# Patient Record
Sex: Female | Born: 1975 | Race: Black or African American | Hispanic: No | Marital: Single | Smoking: Former smoker
Health system: Southern US, Community
[De-identification: ages and names within clinical notes are randomized; demographics above are authoritative.]

## PROBLEM LIST (undated history)

## (undated) DIAGNOSIS — E876 Hypokalemia: Secondary | ICD-10-CM

## (undated) DIAGNOSIS — D509 Iron deficiency anemia, unspecified: Secondary | ICD-10-CM

## (undated) DIAGNOSIS — F419 Anxiety disorder, unspecified: Secondary | ICD-10-CM

## (undated) DIAGNOSIS — Z8489 Family history of other specified conditions: Secondary | ICD-10-CM

## (undated) DIAGNOSIS — C539 Malignant neoplasm of cervix uteri, unspecified: Secondary | ICD-10-CM

## (undated) HISTORY — DX: Malignant neoplasm of cervix uteri, unspecified: C53.9

## (undated) HISTORY — PX: OTHER SURGICAL HISTORY: SHX169

## (undated) HISTORY — PX: TUBAL LIGATION: SHX77

---

## 2014-02-15 ENCOUNTER — Encounter: Payer: Self-pay | Admitting: Gynecologic Oncology

## 2014-02-15 ENCOUNTER — Ambulatory Visit: Payer: Medicaid - Out of State | Attending: Gynecologic Oncology | Admitting: Gynecologic Oncology

## 2014-02-15 VITALS — BP 103/59 | HR 84 | Temp 98.0°F | Resp 16 | Ht 62.0 in | Wt 102.9 lb

## 2014-02-15 DIAGNOSIS — Z87891 Personal history of nicotine dependence: Secondary | ICD-10-CM | POA: Diagnosis not present

## 2014-02-15 DIAGNOSIS — D069 Carcinoma in situ of cervix, unspecified: Secondary | ICD-10-CM

## 2014-02-15 DIAGNOSIS — C539 Malignant neoplasm of cervix uteri, unspecified: Secondary | ICD-10-CM

## 2014-02-15 DIAGNOSIS — R87613 High grade squamous intraepithelial lesion on cytologic smear of cervix (HGSIL): Secondary | ICD-10-CM | POA: Diagnosis present

## 2014-02-15 DIAGNOSIS — Z793 Long term (current) use of hormonal contraceptives: Secondary | ICD-10-CM | POA: Diagnosis not present

## 2014-02-15 DIAGNOSIS — D495 Neoplasm of unspecified behavior of other genitourinary organs: Secondary | ICD-10-CM | POA: Diagnosis not present

## 2014-02-15 DIAGNOSIS — F1290 Cannabis use, unspecified, uncomplicated: Secondary | ICD-10-CM | POA: Insufficient documentation

## 2014-02-15 NOTE — Patient Instructions (Addendum)
You are scheduled for a PET scan on 02/25/14 at 12pm at [facility]. Please arrive at 11:45am. Nothing to eat or drink after 6am on the morning of 02/25/14.   You will receive a phone call from the [facility] to arrange an appointment to see Dr. Marko Plume who would administer chemotherapy.  We will notify you with the results of your biopsy and PET scan.  Please call for any questions or concerns.  Plan to see Dr. Sondra Come in Radiation on Wed, Jan 20 at 1:30pm.  Arrive at 1:15pm to register.

## 2014-02-15 NOTE — Progress Notes (Signed)
Consult Note: Gyn-Onc  Consult was requested by Dr. Evie Lacks for the evaluation of Erika [name] 39 y.o. female with vaginal bleeding and a cervical mass  CC:  Chief Complaint  Patient presents with  . severe cervical dysplasia    Assessment/Plan:  [name]  is a 39 y.o.  year old with apparent stage IIB cervical cancer.  We are awaiting the confirmatory biopsies that I performed today. I discussed with the patient that given the left-sided parametrial involvement and the bulky nature of her tumor she is not a candidate for primary surgical resection. Instead I am recommending treatment with primary chemoradiation with curative intent.  PET CT to evaluate for metastatic disease.  Referrals to radiation oncology and Dr. Marko Plume (medical oncology) to facilitate treatment planning.  I discussed with [place] that provided her tumor appears to be confined to the pelvis, that prognosis can be good with primary chemoradiation. I discussed that fertility will not be able to be spared with this therapy and she is understanding and accepting of this and she is completed childbearing. I discussed the potential for long-term side effects with radiation therapy and that these will be further discussed with her by her treating physician.   HPI: Erika [name] is a 39 year old G2 P2 who was seen in consultation at the request of Dr. Evie Lacks for high-grade cervical dysplasia and a cervical mass. She was seen in the emergency room on 01/30/2014 for vaginal hemorrhage. A transvaginal ultrasound was performed  and revealed the uterus with fibroids measuring in total 11.6 x 9 x 9 cm, and a solid mass noted in the region of the cervix measuring 5.7 x 3.8 x 4 cm. The patient received a blood transfusion for her symptomatic anemia. A biopsy was taken of the cervical mass. Biopsy revealed at least high-grade dysplasia, however the cause of the tangential cart on the biopsy frank invasion could not be either ruled in or  out.   Interval History: Since her admission to the hospital she has had mostly spotting in discharge and no additional heavy bleeding. She denies pain, lower extremity edema, or back pain. Her last Pap smear was 14 years ago. She denies a history of abnormal cervical cytology or cervical procedures.    Current Meds:  Outpatient Encounter Prescriptions as of 02/15/2014  Medication Sig  . ibuprofen (ADVIL,MOTRIN) 200 MG tablet Take 200 mg by mouth every 6 (six) hours as needed.  . medroxyPROGESTERone (PROVERA) 10 MG tablet Take 10 mg by mouth daily.    Allergy: No Known Allergies  Social Hx:   History   Social History  . Marital Status: Single    Spouse Name: N/A    Number of Children: N/A  . Years of Education: N/A   Occupational History  . Not on file.   Social History Main Topics  . Smoking status: Former Smoker    Quit date: 08/02/2011  . Smokeless tobacco: Not on file  . Alcohol Use: Yes     Comment: socially  . Drug Use: Yes    Special: Marijuana  . Sexual Activity: Not Currently     Comment: per pt, she hasn't been sexually active in 4 months due to bleeding   Other Topics Concern  . Not on file   Social History Narrative  . No narrative on file    Past Surgical Hx: History reviewed. No pertinent past surgical history.  Past Medical Hx: History reviewed. No pertinent past medical history.  Past Gynecological History:   G2  P2 with 1 SV D and one cesarean section. She's had a tubal ligation at the time of cesarean section. Last Pap smear was in 2002. She is no history of abnormal cytology. No LMP recorded.  Family Hx: History reviewed. No pertinent family history.  Review of Systems:  Constitutional  Feels well,    ENT Normal appearing ears and nares bilaterally Skin/Breast  No rash, sores, jaundice, itching, dryness Cardiovascular  No chest pain, shortness of breath, or edema  Pulmonary  No cough or wheeze.  Gastro Intestinal  No nausea,  vomitting, or diarrhoea. No bright red blood per rectum, no abdominal pain, change in bowel movement, or constipation.  Genito Urinary  No frequency, urgency, dysuria, see HPI Musculo Skeletal  No myalgia, arthralgia, joint swelling or pain  Neurologic  No weakness, numbness, change in gait,  Psychology  No depression, anxiety, insomnia.   Vitals:  Blood pressure 103/59, pulse 84, temperature 98 F (36.7 C), temperature source Oral, resp. rate 16, height 5\' 2"  (1.575 m), weight 102 lb 14.4 oz (46.675 kg).  Physical Exam: WD in NAD Neck  Supple NROM, without any enlargements.  Lymph Node Survey No cervical supraclavicular or inguinal adenopathy Cardiovascular  Pulse normal rate, regularity and rhythm. S1 and S2 normal.  Lungs  Clear to auscultation bilateraly, without wheezes/crackles/rhonchi. Good air movement.  Skin  No rash/lesions/breakdown  Psychiatry  Alert and oriented to person, place, and time  Abdomen  Normoactive bowel sounds, abdomen soft, non-tender and thin without evidence of hernia.  Back No CVA tenderness Genito Urinary  Vulva/vagina: Normal external female genitalia.  No lesions. No discharge or bleeding.  Bladder/urethra:  No lesions or masses, well supported bladder  Vagina: no lesions  Cervix:  replaced by friable mass, measuring 6 x 6 cm. Slight discharge from cervix no frank bleeding.  Uterus:  Moderate size, mobile, no parametrial involvement or nodularity.  Adnexa: no palpable masses. Rectal  Good tone, no masses, + parametrial infiltration on right. Does not extend to sidewall Extremities  No bilateral cyanosis, clubbing or edema.   Donaciano Eva, MD    CC: Dr Evie Lacks 02/15/2014, 4:26 PM

## 2014-02-18 ENCOUNTER — Encounter: Payer: Self-pay | Admitting: Radiation Oncology

## 2014-02-18 ENCOUNTER — Telehealth: Payer: Self-pay | Admitting: Oncology

## 2014-02-18 NOTE — Telephone Encounter (Signed)
S/W PATIENT AND GAVE NP APPT FOR 02/4 @ 10:30 W/DR. LIVESAY, CHEMO EDU 01/25 @ 10

## 2014-02-18 NOTE — Progress Notes (Addendum)
GYN Location of Tumor / Histology: stage IIB cervical cancer   Erika [name] presented, per Dr. Denman George "to the emergency room in [place] on 01/30/2014 for vaginal hemorrhage. A transvaginal ultrasound was performed and revealed the uterus with fibroids measuring in total 11.6 x 9 x 9 cm, and a solid mass noted in the region of the cervix measuring 5.7 x 3.8 x 4 cm. The patient received a blood transfusion for her symptomatic anemia.  Biopsies revealed:   02/15/14 Diagnosis Cervix, biopsy - SQUAMOUS CELL CARCINOMA  Past/Anticipated interventions by Gyn/Onc surgery, if any: Per Dr. Denman George "given the left-sided parametrial involvement and the bulky nature of her tumor she is not a candidate for primary surgical resection."  Past/Anticipated interventions by medical oncology, if any: apt with Dr. Marko Plume on 03/07/14.  Past Gynecological History: G2 P2 with 1 SV D and one cesarean section. She's had a tubal ligation at the time of cesarean section. Last Pap smear was in 2002. She is no history of abnormal cytology.  Weight changes, if any: no  Bowel/Bladder complaints, if any: denies bowel and bladder issues.  Nausea/Vomiting, if any: no  Pain issues, if any:  yes, cramping in her lower abdominal/pelvic area.  She rates it at a 9/10 today.  She takes ibuprofen as needed.  She reports that it keeps her up at night.  SAFETY ISSUES:  Prior radiation? no  Pacemaker/ICD? no  Possible current pregnancy? no  Is the patient on methotrexate? no  Current Complaints / other details:  She has a PET scan scheduled for 02/25/14.  Patient is here with her fiance.  She reports having spotting after her last exam on 02/15/14.  It stopped on Sunday and started again yesterday.  She believes that her period may be starting.  BP 111/58 mmHg  Pulse 94  Temp(Src) 98.1 F (36.7 C) (Oral)  Resp 16  Ht 5\' 2"  (1.575 m)  Wt 105 lb 3.2 oz (47.718 kg)  BMI 19.24 kg/m2  LMP 02/20/2014 (Approximate)

## 2014-02-20 ENCOUNTER — Ambulatory Visit
Admission: RE | Admit: 2014-02-20 | Discharge: 2014-02-20 | Disposition: A | Discharge status: Home / Self Care | Payer: Medicaid - Out of State | Source: Ambulatory Visit | Attending: Radiation Oncology | Admitting: Radiation Oncology

## 2014-02-20 ENCOUNTER — Encounter: Payer: Self-pay | Admitting: Radiation Oncology

## 2014-02-20 VITALS — BP 111/58 | HR 94 | Temp 98.1°F | Resp 16 | Ht 62.0 in | Wt 105.2 lb

## 2014-02-20 DIAGNOSIS — F1210 Cannabis abuse, uncomplicated: Secondary | ICD-10-CM | POA: Insufficient documentation

## 2014-02-20 DIAGNOSIS — D259 Leiomyoma of uterus, unspecified: Secondary | ICD-10-CM | POA: Diagnosis not present

## 2014-02-20 DIAGNOSIS — F1010 Alcohol abuse, uncomplicated: Secondary | ICD-10-CM | POA: Diagnosis not present

## 2014-02-20 DIAGNOSIS — C539 Malignant neoplasm of cervix uteri, unspecified: Secondary | ICD-10-CM | POA: Insufficient documentation

## 2014-02-20 DIAGNOSIS — Z793 Long term (current) use of hormonal contraceptives: Secondary | ICD-10-CM | POA: Diagnosis not present

## 2014-02-20 DIAGNOSIS — Z510 Encounter for antineoplastic radiation therapy: Secondary | ICD-10-CM | POA: Diagnosis present

## 2014-02-20 DIAGNOSIS — D649 Anemia, unspecified: Secondary | ICD-10-CM | POA: Insufficient documentation

## 2014-02-20 DIAGNOSIS — Z8541 Personal history of malignant neoplasm of cervix uteri: Secondary | ICD-10-CM | POA: Insufficient documentation

## 2014-02-20 DIAGNOSIS — Z87891 Personal history of nicotine dependence: Secondary | ICD-10-CM | POA: Insufficient documentation

## 2014-02-20 HISTORY — DX: Malignant neoplasm of cervix uteri, unspecified: C53.9

## 2014-02-20 LAB — COMPREHENSIVE METABOLIC PANEL (CC13)
ALT: 9 U/L (ref 0–55)
ANION GAP: 5 meq/L (ref 3–11)
AST: 14 U/L (ref 5–34)
Albumin: 3.3 g/dL — ABNORMAL LOW (ref 3.5–5.0)
Alkaline Phosphatase: 67 U/L (ref 40–150)
BUN: 9 mg/dL (ref 7.0–26.0)
CALCIUM: 8.7 mg/dL (ref 8.4–10.4)
CHLORIDE: 107 meq/L (ref 98–109)
CO2: 25 meq/L (ref 22–29)
Creatinine: 0.6 mg/dL (ref 0.6–1.1)
EGFR: >90 mL/min/{1.73_m2} (ref >90)
Glucose: 89 mg/dl (ref 70–140)
Potassium: 3.2 mEq/L — ABNORMAL LOW (ref 3.5–5.1)
Sodium: 138 mEq/L (ref 136–145)
TOTAL PROTEIN: 6.9 g/dL (ref 6.4–8.3)
Total Bilirubin: 0.22 mg/dL (ref 0.20–1.20)

## 2014-02-20 LAB — CBC WITH DIFFERENTIAL/PLATELET
BASO%: 0.5 % (ref 0.0–2.0)
Basophils Absolute: 0 10*3/uL (ref 0.0–0.1)
EOS%: 0.6 % (ref 0.0–7.0)
Eosinophils Absolute: 0 10*3/uL (ref 0.0–0.5)
HEMATOCRIT: 25 % — AB (ref 34.8–46.6)
HGB: 7.1 g/dL — ABNORMAL LOW (ref 11.6–15.9)
LYMPH#: 1.5 10*3/uL (ref 0.9–3.3)
LYMPH%: 27.4 % (ref 14.0–49.7)
MCH: 22.9 pg — ABNORMAL LOW (ref 25.1–34.0)
MCHC: 28.5 g/dL — ABNORMAL LOW (ref 31.5–36.0)
MCV: 80.3 fL (ref 79.5–101.0)
MONO#: 0.6 10*3/uL (ref 0.1–0.9)
MONO%: 11.5 % (ref 0.0–14.0)
NEUT#: 3.2 10*3/uL (ref 1.5–6.5)
NEUT%: 60 % (ref 38.4–76.8)
PLATELETS: 344 10*3/uL (ref 145–400)
RBC: 3.11 10*6/uL — AB (ref 3.70–5.45)
RDW: 25.1 % — ABNORMAL HIGH (ref 11.2–14.5)
WBC: 5.4 10*3/uL (ref 3.9–10.3)

## 2014-02-20 LAB — MAGNESIUM (CC13): Magnesium: 2.1 mg/dl (ref 1.5–2.5)

## 2014-02-20 NOTE — Progress Notes (Signed)
Radiation Oncology         [phone] ________________________________  Initial Outpatient Consultation  Name: Erika [name] MRN: [id number]  Date: 02/20/2014  DOB: 03-07-1975  CC:No primary care provider on file.  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 female who is seen out courtesy of Dr. Denman George for an opinion concerning radiation therapy as part of management of patient's recently diagnosed cervical cancer. On December 30 patient presented to the emergency room with vaginal hemorrhage. She was seen by Dr Evie Lacks and exam at that time revealed a cervical mass. A biopsy was performed which revealed high-grade cervical dysplasia. Trans vaginal ultrasound revealed uterine fibroids and a solid mass in the region of the cervix measuring approximately 5.7 x 3.8 x 4 cm. Patient did receive a blood transfusion for her anemia at that time. Patient was referred to Dr Denman George and on January 15 patient underwent an examination. The patient was found to have 6 x 6 friable mass replacing the cervix. On rectovaginal examination patient was noted to have right parametrial infiltration without any sidewall involvement. In light of the clinical findings the patient was not felt to be a candidate for radical hysterectomy. She is now referred to radiation oncology and to medical oncology for definitive treatment. Staging workup will be complete after PET scan on January 25.  PREVIOUS RADIATION THERAPY: No  PAST MEDICAL HISTORY:  has a past medical history of Cervical cancer.    PAST SURGICAL HISTORY: Past Surgical History  Procedure Laterality Date  . Tubal ligation    . Cesarean section      FAMILY HISTORY: family history is not on file.  SOCIAL HISTORY:  reports that she quit smoking about 2 years ago. Her smoking use included Cigarettes. She has a 2 pack-year  smoking history. She does not have any smokeless tobacco history on file. She reports that she drinks about 0.6 oz of alcohol per week. She reports that she uses illicit drugs (Marijuana) about twice per week. the patient works full-time at one of the [employer] locations in [place]. The patient does live in [place]. she is accompanied by her boyfriend on evaluation today. Patient works from 4 am in the morning until approximately 12-1pm daily.  ALLERGIES: Review of patient's allergies indicates no known allergies.  MEDICATIONS:  Current Outpatient Prescriptions  Medication Sig Dispense Refill  . ferrous sulfate 325 (65 FE) MG tablet Take 325 mg by mouth daily with breakfast.    . ibuprofen (ADVIL,MOTRIN) 200 MG tablet Take 200 mg by mouth every 6 (six) hours as needed.    . medroxyPROGESTERone (PROVERA) 10 MG tablet Take 10 mg by mouth daily.     No current facility-administered medications for this encounter.    REVIEW OF SYSTEMS:  A 15 point review of systems is documented in the electronic medical record. This was obtained by the nursing staff. However, I reviewed this with the patient to discuss relevant findings and make appropriate changes.  She has had some mild discomfort in the left lower pelvis area. She denies any nausea or abdominal cramping. Patient has had some mild vaginal bleeding which she attributes to her cycle at this time. Patient feels stronger since receiving her blood transfusion. She denies any significant episodes of bleeding since her biopsy. Patient's appetite is good and her energy level is good. She denies  any back or flank pain.   PHYSICAL EXAM:  height is 5\' 2"  (1.575 m) and weight is 105 lb 3.2 oz (47.718 kg). Her oral temperature is 98.1 F (36.7 C). Her blood pressure is 111/58 and her pulse is 94. Her respiration is 16.   BP 111/58 mmHg  Pulse 94  Temp(Src) 98.1 F (36.7 C) (Oral)  Resp 16  Ht 5\' 2"  (1.575 m)  Wt 105 lb 3.2 oz (47.718 kg)   BMI 19.24 kg/m2  LMP 02/20/2014 (Approximate)  General Appearance:    Alert, cooperative, no distress, appears stated age  Head:    Normocephalic, without obvious abnormality, atraumatic  Eyes:    PERRL, conjunctiva/corneas clear, EOM's intact,    Ears:    Normal TM's and external ear canals, both ears  Nose:   Nares normal, septum midline, mucosa normal, no drainage    or sinus tenderness  Throat:   Lips, mucosa, and tongue normal; several teeth missing and gums normal  Neck:   Supple, symmetrical, trachea midline, no adenopathy;    thyroid:  no enlargement/tenderness/nodules; no carotid   bruit or JVD  Back:     Symmetric, no curvature, ROM normal, no CVA tenderness  Lungs:     Clear to auscultation bilaterally, respirations unlabored  Chest Wall:    No tenderness or deformity   Heart:    Regular rate and rhythm, S1 and S2 normal, no murmur, rub   or gallop     Abdomen:     Soft, non-tender, bowel sounds active all four quadrants,    no masses, no organomegaly  Genitalia:    on pelvic examination the external genitalia are unremarkable. A speculum exam is performed. There is some serosanguineous drainage noted in the vaginal vault but no active bleeding. The upper vaginal area is replaced by a friable large mass which on palpation is estimated to be approximately 6 x 6 cm in size.  On rectovaginal examination there appears to be right parametrial infiltration. The sidewalls are clear.   Rectal:    Normal tone,  no masses or tenderness;     Extremities:   Extremities normal, atraumatic, no cyanosis or edema  Pulses:   2+ and symmetric all extremities  Skin:   Skin color, texture, turgor normal, no rashes or lesions  Lymph nodes:   Cervical, supraclavicular, and axillary nodes normal  Neurologic:   normal strength, sensation and reflexes    throughout     ECOG = 1    1 - Symptomatic but completely ambulatory (Restricted in physically strenuous activity but ambulatory and able to  carry out work of a light or sedentary nature. For example, light housework, office work)  LABORATORY DATA:  No results found for: WBC, HGB, HCT, MCV, PLT, NEUTROABS No results found for: NA, K, CL, CO2, GLUCOSE, CREATININE, CALCIUM    RADIOGRAPHY: PET scan scheduled for January 25    IMPRESSION:  Clinical stage II-B squamous cell carcinoma of cervix. The patient would be a good candidate for a definitive course of radiation therapy along with radiosensitizing chemotherapy. The patient's radiation therapy would consist of external beam radiation therapy directed at the pelvis area with a boost planned to the right pelvic sidewall area. The patient would also proceed with intracavitary brachii therapy treatments as part of her overall management. I discussed high-dose rate brachytherapy treatments hear in [place] as well as low dose rate cesium treatment at [facility]. The patient appears to be leaning towards high-dose rate treatments  as part of her brachii therapy management. I discussed treatment course side effects and potential toxicities of radiation therapy in this situation with the patient. She appears to understand and wishes to proceed with planned course of treatment.    PLAN: Given the patient's residence in [place] she will likely receive her external beam radiation therapy at the [facility] in [place]. Patient will return to [facility] for her intracavitary brachytherapy treatments as well as her radiosensitizing chemotherapy anticipated on a weekly basis during her external beam treatments.   I spent 60 minutes minutes face to face with the patient and more than 50% of that time was spent in counseling and/or coordination of care.   ------------------------------------------------  Blair Promise, PhD, MD

## 2014-02-20 NOTE — Progress Notes (Signed)
Please see the Nurse Progress Note in the MD Initial Consult Encounter for this patient. 

## 2014-02-21 ENCOUNTER — Telehealth: Payer: Self-pay | Admitting: Oncology

## 2014-02-21 NOTE — Telephone Encounter (Signed)
Called Erika [name] regarding her lab work.  Advised her to eat bananas for her low potassium per Dr. Sondra Come.  Also let her know that she is anemic.  Erika [name] verbalized understanding and said that she would eat bananas and continue taking her iron pills for the anemia.

## 2014-02-25 ENCOUNTER — Encounter (HOSPITAL_COMMUNITY): Payer: Self-pay

## 2014-02-25 ENCOUNTER — Other Ambulatory Visit: Payer: Self-pay

## 2014-02-25 ENCOUNTER — Ambulatory Visit (HOSPITAL_COMMUNITY)
Admission: RE | Admit: 2014-02-25 | Discharge: 2014-02-25 | Disposition: A | Discharge status: Home / Self Care | Payer: Medicaid - Out of State | Source: Ambulatory Visit | Attending: Gynecologic Oncology | Admitting: Gynecologic Oncology

## 2014-02-25 DIAGNOSIS — C539 Malignant neoplasm of cervix uteri, unspecified: Secondary | ICD-10-CM | POA: Diagnosis not present

## 2014-02-25 LAB — GLUCOSE, CAPILLARY: Glucose-Capillary: 91 mg/dL (ref 70–99)

## 2014-02-25 MED ORDER — FLUDEOXYGLUCOSE F - 18 (FDG) INJECTION
5.2700 | Freq: Once | INTRAVENOUS | Status: AC | PRN
  Administered 2014-02-25: 5.27 via INTRAVENOUS

## 2014-02-26 DIAGNOSIS — D509 Iron deficiency anemia, unspecified: Secondary | ICD-10-CM | POA: Insufficient documentation

## 2014-02-26 DIAGNOSIS — E876 Hypokalemia: Secondary | ICD-10-CM | POA: Insufficient documentation

## 2014-03-01 ENCOUNTER — Telehealth: Payer: Self-pay | Admitting: Oncology

## 2014-03-01 NOTE — Telephone Encounter (Signed)
Erika [name] called and wanted to make Dr. Sondra Come aware that she is having a small amount of abnormal vaginal bleeding that started yesterday.  She said that the bleeding is "off and on."  She said she knows to go to the ER if the bleeding increases.  She said she went to work for the first time yesterday and thinks that this is the cause of the bleeding.  She has an appointment with Dr. Sondra Come in [place] on Monday.

## 2014-03-07 ENCOUNTER — Other Ambulatory Visit: Payer: Self-pay

## 2014-03-07 ENCOUNTER — Ambulatory Visit: Payer: Self-pay

## 2014-03-07 ENCOUNTER — Encounter: Payer: Self-pay | Admitting: *Deleted

## 2014-03-07 ENCOUNTER — Ambulatory Visit: Payer: Self-pay | Admitting: Oncology

## 2014-03-07 NOTE — Progress Notes (Signed)
[place] Psychosocial Distress Screening Clinical Social Work  Clinical Social Work was referred by distress screening protocol.  The patient scored a 5 on the Psychosocial Distress Thermometer which indicates moderate distress. Clinical Social Worker attempted to contact patient by phone to assess for distress and other psychosocial needs.  Patient's spouse answered, patient unavailable.  ON[facility]N DISTRESS SCREENING 02/20/2014  Screening Type Initial Screening  Distress experienced in past week (1-10) 5  Practical problem type Work/school  Emotional problem type Nervousness/Anxiety  Physical Problem type Pain;Sleep/insomnia  Physician notified of physical symptoms Yes   Clinical Social Worker follow up needed: Yes.    If yes, follow up plan:  CSW will call patient back at a later time.  Polo Riley, MSW, LCSW, OSW-C Clinical Social Worker [facility] [phone]

## 2014-04-01 NOTE — Progress Notes (Signed)
Please put orders in Epic surgery 04-09-14 pre op 04-05-14 Thanks

## 2014-04-02 ENCOUNTER — Other Ambulatory Visit: Payer: Self-pay | Admitting: Radiation Oncology

## 2014-04-02 ENCOUNTER — Encounter: Payer: Self-pay | Admitting: Radiation Oncology

## 2014-04-02 DIAGNOSIS — C539 Malignant neoplasm of cervix uteri, unspecified: Secondary | ICD-10-CM

## 2014-04-02 NOTE — Progress Notes (Signed)
Clld patient who said she has [place] Medicaid [id number] prior auth req on radiation treatment. (lw)

## 2014-04-04 ENCOUNTER — Other Ambulatory Visit: Payer: Self-pay | Admitting: Radiation Oncology

## 2014-04-04 ENCOUNTER — Other Ambulatory Visit (HOSPITAL_BASED_OUTPATIENT_CLINIC_OR_DEPARTMENT_OTHER): Payer: Self-pay | Admitting: Radiation Oncology

## 2014-04-04 NOTE — H&P (Signed)
Radiation Oncology         [phone] ________________________________  History and physical examination note  Name: Erika [name] MRN: [id number]  Date: 03/29/2014  DOB: 1975-10-16  XL:KGMWNUUVO,ZDGU S, MD  No ref. provider found   REFERRING PHYSICIAN: No ref. 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 female who is currently under treatment with external beam radiation therapy at the [facility] in [place].  the patient will be admitted on March 8 for exam under CT anesthesia and placement of a tandem/ringing in preparation for high-dose rate radiation therapy using iridium 192.  patient is tolerating her radiation and radiosensitizing chemotherapy well. She is completed 16 treatments as of February 29 (2880 cGy)  the patient's vaginal bleeding has subsided.. Pelvic exam shows a nice shrinkage of her large cervical mass.  PREVIOUS RADIATION THERAPY: No  PAST MEDICAL HISTORY:  has a past medical history of Cervical cancer.    PAST SURGICAL HISTORY: Past Surgical History  Procedure Laterality Date  . Tubal ligation    . Cesarean section      FAMILY HISTORY: family history is not on file.  SOCIAL HISTORY:  reports that she quit smoking about 2 years ago. Her smoking use included Cigarettes. She has a 2 pack-year smoking history. She does not have any smokeless tobacco history on file. She reports that she drinks about 0.6 oz of alcohol per week. She reports that she uses illicit drugs (Marijuana) about twice per week.  ALLERGIES: Review of patient's allergies indicates no known allergies.  MEDICATIONS:  No current facility-administered medications for this encounter.   Current Outpatient Prescriptions  Medication Sig Dispense Refill  . dexamethasone (DECADRON) 4 MG tablet Take 4 mg by mouth 2 (two) times daily with a meal.    . ferrous sulfate 325 (65 FE) MG tablet Take 325 mg by  mouth daily with breakfast.    . HYDROcodone-acetaminophen (NORCO/VICODIN) 5-325 MG per tablet Take 1 tablet by mouth every 6 (six) hours as needed for moderate pain.    Marland Kitchen LORazepam (ATIVAN) 0.5 MG tablet Take 0.5 mg by mouth every 6 (six) hours as needed (nausea).     . potassium chloride (K-DUR,KLOR-CON) 10 MEQ tablet Take 20 mEq by mouth 3 (three) times daily.    . promethazine (PHENERGAN) 25 MG tablet Take 25 mg by mouth every 6 (six) hours as needed for nausea or vomiting.      REVIEW OF SYSTEMS:  A 15 point review of systems is documented in the electronic medical record. This was obtained by the nursing staff. However, I reviewed this with the patient to discuss relevant findings and make appropriate changes.  She has had some mild nausea with her chemotherapy. No significant problems with loose bowels or diarrhea. The patient has had some problems with low potassium and is on a potassium supplement for this issue   PHYSICAL EXAM: Vital signs temperature 98.4, weight 99 pounds blood pressure 108/69 pulse 95 oxygen saturation 99% on room air. These all vitals are from exam on the 29th   General Appearance:    Alert, cooperative, no distress, appears stated age  Head:    Normocephalic, without obvious abnormality, atraumatic  Eyes:    PERRL, conjunctiva/corneas clear, EOM's intact, f     Ears:    Normal TM's and external ear canals, both ears  Nose:   Nares normal, septum midline, mucosa normal, no drainage  or sinus tenderness  Throat:   Lips, mucosa, and tongue normal; teeth and gums normal  Neck:   Supple, symmetrical, trachea midline, no adenopathy;    thyroid:  no enlargement/tenderness/nodules; no carotid   bruit or JVD  Back:     Symmetric, no curvature, ROM normal, no CVA tenderness  Lungs:     Clear to auscultation bilaterally, respirations unlabored  Chest Wall:    No tenderness or deformity   Heart:    Regular rate and rhythm, S1 and S2 normal, no murmur, rub   or gallop      Abdomen:     Soft, non-tender, bowel sounds active all four quadrants,    no masses, no organomegaly  Genitalia:    Normal female without lesion, discharge or tenderness, cervical mass smaller in size estimated to be approximately 3.5 x 4 cm      Extremities:   Extremities normal, atraumatic, no cyanosis or edema  Pulses:   2+ and symmetric all extremities  Skin:   Skin color, texture, turgor normal, no rashes or lesions  Lymph nodes:   Cervical, supraclavicular, and axillary nodes normal  Neurologic:    normal strength, sensation and reflexes    throughout     ECOG = 1    1 - Symptomatic but completely ambulatory (Restricted in physically strenuous activity but ambulatory and able to carry out work of a light or sedentary nature. For example, light housework, office work)  LABORATORY DATA:  Lab Results  Component Value Date   WBC 5.4 02/20/2014   HGB 7.1* 02/20/2014   HCT 25.0* 02/20/2014   MCV 80.3 02/20/2014   PLT 344 02/20/2014   NEUTROABS 3.2 02/20/2014   Lab Results  Component Value Date   NA 138 02/20/2014   K 3.2* 02/20/2014   CO2 25 02/20/2014   GLUCOSE 89 02/20/2014   CREATININE 0.6 02/20/2014   CALCIUM 8.7 02/20/2014   Laboratory data from [facility] February 29th scanned in  RADIOGRAPHY: No results found.    IMPRESSION: Stage IIB squamous cell carcinoma cervix. Patient is now ready to proceed with high-dose rate brachytherapy as part of her overall management. Patient will be taken to the operating room on March 8 for her first procedure.  The patient was explained this procedure potential complications and side effects and wishes to proceed with this as part of her treatment. ------------------------------------------------  Blair Promise, PhD, MD

## 2014-04-05 ENCOUNTER — Encounter (HOSPITAL_COMMUNITY)
Admission: RE | Admit: 2014-04-05 | Discharge: 2014-04-05 | Disposition: A | Discharge status: Home / Self Care | Payer: Medicaid - Out of State | Source: Ambulatory Visit | Attending: Radiation Oncology | Admitting: Radiation Oncology

## 2014-04-05 ENCOUNTER — Encounter (HOSPITAL_COMMUNITY): Payer: Self-pay

## 2014-04-05 ENCOUNTER — Telehealth: Payer: Self-pay | Admitting: Oncology

## 2014-04-05 DIAGNOSIS — Z01812 Encounter for preprocedural laboratory examination: Secondary | ICD-10-CM | POA: Diagnosis not present

## 2014-04-05 DIAGNOSIS — C539 Malignant neoplasm of cervix uteri, unspecified: Secondary | ICD-10-CM | POA: Insufficient documentation

## 2014-04-05 HISTORY — DX: Anxiety disorder, unspecified: F41.9

## 2014-04-05 HISTORY — DX: Iron deficiency anemia, unspecified: D50.9

## 2014-04-05 HISTORY — DX: Hypokalemia: E87.6

## 2014-04-05 HISTORY — DX: Family history of other specified conditions: Z84.89

## 2014-04-05 LAB — BASIC METABOLIC PANEL
Anion gap: 9 (ref 5–15)
BUN: 16 mg/dL (ref 6–23)
CHLORIDE: 105 mmol/L (ref 96–112)
CO2: 29 mmol/L (ref 19–32)
Calcium: 9 mg/dL (ref 8.4–10.5)
Creatinine, Ser: 0.66 mg/dL (ref 0.50–1.10)
GFR calc Af Amer: >90 mL/min (ref >90)
Glucose, Bld: 75 mg/dL (ref 70–99)
Potassium: 2.6 mmol/L — CL (ref 3.5–5.1)
SODIUM: 143 mmol/L (ref 135–145)

## 2014-04-05 LAB — URINALYSIS, ROUTINE W REFLEX MICROSCOPIC
Bilirubin Urine: NEGATIVE
Glucose, UA: NEGATIVE mg/dL
HGB URINE DIPSTICK: NEGATIVE
Ketones, ur: NEGATIVE mg/dL
LEUKOCYTES UA: NEGATIVE
Nitrite: NEGATIVE
PROTEIN: NEGATIVE mg/dL
Specific Gravity, Urine: 1.014 (ref 1.005–1.030)
Urobilinogen, UA: 0.2 mg/dL (ref 0.0–1.0)
pH: 6 (ref 5.0–8.0)

## 2014-04-05 LAB — CBC WITH DIFFERENTIAL/PLATELET
BASOS PCT: 0 % (ref 0–1)
Basophils Absolute: 0 10*3/uL (ref 0.0–0.1)
EOS PCT: 0 % (ref 0–5)
Eosinophils Absolute: 0 10*3/uL (ref 0.0–0.7)
HCT: 31.1 % — ABNORMAL LOW (ref 36.0–46.0)
Hemoglobin: 9.6 g/dL — ABNORMAL LOW (ref 12.0–15.0)
Lymphocytes Relative: 4 % — ABNORMAL LOW (ref 12–46)
Lymphs Abs: 0.2 10*3/uL — ABNORMAL LOW (ref 0.7–4.0)
MCH: 28.1 pg (ref 26.0–34.0)
MCHC: 30.9 g/dL (ref 30.0–36.0)
MCV: 90.9 fL (ref 78.0–100.0)
Monocytes Absolute: 0.4 10*3/uL (ref 0.1–1.0)
Monocytes Relative: 9 % (ref 3–12)
NEUTROS PCT: 87 % — AB (ref 43–77)
Neutro Abs: 4.2 10*3/uL (ref 1.7–7.7)
PLATELETS: 133 10*3/uL — AB (ref 150–400)
RBC: 3.42 MIL/uL — ABNORMAL LOW (ref 3.87–5.11)
RDW: 20.3 % — AB (ref 11.5–15.5)
WBC: 4.8 10*3/uL (ref 4.0–10.5)

## 2014-04-05 LAB — APTT: APTT: 21 s — AB (ref 24–37)

## 2014-04-05 LAB — PROTIME-INR
INR: 0.86 (ref 0.00–1.49)
Prothrombin Time: 11.8 seconds (ref 11.6–15.2)

## 2014-04-05 LAB — HCG, SERUM, QUALITATIVE: Preg, Serum: NEGATIVE

## 2014-04-05 NOTE — Progress Notes (Signed)
PET scan per epic with lungs mentioned 02/25/2014 OV note from [facility] per chart 02/26/2014 and 03/19/2014 Everardo All MD  OV note per chart Dr Sondra Come 03/04/2014 and 03/21/2014

## 2014-04-05 NOTE — Progress Notes (Signed)
Need clarification to consent. Abbreviations used. Thanks.

## 2014-04-05 NOTE — Telephone Encounter (Signed)
At 11:47, Florentina Jenny from [facility] [facility] called with critical potassium of 2.6 for Erika [name] along with a HGB of 9.6 and pTT of 21  Called Erika [name] to verify if she is still taking potassium.  Per Erika [name], she is taking 6 10 meq tablets per day but forgot to take them last night and this morning.  Called Erika [name] to advise him of the potassium and lab results.  Per Erika [name], call Erika [name] and make sure she takes her potassium as directed (20 meq 3 times a day) over the weekend and to eat bananas.  He will check her potassium in [place] on Monday. He also said to tell her to go the ER if she feels sick or if her heart is racing. Called Erika [name] back and left a message for a return call.  Per Erika [name] request, called the [facility] OR and asked Erika [name] with anesthesiology what a patient's potassium level has to be for procedures in the OR.  Per Erika [name], they prefer it to be 2.9 or higher.  Called Erika [name] back and let him know.  Was able to reach Erika [name] on her cell phone.  Advised her to make sure to take her potassium pills as directed (two 10 meq tablets 3 times a day)and  to eat bananas over the weekend.  Advised her to go to the ER if she feels sick or like her heart is racing.  Also advised her that she will have labs drawn Monday morning in [place] to recheck her potassium.  Erika [name] verbalized understanding and said she was not surprised her potassium was low.  She said she usually runs "in the two's."

## 2014-04-05 NOTE — Progress Notes (Signed)
CRITICAL VALUE ALERT  Critical value received:  Potassium 2.6   Date of notification:  04/05/2014   Time of notification:  1021  Critical value read back:: yes   Nurse who received alert:  Gillian Shields RN   MD notified (1st page):  Elmo Putt RN who will let MD be aware who is in Radiation Oncology on 04/05/2014  Dr Sondra Come not in 04/05/2014   Time of first page:  [place] and spoke with Elmo Putt RN at 1132 am   MD notified (2nd page):  Time of second page:  Responding MD:  Elmo Putt RN in Radiation Oncology   Time MD responded:  Spoke with Elmo Putt RN at [phone] am

## 2014-04-05 NOTE — Progress Notes (Signed)
BMP results done 04/05/2014 along with CBC/DIFF and PTT results faxed to Dr Sondra Come. When spoke with Elmo Putt RN made her aware of PTT results along with CBC/DIFF results faxed to Dr Gery Pray.  At the time she was made aware of critical value results of Potassium.  2.6.

## 2014-04-05 NOTE — Patient Instructions (Signed)
[place]  04/05/2014   Your procedure is scheduled on:      Tuesday April 09, 2014   Report to [facility] Main Entrance and follow signs to  [facility] arrive at [place] AM.   Call this number if you have problems the morning of surgery [phone] or Presurgical Testing [phone].   Remember:  Do not eat food or drink liquids :After Midnight.   Take these medicines the morning of surgery with A SIP OF WATER: Hydrocodone-Acetaminophen if needed; Lorazepam if needed;                               You may not have any metal on your body including hair pins and piercings  Do not wear jewelry, make-up, lotions, powders,perfumes or deodorant.  Do not shave body hair  48 hours(2 days) of CHG soap use.                Do not bring valuables to the hospital. [place].  Contacts, dentures or bridgework may not be worn into surgery.    Patients discharged the day of surgery will not be allowed to drive home.  Name and phone number of your driver:Brad Manus Rudd (boyfriend)   ________________________________________________________________________  [facility] - Preparing for Surgery Before surgery, you can play an important role.  Because skin is not sterile, your skin needs to be as free of germs as possible.  You can reduce the number of germs on your skin by washing with CHG (chlorahexidine gluconate) soap before surgery.  CHG is an antiseptic cleaner which kills germs and bonds with the skin to continue killing germs even after washing. Please DO NOT use if you have an allergy to CHG or antibacterial soaps.  If your skin becomes reddened/irritated stop using the CHG and inform your nurse when you arrive at Short Stay. Do not shave (including legs and underarms) for at least 48 hours prior to the first CHG shower.  You may shave your face/neck. Please follow these instructions carefully:  1.  Shower with CHG Soap the night before surgery and the   morning of Surgery.  2.  If you choose to wash your hair, wash your hair first as usual with your  normal  shampoo.  3.  After you shampoo, rinse your hair and body thoroughly to remove the  shampoo.                           4.  Use CHG as you would any other liquid soap.  You can apply chg directly  to the skin and wash                       Gently with a scrungie or clean washcloth.  5.  Apply the CHG Soap to your body ONLY FROM THE NECK DOWN.   Do not use on face/ open                           Wound or open sores. Avoid contact with eyes, ears mouth and genitals (private parts).                       Wash face,  Genitals (private parts) with your normal soap.  6.  Wash thoroughly, paying special attention to the area where your surgery  will be performed.  7.  Thoroughly rinse your body with warm water from the neck down.  8.  DO NOT shower/wash with your normal soap after using and rinsing off  the CHG Soap.                9.  Pat yourself dry with a clean towel.            10.  Wear clean pajamas.            11.  Place clean sheets on your bed the night of your first shower and do not  sleep with pets. Day of Surgery : Do not apply any lotions/deodorants the morning of surgery.  Please wear clean clothes to the hospital/surgery center.  FAILURE TO FOLLOW THESE INSTRUCTIONS MAY RESULT IN THE CANCELLATION OF YOUR SURGERY PATIENT SIGNATURE_________________________________  NURSE SIGNATURE__________________________________  ________________________________________________________________________

## 2014-04-08 NOTE — Progress Notes (Signed)
This nurse received call this am from Santiago Glad with Dr Sondra Come in regards to consent and clarification. This nurse explained that consent will need to be signed day of surgery due to abbreviations noted in consent order. Discussed that no abbreviations are to be used. Santiago Glad verbalized understanding.

## 2014-04-09 ENCOUNTER — Ambulatory Visit
Admission: RE | Admit: 2014-04-09 | Discharge: 2014-04-09 | Disposition: A | Discharge status: Home / Self Care | Payer: Medicaid - Out of State | Source: Ambulatory Visit | Attending: Radiation Oncology | Admitting: Radiation Oncology

## 2014-04-09 ENCOUNTER — Ambulatory Visit (HOSPITAL_COMMUNITY): Payer: Medicaid - Out of State | Admitting: Anesthesiology

## 2014-04-09 ENCOUNTER — Encounter (HOSPITAL_COMMUNITY): Payer: Self-pay | Admitting: *Deleted

## 2014-04-09 ENCOUNTER — Ambulatory Visit (HOSPITAL_COMMUNITY)
Admission: RE | Admit: 2014-04-09 | Discharge: 2014-04-09 | Disposition: A | Discharge status: Home / Self Care | Payer: Medicaid - Out of State | Source: Ambulatory Visit | Attending: Radiation Oncology | Admitting: Radiation Oncology

## 2014-04-09 ENCOUNTER — Encounter (HOSPITAL_COMMUNITY)
Admission: RE | Disposition: A | Discharge status: Home / Self Care | Payer: Self-pay | Source: Ambulatory Visit | Attending: Radiation Oncology

## 2014-04-09 VITALS — BP 109/68 | HR 85 | Temp 98.0°F | Resp 16

## 2014-04-09 DIAGNOSIS — D649 Anemia, unspecified: Secondary | ICD-10-CM | POA: Insufficient documentation

## 2014-04-09 DIAGNOSIS — Z87891 Personal history of nicotine dependence: Secondary | ICD-10-CM | POA: Insufficient documentation

## 2014-04-09 DIAGNOSIS — Z79891 Long term (current) use of opiate analgesic: Secondary | ICD-10-CM | POA: Diagnosis not present

## 2014-04-09 DIAGNOSIS — Z7952 Long term (current) use of systemic steroids: Secondary | ICD-10-CM | POA: Insufficient documentation

## 2014-04-09 DIAGNOSIS — C539 Malignant neoplasm of cervix uteri, unspecified: Secondary | ICD-10-CM

## 2014-04-09 DIAGNOSIS — Z9851 Tubal ligation status: Secondary | ICD-10-CM | POA: Insufficient documentation

## 2014-04-09 DIAGNOSIS — R110 Nausea: Secondary | ICD-10-CM | POA: Insufficient documentation

## 2014-04-09 DIAGNOSIS — Z79899 Other long term (current) drug therapy: Secondary | ICD-10-CM | POA: Diagnosis not present

## 2014-04-09 DIAGNOSIS — Z510 Encounter for antineoplastic radiation therapy: Secondary | ICD-10-CM | POA: Diagnosis not present

## 2014-04-09 HISTORY — DX: Malignant neoplasm of cervix uteri, unspecified: C53.9

## 2014-04-09 HISTORY — PX: TANDEM RING INSERTION: SHX6199

## 2014-04-09 SURGERY — INSERTION, UTERINE TANDEM AND RING OR CYLINDER, FOR BRACHYTHERAPY
Anesthesia: General | Site: Vagina

## 2014-04-09 MED ORDER — HYDROMORPHONE HCL 1 MG/ML IJ SOLN
1.0000 mg | Freq: Once | INTRAMUSCULAR | Status: AC
  Administered 2014-04-09: 1 mg via INTRAVENOUS
  Filled 2014-04-09: qty 1

## 2014-04-09 MED ORDER — FENTANYL CITRATE 0.05 MG/ML IJ SOLN
INTRAMUSCULAR | Status: AC
  Filled 2014-04-09: qty 2

## 2014-04-09 MED ORDER — LORAZEPAM 0.5 MG PO TABS
0.5000 mg | ORAL_TABLET | Freq: Once | ORAL | Status: AC
  Administered 2014-04-09: 0.5 mg via ORAL
  Filled 2014-04-09: qty 1

## 2014-04-09 MED ORDER — MIDAZOLAM HCL 5 MG/5ML IJ SOLN
INTRAMUSCULAR | Status: DC | PRN
  Administered 2014-04-09: 1 mg via INTRAVENOUS

## 2014-04-09 MED ORDER — ONDANSETRON HCL 4 MG/2ML IJ SOLN
INTRAMUSCULAR | Status: DC | PRN
  Administered 2014-04-09: 4 mg via INTRAVENOUS

## 2014-04-09 MED ORDER — HYDROMORPHONE HCL 4 MG/ML IJ SOLN: 1.0000 mg | Freq: Once | INTRAMUSCULAR | Status: DC

## 2014-04-09 MED ORDER — FENTANYL CITRATE 0.05 MG/ML IJ SOLN
INTRAMUSCULAR | Status: AC
  Filled 2014-04-09: qty 5

## 2014-04-09 MED ORDER — LIDOCAINE HCL 1 % IJ SOLN
INTRAMUSCULAR | Status: AC
  Filled 2014-04-09: qty 20

## 2014-04-09 MED ORDER — WATER FOR IRRIGATION, STERILE IR SOLN
Status: DC | PRN
  Administered 2014-04-09: 3000 mL

## 2014-04-09 MED ORDER — PROMETHAZINE HCL 25 MG/ML IJ SOLN: 6.2500 mg | INTRAMUSCULAR | Status: DC | PRN

## 2014-04-09 MED ORDER — FENTANYL CITRATE 0.05 MG/ML IJ SOLN
INTRAMUSCULAR | Status: DC | PRN
  Administered 2014-04-09 (×6): 25 ug via INTRAVENOUS

## 2014-04-09 MED ORDER — LACTATED RINGERS IV SOLN
INTRAVENOUS | Status: DC
  Administered 2014-04-09: 09:00:00 via INTRAVENOUS

## 2014-04-09 MED ORDER — LACTATED RINGERS IV SOLN: INTRAVENOUS | Status: DC

## 2014-04-09 MED ORDER — MIDAZOLAM HCL 2 MG/2ML IJ SOLN
INTRAMUSCULAR | Status: AC
  Filled 2014-04-09: qty 2

## 2014-04-09 MED ORDER — PROPOFOL 10 MG/ML IV BOLUS
INTRAVENOUS | Status: DC | PRN
  Administered 2014-04-09: 40 mg via INTRAVENOUS
  Administered 2014-04-09: 150 mg via INTRAVENOUS

## 2014-04-09 MED ORDER — LACTATED RINGERS IV SOLN
INTRAVENOUS | Status: DC | PRN
  Administered 2014-04-09: 07:00:00 via INTRAVENOUS

## 2014-04-09 MED ORDER — DEXAMETHASONE SODIUM PHOSPHATE 10 MG/ML IJ SOLN
INTRAMUSCULAR | Status: AC
  Filled 2014-04-09: qty 1

## 2014-04-09 MED ORDER — MEPERIDINE HCL 50 MG/ML IJ SOLN: 6.2500 mg | INTRAMUSCULAR | Status: DC | PRN

## 2014-04-09 MED ORDER — DEXAMETHASONE SODIUM PHOSPHATE 10 MG/ML IJ SOLN
INTRAMUSCULAR | Status: DC | PRN
  Administered 2014-04-09: 10 mg via INTRAVENOUS

## 2014-04-09 MED ORDER — FENTANYL CITRATE 0.05 MG/ML IJ SOLN
25.0000 ug | INTRAMUSCULAR | Status: DC | PRN
  Administered 2014-04-09: 25 ug via INTRAVENOUS
  Administered 2014-04-09: 50 ug via INTRAVENOUS
  Administered 2014-04-09: 25 ug via INTRAVENOUS

## 2014-04-09 MED ORDER — ESTRADIOL 0.1 MG/GM VA CREA
TOPICAL_CREAM | VAGINAL | Status: AC
  Filled 2014-04-09: qty 42.5

## 2014-04-09 MED ORDER — PROPOFOL 10 MG/ML IV BOLUS
INTRAVENOUS | Status: AC
  Filled 2014-04-09: qty 20

## 2014-04-09 MED ORDER — ONDANSETRON HCL 4 MG/2ML IJ SOLN
INTRAMUSCULAR | Status: AC
  Filled 2014-04-09: qty 2

## 2014-04-09 SURGICAL SUPPLY — 36 items
BAG URINE DRAINAGE (UROLOGICAL SUPPLIES) ×3 IMPLANT
BNDG CONFORM 2 STRL LF (GAUZE/BANDAGES/DRESSINGS) ×3 IMPLANT
CATH FOLEY 2WAY SLVR  5CC 16FR (CATHETERS) ×2
CATH FOLEY 2WAY SLVR 5CC 16FR (CATHETERS) ×1 IMPLANT
COVER SURGICAL LIGHT HANDLE (MISCELLANEOUS) ×3 IMPLANT
DRAPE SHEET LG 3/4 BI-LAMINATE (DRAPES) ×3 IMPLANT
DRAPE TABLE BACK 44X90 PK DISP (DRAPES) ×3 IMPLANT
DRSG PAD ABDOMINAL 8X10 ST (GAUZE/BANDAGES/DRESSINGS) IMPLANT
GLOVE BIO SURGEON STRL SZ7.5 (GLOVE) ×3 IMPLANT
GLOVE BIOGEL PI IND STRL 6.5 (GLOVE) ×1 IMPLANT
GLOVE BIOGEL PI IND STRL 7.5 (GLOVE) ×1 IMPLANT
GLOVE BIOGEL PI INDICATOR 6.5 (GLOVE) ×2
GLOVE BIOGEL PI INDICATOR 7.5 (GLOVE) ×2
GLOVE SURG SS PI 6.5 STRL IVOR (GLOVE) ×3 IMPLANT
GOWN STRL REUS W/TWL LRG LVL3 (GOWN DISPOSABLE) ×6 IMPLANT
GOWN STRL REUS W/TWL XL LVL3 (GOWN DISPOSABLE) ×3 IMPLANT
HOLDER FOLEY CATH W/STRAP (MISCELLANEOUS) ×3 IMPLANT
KIT BASIN OR (CUSTOM PROCEDURE TRAY) ×3 IMPLANT
LEGGING LITHOTOMY PAIR STRL (DRAPES) ×3 IMPLANT
NEEDLE SPNL 22GX3.5 QUINCKE BK (NEEDLE) IMPLANT
PAD OB MATERNITY 4.3X12.25 (PERSONAL CARE ITEMS) ×3 IMPLANT
PLUG CATH AND CAP STER (CATHETERS) ×3 IMPLANT
SET IRRIG Y TYPE TUR BLADDER L (SET/KITS/TRAYS/PACK) ×3 IMPLANT
SUT PROLENE 0 SH 30 (SUTURE) IMPLANT
SUT SILK 2 0 30  PSL (SUTURE)
SUT SILK 2 0 30 PSL (SUTURE) IMPLANT
SUT VIC AB 0 CT1 27 (SUTURE) ×4
SUT VIC AB 0 CT1 27XBRD ANTBC (SUTURE) ×2 IMPLANT
SYR BULB IRRIGATION 50ML (SYRINGE) IMPLANT
SYR CONTROL 10ML LL (SYRINGE) IMPLANT
SYRINGE 10CC LL (SYRINGE) ×3 IMPLANT
TOWEL OR 17X26 10 PK STRL BLUE (TOWEL DISPOSABLE) ×6 IMPLANT
UNDERPAD 30X30 INCONTINENT (UNDERPADS AND DIAPERS) ×3 IMPLANT
WATER STERILE IRR 3000ML UROMA (IV SOLUTION) ×6 IMPLANT
WATER STERILE IRR 500ML POUR (IV SOLUTION) ×6 IMPLANT
YANKAUER SUCT BULB TIP NO VENT (SUCTIONS) ×3 IMPLANT

## 2014-04-09 NOTE — Progress Notes (Signed)
  Radiation Oncology         [phone]) [phone] ________________________________  Name: Erika [name] MRN: [id number]  Date: 04/09/2014  DOB: 1975-04-17  SIMULATION AND TREATMENT PLANNING NOTE HDR BRACHYTHERAPY  DIAGNOSIS:  Stage II-B  squamous cell cervical cancer  NARRATIVE:  The patient was brought to the [place] suite.  Identity was confirmed.  All relevant records and images related to the planned course of therapy were reviewed.  The patient freely provided informed written consent to proceed with treatment after reviewing the details related to the planned course of therapy. The consent form was witnessed and verified by the simulation staff.  Then, the patient was set-up in a stable reproducible  supine position for radiation therapy. The patient's Foley catheter was accessed and contrast was placed within the bladder. A rectal tube was placed with contrast instilled into the rectal vault. The patient also had fiducial markers placed within the tandem/ring system   CT images were obtained.  Surface markings were placed.  The CT images were loaded into the planning software.  Then the target and avoidance structures were contoured.  Treatment planning then occurred.  The radiation prescription was entered and confirmed.   I have requested : Brachytherapy Isodose Plan and Dosimetry Calculations to plan the radiation distribution.    PLAN:  The patient will receive 6 Gy in 1 fraction.    ________________________________  Blair Promise, PhD, MD

## 2014-04-09 NOTE — Progress Notes (Signed)
Assisted patient as she sat up to the edge of the bed to dress for discharge home. Understand patient expressed slight nausea. Administered Ativan 0.5 mg SL as ordered by Dr. Sondra Come. Will assess patient status in 20 minutes.

## 2014-04-09 NOTE — Progress Notes (Signed)
Erika [name] rates her pain at 0/10.  She is resting comfortably.  She has been given a ham sandwich and chips.  Her fiance is in the room.  Call light in reach.  Will continue to monitor.

## 2014-04-09 NOTE — Progress Notes (Signed)
Erika [name] reports pain at a 5/10.  Patient given 1 mg dilaudid IV per Dr. Sondra Come.  Patient tolerated well.

## 2014-04-09 NOTE — Progress Notes (Signed)
Reviewed AVA and discharge instructions with patient.

## 2014-04-09 NOTE — Progress Notes (Signed)
Changed Erika [name]'s IV tubing.  LR is now running at 50 ml/hr into her left hand IV.

## 2014-04-09 NOTE — Anesthesia Procedure Notes (Signed)
Procedure Name: LMA Insertion Date/Time: 04/09/2014 7:26 AM Performed by: Dione Booze Pre-anesthesia Checklist: Patient identified, Emergency Drugs available, Suction available and Patient being monitored Patient Re-evaluated:Patient Re-evaluated prior to inductionOxygen Delivery Method: Circle system utilized Preoxygenation: Pre-oxygenation with 100% oxygen Intubation Type: IV induction LMA: LMA inserted LMA Size: 3.0 Number of attempts: 1 Tube secured with: Tape Dental Injury: Teeth and Oropharynx as per pre-operative assessment

## 2014-04-09 NOTE — Anesthesia Preprocedure Evaluation (Addendum)
Anesthesia Evaluation  Patient identified by MRN, date of birth, ID band Patient awake    Reviewed: Allergy & Precautions, NPO status , Patient's Chart, lab work & pertinent test results  Airway Mallampati: II  TM Distance: >3 FB Neck ROM: Full    Dental no notable dental hx. (+) Chipped   Pulmonary neg pulmonary ROS, former smoker,  breath sounds clear to auscultation  Pulmonary exam normal       Cardiovascular negative cardio ROS  Rhythm:Regular Rate:Normal     Neuro/Psych negative neurological ROS  negative psych ROS   GI/Hepatic negative GI ROS, Neg liver ROS,   Endo/Other  negative endocrine ROS  Renal/GU negative Renal ROS  negative genitourinary   Musculoskeletal negative musculoskeletal ROS (+)   Abdominal   Peds negative pediatric ROS (+)  Hematology  (+) anemia ,   Anesthesia Other Findings   Reproductive/Obstetrics negative OB ROS                            Anesthesia Physical Anesthesia Plan  ASA: II  Anesthesia Plan: General   Post-op Pain Management:    Induction: Intravenous  Airway Management Planned: LMA  Additional Equipment:   Intra-op Plan:   Post-operative Plan: Extubation in OR  Informed Consent: I have reviewed the patients History and Physical, chart, labs and discussed the procedure including the risks, benefits and alternatives for the proposed anesthesia with the patient or authorized representative who has indicated his/her understanding and acceptance.   Dental advisory given  Plan Discussed with: CRNA  Anesthesia Plan Comments:         Anesthesia Quick Evaluation

## 2014-04-09 NOTE — Brief Op Note (Signed)
04/09/2014  8:54 AM  PATIENT:  Erika [name]  38 y.o. female  PRE-OPERATIVE DIAGNOSIS:  cancer cervix  POST-OPERATIVE DIAGNOSIS:  cancer cervix  PROCEDURE:  Procedure(s): TANDEM RING PLACEMENT FOR HIGH DOSE RATE RADIATION THERAPY EXAM UNDER ANETHESIA PLACEMENT OF CERVICAL SLEEVE (N/A)  SURGEON:  Surgeon(s) and Role:    * Gery Pray, MD - Primary    * Everitt Amber, MD - Assisting  PHYSICIAN ASSISTANT:   ASSISTANTS:  ANESTHESIA:   general LMA  EBL:  Total I/O In: 800 [I.V.:800] Out: 50 [Blood:50]  BLOOD ADMINISTERED:none  DRAINS: Urinary Catheter (Foley)   LOCAL MEDICATIONS USED:  NONE  SPECIMEN:  No Specimen  DISPOSITION OF SPECIMEN:  N/A  COUNTS:  YES  TOURNIQUET:  * No tourniquets in log *  DICTATION: Prior to anesthesia timeout was performed. The patient was prepped and draped in the usual sterile fashion and placed in the dorsal lithotomy position. A Foley catheter was inserted without difficulty. On examination the patient's cervical mass had decreased in size and ~ 4 x 4 centimeters in size. The patient proceeded to undergo sounding and dilation of the cervix by Dr. Denman George. The uterus sounded to 8 cm. The patient then had a 6 cm  cervical sleeve placed within the cervical canal and uterus. The cervical sleeve was sewn to the cervix by Dr. Denman George without difficulty. Minimal blood loss was noted. The patient then had approximately 200 mL of sterile water placed in the bladder for ultrasound imaging purposes. A good view of the cervical sleeve was noted and was in good position within the uterine cavity by ultrasound. On ultrasound the cervical mass measured approximately 4.5 x 4.5 x 3.5 cm .Patient then had placement of a 45 60 mm tandem within the uterine cavity and inside the cervical sleeve. The 45  ring was placed along the cervix and affixed to the tandem. A small shielding cap was placed along the ring.  Patient then had a rectal paddle placed and affixed to the tandem  ring apparatus. There was no room for additional packing along the rectal paddle and rectal area. Palpation along the paddle and ring system showed no rectum extending into the high-dose treatment area. The patient tolerated the procedure well. She was transported to the recovery room in stable condition.  PLAN OF CARE: Transferred to radiation oncology for planning and treatment  PATIENT DISPOSITION:  PACU - hemodynamically stable.   Delay start of Pharmacological VTE agent (>24hrs) due to surgical blood loss or risk of bleeding: not applicable

## 2014-04-09 NOTE — Progress Notes (Signed)
IMMEDIATELY FOLLOWING SURGERY: Do not drive or operate machinery for the first twenty four hours after surgery. Do not make any important decisions for twenty four hours after surgery or while taking narcotic pain medications or sedatives. If you develop intractable nausea and vomiting or a severe headache please notify your doctor immediately.   FOLLOW-UP: You do not need to follow up with anesthesia unless specifically instructed to do so.   WOUND CARE INSTRUCTIONS (if applicable): Expect some mild vaginal bleeding, but if large amount of bleeding occurs please contact Dr. Sondra Come at [phone] or the Radiation On-Call physician. Call for any fever greater than 101.0 degrees or increasing vaginal//abdominal pain or trouble urinating.   QUESTIONS?: Please feel free to call your physician or the hospital operator if you have any questions, and they will be happy to assist you. Resume all medications: as listed on your after visit summary.

## 2014-04-09 NOTE — Progress Notes (Signed)
Erika [name] has completed her radiation treatment.  Left hand IV removed intact. Pressure and band aid applied.  500 cc of clear yellow urine obtained from foley catheter.  Removed foley catheter intact. Pressure and band aid applied.  Assisted Dr. Sondra Come with removal of tandem equipment. Patient tolerated well.

## 2014-04-09 NOTE — Anesthesia Postprocedure Evaluation (Signed)
  Anesthesia Post-op Note  Patient: Erika [name]  Procedure(s) Performed: Procedure(s) (LRB): TANDEM RING PLACEMENT FOR HIGH DOSE RATE RADIATION THERAPY EXAM UNDER ANETHESIA PLACEMENT OF CERVICAL SLEEVE (N/A)  Patient Location: PACU  Anesthesia Type: General  Level of Consciousness: awake and alert   Airway and Oxygen Therapy: Patient Spontanous Breathing  Post-op Pain: mild  Post-op Assessment: Post-op Vital signs reviewed, Patient's Cardiovascular Status Stable, Respiratory Function Stable, Patent Airway and No signs of Nausea or vomiting  Last Vitals:  Filed Vitals:   04/09/14 0900  BP: 129/78  Pulse: 86  Temp:   Resp: 17    Post-op Vital Signs: stable   Complications: No apparent anesthesia complications

## 2014-04-09 NOTE — Progress Notes (Addendum)
Received report from [place] in PACU.  Halia has LR running at [facility] into her left hand IV.  She has a foley catheter that is draining clear, yellow urine.  She reports soreness in her vaginal area.  She was transported on a stretcher along with Thayer Headings, RN to CT SIM.

## 2014-04-09 NOTE — Transfer of Care (Signed)
Immediate Anesthesia Transfer of Care Note  Patient: Erika [name]  Procedure(s) Performed: Procedure(s): TANDEM RING PLACEMENT FOR HIGH DOSE RATE RADIATION THERAPY EXAM UNDER ANETHESIA PLACEMENT OF CERVICAL SLEEVE (N/A)  Patient Location: PACU  Anesthesia Type:General  Level of Consciousness: awake, oriented and patient cooperative  Airway & Oxygen Therapy: Patient Spontanous Breathing and Patient connected to face mask oxygen  Post-op Assessment: Report given to RN and Post -op Vital signs reviewed and stable  Post vital signs: Reviewed and stable  Last Vitals:  Filed Vitals:   04/09/14 0502  BP: 116/61  Pulse: 90  Temp: 36.6 C  Resp: 18    Complications: No apparent anesthesia complications

## 2014-04-09 NOTE — Progress Notes (Signed)
  Radiation Oncology         [phone]) [phone] ________________________________  Name: Erika [name] MRN: [id number]  Date: 04/09/2014  DOB: Aug 24, 1975   HDR BRACHYTHERAPY  DIAGNOSIS: Stage II-B Squamous Cell carcinoma of the Cervix  NARRATIVE: After planning was complete the patient was transferred to the high-dose-rate suite. A fiducial marker was placed within the tandem and ring system.   Verification simulation note  An AP and lateral film was obtained. This showed a slight variation in the tandem and ring position compared to planning films earlier in the day. The patient then proceeded to undergo a cone beam CT scan on the high-dose-rate treatment table. This verified accurate position of the tandem and ring system as planned earlier in the day.  Simple treatment device note  While in the  Operating room the patient had construction of her custom ring/ tandem system. The patient will be treated with a 45 orientation, 60 mm tandem. A 45 orientation ring will also be used for treatment. The small shielding cap was placed over the ring system.  The patient's setup also included a rectal paddle to push the rectum out of the high-dose radiation field  High-dose-rate brachytherapy treatment  The remote afterloading device was affixed to the tandem/ring system by catheter.  The patient then proceeded to undergo her first high-dose-rate treatment directed at the cervical area. The patient was prescribed a dose of 6 Gy to be delivered to the high risk CTV. This was achieved with a total treatment time of 663.7 seconds.  2 channels were used to deliver the patient's treatment within the ring (channel 2) there were 13 dwell positions. Within the tandem (channel 1 there were 11 dwell positions. The patient tolerated treatment well.  after completion of her therapy a radiation survey was performed documenting return of the iridium source into the gamma med safe. Patient was then transferred to the nursing  suite. Her Foley catheter was removed. The patient's rectal paddle ring and tandem system was removed without difficulty. Minimal bleeding was noted. Patient was observed for a period of time and then discharged home. She will continue her external beam treatments at the [facility] tomorrow.   ________________________________  Blair Promise, PhD, MD

## 2014-04-09 NOTE — Progress Notes (Signed)
Parlee is reporting pain at a 7/10 in her vaginal area.  She was given 1 mg of dilaudid IV per Dr. Sondra Come.  Call light in reach.  Will continue to monitor.

## 2014-04-10 ENCOUNTER — Encounter (HOSPITAL_COMMUNITY): Payer: Self-pay | Admitting: Radiation Oncology

## 2014-04-10 ENCOUNTER — Telehealth: Payer: Self-pay | Admitting: Oncology

## 2014-04-10 NOTE — Telephone Encounter (Signed)
Erika [name] left a message requesting pain medication.  Called her back and she said she canceled her radiation treatment in [place] today because she is sore from the procedure yesterday.  She would like to pick up the prescription from [facility] if possible.

## 2014-04-10 NOTE — Addendum Note (Signed)
Encounter addended by: Heywood Footman, RN on: 04/10/2014 10:32 AM<BR>     Documentation filed: Notes Section

## 2014-04-10 NOTE — Telephone Encounter (Signed)
Called Erika [name] and asked if she would be like to pick up the prescription for hydrocodone/acetaminophen tomorrow.  [name] said that would be fine.  [name], RN in [place] and she said she will talk to Dr. Lisbeth Renshaw about filling it tomorrow morning.

## 2014-04-10 NOTE — Progress Notes (Signed)
Late entry from 04/09/2014 at 1740. Patient anxious to go home after a long day at [facility]. Treatment complete. Patient reports nausea resolved with Ativan. Assisted patient as she dawn street clothes. Wheeled patient to lobby and discharged her home with her significant other. Patient understands to contact staff with future needs.

## 2014-04-12 NOTE — Progress Notes (Signed)
Please put orders in Epic surgery 04-23-14 pre op 04-19-14 Thanks

## 2014-04-16 ENCOUNTER — Encounter: Payer: Self-pay | Admitting: Radiation Oncology

## 2014-04-16 NOTE — Progress Notes (Signed)
Auth# [id number] for HDR 04/09/14-04/09/15/spoke to April and Senita at [facility] out of state/lw

## 2014-04-17 NOTE — Patient Instructions (Addendum)
Erika [name]  04/17/2014   Your procedure is scheduled on:  04/23/2014    Report to [facility] Main  Entrance and follow signs to               [place] at     [employer] AM.  Call this number if you have problems the morning of surgery [phone]   Remember:  Do not eat food or drink liquids :After Midnight.     Take these medicines the morning of surgery with A SIP OF WATER:  Ativan or Hydrocodone if needed                                You may not have any metal on your body including hair pins and              piercings  Do not wear jewelry, make-up, lotions, powders or perfumes., deodorant.               Do not wear nail polish.  Do not shave  48 hours prior to surgery.              Do not bring valuables to the hospital. [place].  Contacts, dentures or bridgework may not be worn into surgery.      Patients discharged the day of surgery will not be allowed to drive home.  Name and phone number of your driver:  Special Instructions: coughing and deep breathing exercises, leg exercises              Please read over the following fact sheets you were given: _____________________________________________________________________             [facility] - Preparing for Surgery Before surgery, you can play an important role.  Because skin is not sterile, your skin needs to be as free of germs as possible.  You can reduce the number of germs on your skin by washing with CHG (chlorahexidine gluconate) soap before surgery.  CHG is an antiseptic cleaner which kills germs and bonds with the skin to continue killing germs even after washing. Please DO NOT use if you have an allergy to CHG or antibacterial soaps.  If your skin becomes reddened/irritated stop using the CHG and inform your nurse when you arrive at Short Stay. Do not shave (including legs and underarms) for at least 48 hours prior to the first CHG  shower.  You may shave your face/neck. Please follow these instructions carefully:  1.  Shower with CHG Soap the night before surgery and the  morning of Surgery.  2.  If you choose to wash your hair, wash your hair first as usual with your  normal  shampoo.  3.  After you shampoo, rinse your hair and body thoroughly to remove the  shampoo.                           4.  Use CHG as you would any other liquid soap.  You can apply chg directly  to the skin and wash                       Gently with a  scrungie or clean washcloth.  5.  Apply the CHG Soap to your body ONLY FROM THE NECK DOWN.   Do not use on face/ open                           Wound or open sores. Avoid contact with eyes, ears mouth and genitals (private parts).                       Wash face,  Genitals (private parts) with your normal soap.             6.  Wash thoroughly, paying special attention to the area where your surgery  will be performed.  7.  Thoroughly rinse your body with warm water from the neck down.  8.  DO NOT shower/wash with your normal soap after using and rinsing off  the CHG Soap.                9.  Pat yourself dry with a clean towel.            10.  Wear clean pajamas.            11.  Place clean sheets on your bed the night of your first shower and do not  sleep with pets. Day of Surgery : Do not apply any lotions/deodorants the morning of surgery.  Please wear clean clothes to the hospital/surgery center.  FAILURE TO FOLLOW THESE INSTRUCTIONS MAY RESULT IN THE CANCELLATION OF YOUR SURGERY PATIENT SIGNATURE_________________________________  NURSE SIGNATURE__________________________________  ________________________________________________________________________ [facility] - Preparing for Surgery Before surgery, you can play an important role.  Because skin is not sterile, your skin needs to be as free of germs as possible.  You can reduce the number of germs on your skin by washing with CHG  (chlorahexidine gluconate) soap before surgery.  CHG is an antiseptic cleaner which kills germs and bonds with the skin to continue killing germs even after washing. Please DO NOT use if you have an allergy to CHG or antibacterial soaps.  If your skin becomes reddened/irritated stop using the CHG and inform your nurse when you arrive at Short Stay. Do not shave (including legs and underarms) for at least 48 hours prior to the first CHG shower.  You may shave your face/neck. Please follow these instructions carefully:  1.  Shower with CHG Soap the night before surgery and the  morning of Surgery.  2.  If you choose to wash your hair, wash your hair first as usual with your  normal  shampoo.  3.  After you shampoo, rinse your hair and body thoroughly to remove the  shampoo.                           4.  Use CHG as you would any other liquid soap.  You can apply chg directly  to the skin and wash                       Gently with a scrungie or clean washcloth.  5.  Apply the CHG Soap to your body ONLY FROM THE NECK DOWN.   Do not use on face/ open                           Wound or open sores. Avoid  contact with eyes, ears mouth and genitals (private parts).                       Wash face,  Genitals (private parts) with your normal soap.             6.  Wash thoroughly, paying special attention to the area where your surgery  will be performed.  7.  Thoroughly rinse your body with warm water from the neck down.  8.  DO NOT shower/wash with your normal soap after using and rinsing off  the CHG Soap.                9.  Pat yourself dry with a clean towel.            10.  Wear clean pajamas.            11.  Place clean sheets on your bed the night of your first shower and do not  sleep with pets. Day of Surgery : Do not apply any lotions/deodorants the morning of surgery.  Please wear clean clothes to the hospital/surgery center.  FAILURE TO FOLLOW THESE INSTRUCTIONS MAY RESULT IN THE CANCELLATION OF  YOUR SURGERY PATIENT SIGNATURE_________________________________  NURSE SIGNATURE__________________________________  ________________________________________________________________________

## 2014-04-19 ENCOUNTER — Encounter (HOSPITAL_COMMUNITY): Payer: Self-pay

## 2014-04-19 ENCOUNTER — Encounter (HOSPITAL_COMMUNITY)
Admission: RE | Admit: 2014-04-19 | Discharge: 2014-04-19 | Disposition: A | Discharge status: Home / Self Care | Payer: Medicaid - Out of State | Source: Ambulatory Visit | Attending: Radiation Oncology | Admitting: Radiation Oncology

## 2014-04-19 DIAGNOSIS — C539 Malignant neoplasm of cervix uteri, unspecified: Secondary | ICD-10-CM | POA: Diagnosis not present

## 2014-04-19 DIAGNOSIS — Z01812 Encounter for preprocedural laboratory examination: Secondary | ICD-10-CM | POA: Insufficient documentation

## 2014-04-19 LAB — CBC WITH DIFFERENTIAL/PLATELET
BASOS ABS: 0 10*3/uL (ref 0.0–0.1)
BASOS PCT: 0 % (ref 0–1)
Eosinophils Absolute: 0 10*3/uL (ref 0.0–0.7)
Eosinophils Relative: 1 % (ref 0–5)
HCT: 34.5 % — ABNORMAL LOW (ref 36.0–46.0)
HEMOGLOBIN: 10.4 g/dL — AB (ref 12.0–15.0)
LYMPHS PCT: 15 % (ref 12–46)
Lymphs Abs: 0.2 10*3/uL — ABNORMAL LOW (ref 0.7–4.0)
MCH: 28.1 pg (ref 26.0–34.0)
MCHC: 30.1 g/dL (ref 30.0–36.0)
MCV: 93.2 fL (ref 78.0–100.0)
Monocytes Absolute: 0.2 10*3/uL (ref 0.1–1.0)
Monocytes Relative: 11 % (ref 3–12)
NEUTROS PCT: 73 % (ref 43–77)
Neutro Abs: 1.1 10*3/uL — ABNORMAL LOW (ref 1.7–7.7)
Platelets: 129 10*3/uL — ABNORMAL LOW (ref 150–400)
RBC: 3.7 MIL/uL — ABNORMAL LOW (ref 3.87–5.11)
RDW: 21.6 % — ABNORMAL HIGH (ref 11.5–15.5)
WBC: 1.5 10*3/uL — ABNORMAL LOW (ref 4.0–10.5)

## 2014-04-19 LAB — BASIC METABOLIC PANEL
Anion gap: 9 (ref 5–15)
BUN: 12 mg/dL (ref 6–23)
CHLORIDE: 104 mmol/L (ref 96–112)
CO2: 25 mmol/L (ref 19–32)
Calcium: 9.5 mg/dL (ref 8.4–10.5)
Creatinine, Ser: 0.62 mg/dL (ref 0.50–1.10)
Glucose, Bld: 73 mg/dL (ref 70–99)
POTASSIUM: 5.2 mmol/L — AB (ref 3.5–5.1)
Sodium: 138 mmol/L (ref 135–145)

## 2014-04-19 LAB — HCG, SERUM, QUALITATIVE: Preg, Serum: NEGATIVE

## 2014-04-19 NOTE — Progress Notes (Signed)
CBC/DIFF and BMp results done 04/19/2014 faxed via EPIC to Dr Sondra Come.

## 2014-04-19 NOTE — Progress Notes (Signed)
Patient stated at time of preop appointment today that she would have a total of 5 surgeries.  The one on 04/23/2014 being the second one.  I know that she receives her cancer treatment in [place] or [place] and I was wondering instead of the patient coming to [facility] so often for a preop appointment related to her surgeries if there would be any way she could have preop labs drawn at the [facility] where she receives treatment and then we could just call her over the phone and give her preop instructions.  Just give me a call at [phone] in PreSurgical Testing regarding this.

## 2014-04-19 NOTE — Progress Notes (Signed)
Left voice mail message for Elmo Putt RN returning her call from earlier today.  Also spoke with Enid Derry, nurse in Radiation Oncology regarding the fact that labs done 04/19/2014 faxed to Dr Sondra Come via [facility].

## 2014-04-19 NOTE — Progress Notes (Signed)
Spoke with Elmo Putt, RN with Radiation Oncology .  Elmo Putt aware of abnormal labs done 04/19/2014.  Elmo Putt RN stated Dr Sondra Come will return on 04/22/14.  Elmo Putt aware of earlier note I sent to her regarding having patient  Go to [place] to have labs done prior to surgery to save her a trip to [place] PST and that we could call her with preop instructions and labs could be faxed to [place] to be placed on chart and also sent to Dr Sondra Come.  Elmo Putt stated she agreed with this and that she would talk with Dr Sondra Come regarding this issue but thought it was a very good idea for patient.

## 2014-04-19 NOTE — Progress Notes (Signed)
Patient completed chemotherapy on approximately 04/04/2014 per patient.  Patient is ongoing with radiation.

## 2014-04-23 ENCOUNTER — Encounter: Payer: Self-pay | Admitting: Radiation Oncology

## 2014-04-23 ENCOUNTER — Ambulatory Visit
Admission: RE | Admit: 2014-04-23 | Discharge: 2014-04-23 | Disposition: A | Discharge status: Home / Self Care | Payer: Medicaid - Out of State | Source: Ambulatory Visit | Attending: Radiation Oncology | Admitting: Radiation Oncology

## 2014-04-23 ENCOUNTER — Ambulatory Visit (HOSPITAL_COMMUNITY): Payer: Medicaid - Out of State | Admitting: Anesthesiology

## 2014-04-23 ENCOUNTER — Encounter (HOSPITAL_COMMUNITY)
Admission: RE | Disposition: A | Discharge status: Home / Self Care | Payer: Self-pay | Source: Ambulatory Visit | Attending: Radiation Oncology

## 2014-04-23 ENCOUNTER — Ambulatory Visit (HOSPITAL_COMMUNITY)
Admission: RE | Admit: 2014-04-23 | Discharge: 2014-04-23 | Disposition: A | Discharge status: Home / Self Care | Payer: Medicaid - Out of State | Source: Ambulatory Visit | Attending: Radiation Oncology | Admitting: Radiation Oncology

## 2014-04-23 ENCOUNTER — Encounter (HOSPITAL_COMMUNITY): Payer: Self-pay

## 2014-04-23 VITALS — BP 129/76 | HR 108 | Temp 98.1°F

## 2014-04-23 DIAGNOSIS — Z79891 Long term (current) use of opiate analgesic: Secondary | ICD-10-CM | POA: Insufficient documentation

## 2014-04-23 DIAGNOSIS — C539 Malignant neoplasm of cervix uteri, unspecified: Secondary | ICD-10-CM

## 2014-04-23 DIAGNOSIS — Z7952 Long term (current) use of systemic steroids: Secondary | ICD-10-CM | POA: Diagnosis not present

## 2014-04-23 DIAGNOSIS — Z9851 Tubal ligation status: Secondary | ICD-10-CM | POA: Insufficient documentation

## 2014-04-23 DIAGNOSIS — D649 Anemia, unspecified: Secondary | ICD-10-CM | POA: Insufficient documentation

## 2014-04-23 DIAGNOSIS — Z87891 Personal history of nicotine dependence: Secondary | ICD-10-CM | POA: Insufficient documentation

## 2014-04-23 DIAGNOSIS — Z510 Encounter for antineoplastic radiation therapy: Secondary | ICD-10-CM | POA: Diagnosis not present

## 2014-04-23 HISTORY — PX: TANDEM RING INSERTION: SHX6199

## 2014-04-23 SURGERY — INSERTION, UTERINE TANDEM AND RING OR CYLINDER, FOR BRACHYTHERAPY
Anesthesia: General

## 2014-04-23 MED ORDER — LACTATED RINGERS IV SOLN
INTRAVENOUS | Status: DC
  Administered 2014-04-23: 12:00:00 via INTRAVENOUS
  Filled 2014-04-23: qty 250

## 2014-04-23 MED ORDER — MIDAZOLAM HCL 5 MG/5ML IJ SOLN
INTRAMUSCULAR | Status: DC | PRN
  Administered 2014-04-23: 1 mg via INTRAVENOUS

## 2014-04-23 MED ORDER — PROPOFOL 10 MG/ML IV BOLUS
INTRAVENOUS | Status: DC | PRN
  Administered 2014-04-23: 100 mg via INTRAVENOUS

## 2014-04-23 MED ORDER — ONDANSETRON HCL 4 MG/2ML IJ SOLN
INTRAMUSCULAR | Status: DC | PRN
  Administered 2014-04-23: 4 mg via INTRAVENOUS

## 2014-04-23 MED ORDER — MEPERIDINE HCL 50 MG/ML IJ SOLN: 6.2500 mg | INTRAMUSCULAR | Status: DC | PRN

## 2014-04-23 MED ORDER — FENTANYL CITRATE 0.05 MG/ML IJ SOLN
INTRAMUSCULAR | Status: AC
  Filled 2014-04-23: qty 2

## 2014-04-23 MED ORDER — LIDOCAINE HCL (CARDIAC) 20 MG/ML IV SOLN
INTRAVENOUS | Status: DC | PRN
  Administered 2014-04-23: 100 mg via INTRAVENOUS

## 2014-04-23 MED ORDER — LACTATED RINGERS IV SOLN
INTRAVENOUS | Status: DC | PRN
  Administered 2014-04-23: 07:00:00 via INTRAVENOUS

## 2014-04-23 MED ORDER — PROPOFOL 10 MG/ML IV BOLUS
INTRAVENOUS | Status: AC
  Filled 2014-04-23: qty 20

## 2014-04-23 MED ORDER — HYDROMORPHONE HCL 1 MG/ML IJ SOLN
0.5000 mg | Freq: Once | INTRAMUSCULAR | Status: AC
  Administered 2014-04-23: 0.5 mg via INTRAVENOUS
  Filled 2014-04-23: qty 1

## 2014-04-23 MED ORDER — MIDAZOLAM HCL 2 MG/2ML IJ SOLN
INTRAMUSCULAR | Status: AC
  Filled 2014-04-23: qty 2

## 2014-04-23 MED ORDER — FENTANYL CITRATE 0.05 MG/ML IJ SOLN
INTRAMUSCULAR | Status: DC | PRN
  Administered 2014-04-23: 25 ug via INTRAVENOUS
  Administered 2014-04-23: 50 ug via INTRAVENOUS
  Administered 2014-04-23: 25 ug via INTRAVENOUS

## 2014-04-23 MED ORDER — DEXAMETHASONE SODIUM PHOSPHATE 10 MG/ML IJ SOLN
INTRAMUSCULAR | Status: DC | PRN
  Administered 2014-04-23: 5 mg via INTRAVENOUS

## 2014-04-23 MED ORDER — FENTANYL CITRATE 0.05 MG/ML IJ SOLN
25.0000 ug | INTRAMUSCULAR | Status: DC | PRN
  Administered 2014-04-23 (×2): 50 ug via INTRAVENOUS

## 2014-04-23 MED ORDER — PROMETHAZINE HCL 25 MG/ML IJ SOLN: 6.2500 mg | INTRAMUSCULAR | Status: DC | PRN

## 2014-04-23 MED ORDER — LIDOCAINE HCL (CARDIAC) 20 MG/ML IV SOLN
INTRAVENOUS | Status: AC
  Filled 2014-04-23: qty 5

## 2014-04-23 MED ORDER — DEXAMETHASONE SODIUM PHOSPHATE 10 MG/ML IJ SOLN
INTRAMUSCULAR | Status: AC
  Filled 2014-04-23: qty 1

## 2014-04-23 MED ORDER — LIDOCAINE HCL 1 % IJ SOLN
INTRAMUSCULAR | Status: AC
  Filled 2014-04-23: qty 20

## 2014-04-23 MED ORDER — ESTRADIOL 0.1 MG/GM VA CREA
TOPICAL_CREAM | VAGINAL | Status: AC
  Filled 2014-04-23: qty 42.5

## 2014-04-23 MED ORDER — ONDANSETRON HCL 4 MG/2ML IJ SOLN
INTRAMUSCULAR | Status: AC
  Filled 2014-04-23: qty 2

## 2014-04-23 MED ORDER — LACTATED RINGERS IV SOLN: INTRAVENOUS | Status: DC

## 2014-04-23 SURGICAL SUPPLY — 27 items
BAG URINE DRAINAGE (UROLOGICAL SUPPLIES) IMPLANT
BNDG CONFORM 2 STRL LF (GAUZE/BANDAGES/DRESSINGS) IMPLANT
CATH FOLEY 2WAY SLVR  5CC 16FR (CATHETERS)
CATH FOLEY 2WAY SLVR 5CC 16FR (CATHETERS) IMPLANT
DRAPE SHEET LG 3/4 BI-LAMINATE (DRAPES) ×3 IMPLANT
DRAPE TABLE BACK 44X90 PK DISP (DRAPES) ×3 IMPLANT
DRSG PAD ABDOMINAL 8X10 ST (GAUZE/BANDAGES/DRESSINGS) ×3 IMPLANT
GLOVE BIO SURGEON STRL SZ7.5 (GLOVE) ×3 IMPLANT
GOWN STRL REUS W/TWL LRG LVL3 (GOWN DISPOSABLE) ×6 IMPLANT
HOLDER FOLEY CATH W/STRAP (MISCELLANEOUS) ×3 IMPLANT
KIT BASIN OR (CUSTOM PROCEDURE TRAY) ×3 IMPLANT
LEGGING LITHOTOMY PAIR STRL (DRAPES) ×3 IMPLANT
NEEDLE SPNL 22GX3.5 QUINCKE BK (NEEDLE) IMPLANT
PAD ABD 8X10 STRL (GAUZE/BANDAGES/DRESSINGS) ×3 IMPLANT
PAD OB MATERNITY 4.3X12.25 (PERSONAL CARE ITEMS) ×3 IMPLANT
PLUG CATH AND CAP STER (CATHETERS) ×3 IMPLANT
SUT PROLENE 0 SH 30 (SUTURE) IMPLANT
SUT SILK 2 0 30  PSL (SUTURE)
SUT SILK 2 0 30 PSL (SUTURE) IMPLANT
SYR BULB IRRIGATION 50ML (SYRINGE) IMPLANT
SYR CONTROL 10ML LL (SYRINGE) IMPLANT
SYRINGE 10CC LL (SYRINGE) ×3 IMPLANT
TOWEL OR 17X26 10 PK STRL BLUE (TOWEL DISPOSABLE) ×6 IMPLANT
TRAY FOLEY CATH 16FR SILVER (SET/KITS/TRAYS/PACK) ×3 IMPLANT
UNDERPAD 30X30 INCONTINENT (UNDERPADS AND DIAPERS) ×3 IMPLANT
WATER STERILE IRR 3000ML UROMA (IV SOLUTION) ×3 IMPLANT
WATER STERILE IRR 500ML POUR (IV SOLUTION) ×3 IMPLANT

## 2014-04-23 NOTE — Progress Notes (Signed)
Patient reports pain at an 8/10 in her vaginal area.  0.5 mg dilaudid given IV per Dr. Sondra Come.

## 2014-04-23 NOTE — Anesthesia Preprocedure Evaluation (Signed)
Anesthesia Evaluation  Patient identified by MRN, date of birth, ID band Patient awake    Reviewed: Allergy & Precautions, NPO status , Patient's Chart, lab work & pertinent test results  Airway Mallampati: II  TM Distance: >3 FB Neck ROM: Full    Dental no notable dental hx. (+) Chipped   Pulmonary neg pulmonary ROS, former smoker,  breath sounds clear to auscultation  Pulmonary exam normal       Cardiovascular negative cardio ROS  Rhythm:Regular Rate:Normal     Neuro/Psych negative neurological ROS  negative psych ROS   GI/Hepatic negative GI ROS, Neg liver ROS,   Endo/Other  negative endocrine ROS  Renal/GU negative Renal ROS  negative genitourinary   Musculoskeletal negative musculoskeletal ROS (+)   Abdominal   Peds negative pediatric ROS (+)  Hematology  (+) anemia ,   Anesthesia Other Findings   Reproductive/Obstetrics negative OB ROS                             Anesthesia Physical  Anesthesia Plan  ASA: II  Anesthesia Plan: General   Post-op Pain Management:    Induction: Intravenous  Airway Management Planned: LMA  Additional Equipment:   Intra-op Plan:   Post-operative Plan: Extubation in OR  Informed Consent: I have reviewed the patients History and Physical, chart, labs and discussed the procedure including the risks, benefits and alternatives for the proposed anesthesia with the patient or authorized representative who has indicated his/her understanding and acceptance.   Dental advisory given  Plan Discussed with: CRNA  Anesthesia Plan Comments:         Anesthesia Quick Evaluation

## 2014-04-23 NOTE — Progress Notes (Signed)
275 ml in foley drainage bag of yellow urine.  Removed foley catheter intact.  Assisted Dr. Sondra Come with tandem and ring removal.  Patient tolerated well.  Removed left hand IV intact.  Pressure and band aid applied.  Patient given discharge instructions and AVS.

## 2014-04-23 NOTE — Progress Notes (Signed)
Obtained reports from [place], [place] in PACU.  Erika [name] has LR running at gravity to her left hand IV.  She has a foley catheter draining yellow urine.  She denies pain but reports she is sore.  Transported patient to room 1 with [facility] Headings, Therapist, sports.    Put IV fluids on pump at 50 ml/hr.  Call light in reach.  Patient's fiance present in the room.

## 2014-04-23 NOTE — Progress Notes (Signed)
  Radiation Oncology         [phone]) [phone] ________________________________  Name: Cassandria Drew MRN: [id number]  Date: 04/23/2014  DOB: 09/23/1975  SIMULATION AND TREATMENT PLANNING NOTE HDR BRACHYTHERAPY  DIAGNOSIS:  Stage II-B squamous cell cervical cancer  NARRATIVE:  The patient was brought to the [place] suite.  Identity was confirmed.  All relevant records and images related to the planned course of therapy were reviewed.  The patient freely provided informed written consent to proceed with treatment after reviewing the details related to the planned course of therapy. The consent form was witnessed and verified by the simulation staff.  Then, the patient was set-up in a stable reproducible  supine position for radiation therapy.  The patient's Foley catheter was accessed and contrast was placed within the bladder. A rectal tube was placed with contrast instilled into the rectal vault. The patient also had fiducial markers placed within the tandem/ring system CT images were obtained.  Surface markings were placed.  The CT images were loaded into the planning software.  Then the target and avoidance structures were contoured.  Treatment planning then occurred.  The radiation prescription was entered and confirmed.   I have requested : Brachytherapy Isodose Plan and Dosimetry Calculations to plan the radiation distribution.    PLAN:  The patient will receive 6 Gy in 1 fraction.    ________________________________  Blair Promise, PhD, MD

## 2014-04-23 NOTE — Progress Notes (Signed)
Patient reports pain at a 6/10 in her vaginal area.  0.5 mg dilaudid IV per Dr. Sondra Come given.  Will continue to monitor.

## 2014-04-23 NOTE — Progress Notes (Signed)
  Name: Erika [name]: [id number] Date: 3/22/2016DOB: 1975-02-24   HDR BRACHYTHERAPY  DIAGNOSIS: Stage II-B Squamous Cell carcinoma of the Cervix  NARRATIVE: After planning was complete the patient was transferred to the high-dose-rate suite. A fiducial marker was placed within the tandem and ring system.   Verification simulation note  An AP and lateral film was obtained. . This verified accurate position of the tandem and ring system as planned earlier in the day.  Simple treatment device note  While in the Operating room the patient had construction of her custom ring/ tandem system. The patient will be treated with a 45 orientation, 60 mm tandem. A 45 orientation ring will also be used for treatment. The small shielding cap was placed over the ring system. The patient's setup also included a rectal paddle to push the rectum out of the high-dose radiation field  High-dose-rate brachytherapy treatment  The remote afterloading device was affixed to the tandem/ring system by catheter. The patient then proceeded to undergo her second high-dose-rate treatment directed at the cervical area. The patient was prescribed a dose of 6 Gy to be delivered to the high risk CTV. This was achieved with a total treatment time of 733.2 seconds. 2 channels were used to deliver the patient's treatment. The patient tolerated treatment well. after completion of her therapy a radiation survey was performed documenting return of the iridium source into the gamma med safe. Patient was then transferred to the nursing suite. Her Foley catheter was removed. The patient's rectal paddle ring and tandem system was removed without difficulty. Minimal bleeding was noted. Patient was observed for a period of time and then discharged home. She will continue her external beam treatments at the [facility] tomorrow with 1 more remaining sidewall boost to complete her external  beam treatments. The patient has completed her radiosensitizing chemotherapy.   ________________________________  Blair Promise, PhD, MD

## 2014-04-23 NOTE — Progress Notes (Addendum)
Notified Dr. Sondra Come that patient has only had 50 ml in her foley bag. Increased fluids - LR to 125 ml/hr per Dr. Sondra Come.

## 2014-04-23 NOTE — OR Nursing (Signed)
Removal of previous foley catheter not documented.

## 2014-04-23 NOTE — H&P (View-Only) (Signed)
Radiation Oncology         [phone] ________________________________  History and physical examination note  Name: Erika [name] MRN: [id number]  Date: 03/29/2014  DOB: 1975/09/05  LZ:[id number],TKWI S, MD  No ref. provider found   REFERRING PHYSICIAN: No ref. 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 female who is currently under treatment with external beam radiation therapy at the [facility] in [place].  the patient will be admitted on March 8 for exam under CT anesthesia and placement of a tandem/ringing in preparation for high-dose rate radiation therapy using iridium 192.  patient is tolerating her radiation and radiosensitizing chemotherapy well. She is completed 16 treatments as of February 29 (2880 cGy)  the patient's vaginal bleeding has subsided.. Pelvic exam shows a nice shrinkage of her large cervical mass.  PREVIOUS RADIATION THERAPY: No  PAST MEDICAL HISTORY:  has a past medical history of Cervical cancer.    PAST SURGICAL HISTORY: Past Surgical History  Procedure Laterality Date  . Tubal ligation    . Cesarean section      FAMILY HISTORY: family history is not on file.  SOCIAL HISTORY:  reports that she quit smoking about 2 years ago. Her smoking use included Cigarettes. She has a 2 pack-year smoking history. She does not have any smokeless tobacco history on file. She reports that she drinks about 0.6 oz of alcohol per week. She reports that she uses illicit drugs (Marijuana) about twice per week.  ALLERGIES: Review of patient's allergies indicates no known allergies.  MEDICATIONS:  No current facility-administered medications for this encounter.   Current Outpatient Prescriptions  Medication Sig Dispense Refill  . dexamethasone (DECADRON) 4 MG tablet Take 4 mg by mouth 2 (two) times daily with a meal.    . ferrous sulfate 325 (65 FE) MG tablet Take 325 mg by  mouth daily with breakfast.    . HYDROcodone-acetaminophen (NORCO/VICODIN) 5-325 MG per tablet Take 1 tablet by mouth every 6 (six) hours as needed for moderate pain.    Marland Kitchen LORazepam (ATIVAN) 0.5 MG tablet Take 0.5 mg by mouth every 6 (six) hours as needed (nausea).     . potassium chloride (K-DUR,KLOR-CON) 10 MEQ tablet Take 20 mEq by mouth 3 (three) times daily.    . promethazine (PHENERGAN) 25 MG tablet Take 25 mg by mouth every 6 (six) hours as needed for nausea or vomiting.      REVIEW OF SYSTEMS:  A 15 point review of systems is documented in the electronic medical record. This was obtained by the nursing staff. However, I reviewed this with the patient to discuss relevant findings and make appropriate changes.  She has had some mild nausea with her chemotherapy. No significant problems with loose bowels or diarrhea. The patient has had some problems with low potassium and is on a potassium supplement for this issue   PHYSICAL EXAM: Vital signs temperature 98.4, weight 99 pounds blood pressure 108/69 pulse 95 oxygen saturation 99% on room air. These all vitals are from exam on the 29th   General Appearance:    Alert, cooperative, no distress, appears stated age  Head:    Normocephalic, without obvious abnormality, atraumatic  Eyes:    PERRL, conjunctiva/corneas clear, EOM's intact, f     Ears:    Normal TM's and external ear canals, both ears  Nose:   Nares normal, septum midline, mucosa normal, no drainage  or sinus tenderness  Throat:   Lips, mucosa, and tongue normal; teeth and gums normal  Neck:   Supple, symmetrical, trachea midline, no adenopathy;    thyroid:  no enlargement/tenderness/nodules; no carotid   bruit or JVD  Back:     Symmetric, no curvature, ROM normal, no CVA tenderness  Lungs:     Clear to auscultation bilaterally, respirations unlabored  Chest Wall:    No tenderness or deformity   Heart:    Regular rate and rhythm, S1 and S2 normal, no murmur, rub   or gallop      Abdomen:     Soft, non-tender, bowel sounds active all four quadrants,    no masses, no organomegaly  Genitalia:    Normal female without lesion, discharge or tenderness, cervical mass smaller in size estimated to be approximately 3.5 x 4 cm      Extremities:   Extremities normal, atraumatic, no cyanosis or edema  Pulses:   2+ and symmetric all extremities  Skin:   Skin color, texture, turgor normal, no rashes or lesions  Lymph nodes:   Cervical, supraclavicular, and axillary nodes normal  Neurologic:    normal strength, sensation and reflexes    throughout     ECOG = 1    1 - Symptomatic but completely ambulatory (Restricted in physically strenuous activity but ambulatory and able to carry out work of a light or sedentary nature. For example, light housework, office work)  LABORATORY DATA:  Lab Results  Component Value Date   WBC 5.4 02/20/2014   HGB 7.1* 02/20/2014   HCT 25.0* 02/20/2014   MCV 80.3 02/20/2014   PLT 344 02/20/2014   NEUTROABS 3.2 02/20/2014   Lab Results  Component Value Date   NA 138 02/20/2014   K 3.2* 02/20/2014   CO2 25 02/20/2014   GLUCOSE 89 02/20/2014   CREATININE 0.6 02/20/2014   CALCIUM 8.7 02/20/2014   Laboratory data from [facility] February 29th scanned in  RADIOGRAPHY: No results found.    IMPRESSION: Stage IIB squamous cell carcinoma cervix. Patient is now ready to proceed with high-dose rate brachytherapy as part of her overall management. Patient will be taken to the operating room on March 8 for her first procedure.  The patient was explained this procedure potential complications and side effects and wishes to proceed with this as part of her treatment. ------------------------------------------------  Blair Promise, PhD, MD

## 2014-04-23 NOTE — Brief Op Note (Signed)
04/23/2014  8:52 AM  PATIENT:  Erika [name]  39 y.o. female  PRE-OPERATIVE DIAGNOSIS:  CERVICAL CANCER   POST-OPERATIVE DIAGNOSIS:  cervical cancer  PROCEDURE:  Procedure(s): TANDEM RING PLACEMENT  (N/A)  SURGEON:  Surgeon(s) and Role:    * Gery Pray, MD - Primary  PHYSICIAN ASSISTANT:   ASSISTANTS: none   ANESTHESIA:   general, LMA  EBL:  Total I/O In: 700 [I.V.:700] Out: - 20  BLOOD ADMINISTERED:none  DRAINS: Urinary Catheter (Foley)   LOCAL MEDICATIONS USED:  NONE  SPECIMEN:  No Specimen  DISPOSITION OF SPECIMEN:  N/A  COUNTS:  YES  TOURNIQUET:  * No tourniquets in log *  DICTATION: : Prior to anesthesia timeout was performed. The patient was prepped and draped in the usual sterile fashion and placed in the dorsal lithotomy position. A Foley catheter was inserted without difficulty. On examination the patient's cervical mass had decreased in size and ~ 3 x 3.5 centimeters in size.   The cervical sleeve was noted to be in good position within the cervical region. Sutures were noted . A good view of the cervical sleeve was noted and was in good position within the uterine cavity by ultrasound.  .Patient then had placement of a 45 60 mm tandem within the uterine cavity and inside the cervical sleeve. The 45 ring was placed along the cervix and affixed to the tandem. A small shielding cap was placed along the ring. Patient then had a rectal paddle placed and affixed to the tandem ring apparatus. There was no room for additional packing along the rectal paddle and rectal area. Palpation along the paddle and ring system showed no rectum extending into the high-dose treatment area. The patient tolerated the procedure well. She was transported to the recovery room in stable condition.  PLAN OF CARE: Transferred to radiation oncology for planning and second high-dose-rate treatment  PATIENT DISPOSITION:  PACU - hemodynamically stable.   Delay start of Pharmacological VTE  agent (>24hrs) due to surgical blood loss or risk of bleeding: not applicable

## 2014-04-23 NOTE — Interval H&P Note (Signed)
History and Physical Interval Note:  04/23/2014 7:13 AM  Erika [name]  has presented today for surgery, with the diagnosis of CERVICAL CANCER   The various methods of treatment have been discussed with the patient and family. After consideration of risks, benefits and other options for treatment, the patient has consented to  Procedure(s): TANDEM RING PLACEMENT  (N/A) as a surgical intervention .  The patient's history has been reviewed, patient examined, no change in status, stable for surgery.  I have reviewed the patient's chart and labs.  Questions were answered to the patient's satisfaction.     Gery Pray D

## 2014-04-23 NOTE — Progress Notes (Signed)
IMMEDIATELY FOLLOWING SURGERY: Do not drive or operate machinery for the first twenty four hours after surgery. Do not make any important decisions for twenty four hours after surgery or while taking narcotic pain medications or sedatives. If you develop intractable nausea and vomiting or a severe headache please notify your doctor immediately.   FOLLOW-UP: You do not need to follow up with anesthesia unless specifically instructed to do so.   WOUND CARE INSTRUCTIONS (if applicable): Expect some mild vaginal bleeding, but if large amount of bleeding occurs please contact Dr. Sondra Come at [phone] or the Radiation On-Call physician. Call for any fever greater than 101.0 degrees or increasing vaginal//abdominal pain or trouble urinating.   QUESTIONS?: Please feel free to call your physician or the hospital operator if you have any questions, and they will be happy to assist you. Resume all medications: as listed on your after visit summary.

## 2014-04-23 NOTE — Anesthesia Postprocedure Evaluation (Signed)
  Anesthesia Post-op Note  Patient: Erika [name]  Procedure(s) Performed: Procedure(s) (LRB): TANDEM RING PLACEMENT  (N/A)  Patient Location: PACU  Anesthesia Type: General  Level of Consciousness: awake and alert   Airway and Oxygen Therapy: Patient Spontanous Breathing  Post-op Pain: mild  Post-op Assessment: Post-op Vital signs reviewed, Patient's Cardiovascular Status Stable, Respiratory Function Stable, Patent Airway and No signs of Nausea or vomiting  Last Vitals:  Filed Vitals:   04/23/14 0915  BP: 125/85  Pulse: 88  Temp: 36.7 C  Resp: 19    Post-op Vital Signs: stable   Complications: No apparent anesthesia complications

## 2014-04-23 NOTE — Transfer of Care (Signed)
Immediate Anesthesia Transfer of Care Note  Patient: Erika [name]  Procedure(s) Performed: Procedure(s): TANDEM RING PLACEMENT  (N/A)  Patient Location: PACU  Anesthesia Type:General  Level of Consciousness: awake  Airway & Oxygen Therapy: Patient Spontanous Breathing and Patient connected to face mask oxygen  Post-op Assessment: Report given to RN and Post -op Vital signs reviewed and stable  Post vital signs: Reviewed and stable  Last Vitals:  Filed Vitals:   04/23/14 0513  BP: 105/76  Pulse: 110  Temp: 36.6 C  Resp: 16    Complications: No apparent anesthesia complications

## 2014-04-24 ENCOUNTER — Encounter (HOSPITAL_COMMUNITY): Payer: Self-pay | Admitting: Radiation Oncology

## 2014-04-24 NOTE — Addendum Note (Signed)
Encounter addended by: Jacqulyn Liner, RN on: 04/24/2014  2:43 PM<BR>     Documentation filed: Inpatient Document Flowsheet, Lines/Drains/Airways Properties Editor

## 2014-04-24 NOTE — Progress Notes (Signed)
Need orders in EPIC .  Surgery on 04/30/2014.  Thank You.

## 2014-04-29 ENCOUNTER — Other Ambulatory Visit: Payer: Self-pay

## 2014-04-29 DIAGNOSIS — C539 Malignant neoplasm of cervix uteri, unspecified: Secondary | ICD-10-CM

## 2014-04-30 ENCOUNTER — Encounter (HOSPITAL_COMMUNITY): Payer: Self-pay | Admitting: *Deleted

## 2014-04-30 ENCOUNTER — Ambulatory Visit (HOSPITAL_COMMUNITY): Payer: Medicaid - Out of State | Admitting: Anesthesiology

## 2014-04-30 ENCOUNTER — Ambulatory Visit (HOSPITAL_COMMUNITY)
Admission: RE | Admit: 2014-04-30 | Discharge: 2014-04-30 | Disposition: A | Discharge status: Home / Self Care | Payer: Medicaid - Out of State | Source: Ambulatory Visit | Attending: Radiation Oncology | Admitting: Radiation Oncology

## 2014-04-30 ENCOUNTER — Ambulatory Visit
Admission: RE | Admit: 2014-04-30 | Discharge: 2014-04-30 | Disposition: A | Discharge status: Home / Self Care | Payer: Medicaid - Out of State | Source: Ambulatory Visit | Attending: Radiation Oncology | Admitting: Radiation Oncology

## 2014-04-30 ENCOUNTER — Encounter (HOSPITAL_COMMUNITY)
Admission: RE | Disposition: A | Discharge status: Home / Self Care | Payer: Self-pay | Source: Ambulatory Visit | Attending: Radiation Oncology

## 2014-04-30 VITALS — BP 115/70 | HR 81 | Temp 98.3°F

## 2014-04-30 DIAGNOSIS — D508 Other iron deficiency anemias: Secondary | ICD-10-CM | POA: Diagnosis not present

## 2014-04-30 DIAGNOSIS — C539 Malignant neoplasm of cervix uteri, unspecified: Secondary | ICD-10-CM | POA: Insufficient documentation

## 2014-04-30 DIAGNOSIS — Z9851 Tubal ligation status: Secondary | ICD-10-CM | POA: Diagnosis not present

## 2014-04-30 DIAGNOSIS — Z510 Encounter for antineoplastic radiation therapy: Secondary | ICD-10-CM | POA: Diagnosis not present

## 2014-04-30 DIAGNOSIS — Z87891 Personal history of nicotine dependence: Secondary | ICD-10-CM | POA: Insufficient documentation

## 2014-04-30 HISTORY — PX: TANDEM RING INSERTION: SHX6199

## 2014-04-30 SURGERY — INSERTION, UTERINE TANDEM AND RING OR CYLINDER, FOR BRACHYTHERAPY
Anesthesia: General

## 2014-04-30 MED ORDER — MIDAZOLAM HCL 2 MG/2ML IJ SOLN
INTRAMUSCULAR | Status: AC
  Filled 2014-04-30: qty 2

## 2014-04-30 MED ORDER — HYDROMORPHONE HCL 1 MG/ML IJ SOLN
0.5000 mg | Freq: Once | INTRAMUSCULAR | Status: AC
  Administered 2014-04-30: 0.5 mg via INTRAVENOUS
  Filled 2014-04-30: qty 1

## 2014-04-30 MED ORDER — LIDOCAINE HCL (CARDIAC) 20 MG/ML IV SOLN
INTRAVENOUS | Status: DC | PRN
  Administered 2014-04-30: 50 mg via INTRAVENOUS

## 2014-04-30 MED ORDER — LACTATED RINGERS IV SOLN
INTRAVENOUS | Status: DC
  Filled 2014-04-30: qty 250

## 2014-04-30 MED ORDER — OXYCODONE HCL 5 MG PO TABS: 5.0000 mg | ORAL_TABLET | Freq: Once | ORAL | Status: AC | PRN

## 2014-04-30 MED ORDER — OXYCODONE HCL 5 MG/5ML PO SOLN
5.0000 mg | Freq: Once | ORAL | Status: AC | PRN
  Filled 2014-04-30: qty 5

## 2014-04-30 MED ORDER — LACTATED RINGERS IV SOLN: INTRAVENOUS | Status: DC

## 2014-04-30 MED ORDER — MIDAZOLAM HCL 5 MG/5ML IJ SOLN
INTRAMUSCULAR | Status: DC | PRN
  Administered 2014-04-30: 1 mg via INTRAVENOUS

## 2014-04-30 MED ORDER — ONDANSETRON HCL 4 MG/2ML IJ SOLN
INTRAMUSCULAR | Status: DC | PRN
  Administered 2014-04-30: 4 mg via INTRAVENOUS

## 2014-04-30 MED ORDER — LACTATED RINGERS IV SOLN
INTRAVENOUS | Status: DC | PRN
  Administered 2014-04-30: 07:00:00 via INTRAVENOUS

## 2014-04-30 MED ORDER — HYDROMORPHONE HCL 1 MG/ML IJ SOLN
INTRAMUSCULAR | Status: AC
  Filled 2014-04-30: qty 1

## 2014-04-30 MED ORDER — FENTANYL CITRATE 0.05 MG/ML IJ SOLN
INTRAMUSCULAR | Status: DC | PRN
  Administered 2014-04-30 (×4): 25 ug via INTRAVENOUS

## 2014-04-30 MED ORDER — HYDROMORPHONE HCL 1 MG/ML IJ SOLN
0.2500 mg | INTRAMUSCULAR | Status: DC | PRN
  Administered 2014-04-30 (×2): 0.5 mg via INTRAVENOUS

## 2014-04-30 MED ORDER — WATER FOR IRRIGATION, STERILE IR SOLN
Status: DC | PRN
  Administered 2014-04-30: 1000 mL

## 2014-04-30 MED ORDER — PROPOFOL 10 MG/ML IV BOLUS
INTRAVENOUS | Status: DC | PRN
  Administered 2014-04-30: 100 mg via INTRAVENOUS

## 2014-04-30 MED ORDER — PROMETHAZINE HCL 25 MG/ML IJ SOLN: 6.2500 mg | INTRAMUSCULAR | Status: DC | PRN

## 2014-04-30 MED ORDER — PROPOFOL 10 MG/ML IV BOLUS
INTRAVENOUS | Status: AC
  Filled 2014-04-30: qty 20

## 2014-04-30 MED ORDER — DEXAMETHASONE SODIUM PHOSPHATE 10 MG/ML IJ SOLN
INTRAMUSCULAR | Status: DC | PRN
  Administered 2014-04-30: 5 mg via INTRAVENOUS

## 2014-04-30 MED ORDER — ESTRADIOL 0.1 MG/GM VA CREA
TOPICAL_CREAM | VAGINAL | Status: AC
  Filled 2014-04-30: qty 42.5

## 2014-04-30 MED ORDER — FENTANYL CITRATE 0.05 MG/ML IJ SOLN
INTRAMUSCULAR | Status: AC
  Filled 2014-04-30: qty 2

## 2014-04-30 SURGICAL SUPPLY — 26 items
BAG URINE DRAINAGE (UROLOGICAL SUPPLIES) ×3 IMPLANT
BNDG CONFORM 2 STRL LF (GAUZE/BANDAGES/DRESSINGS) IMPLANT
CATH FOLEY 2WAY SLVR  5CC 16FR (CATHETERS) ×2
CATH FOLEY 2WAY SLVR 5CC 16FR (CATHETERS) ×1 IMPLANT
DRAPE SHEET LG 3/4 BI-LAMINATE (DRAPES) ×3 IMPLANT
DRAPE TABLE BACK 44X90 PK DISP (DRAPES) ×3 IMPLANT
DRSG PAD ABDOMINAL 8X10 ST (GAUZE/BANDAGES/DRESSINGS) IMPLANT
GLOVE BIO SURGEON STRL SZ7.5 (GLOVE) ×3 IMPLANT
GOWN STRL REUS W/TWL LRG LVL3 (GOWN DISPOSABLE) ×6 IMPLANT
HOLDER FOLEY CATH W/STRAP (MISCELLANEOUS) ×3 IMPLANT
KIT BASIN OR (CUSTOM PROCEDURE TRAY) ×3 IMPLANT
LEGGING LITHOTOMY PAIR STRL (DRAPES) ×3 IMPLANT
NEEDLE SPNL 22GX3.5 QUINCKE BK (NEEDLE) IMPLANT
PAD ABD 8X10 STRL (GAUZE/BANDAGES/DRESSINGS) ×3 IMPLANT
PAD OB MATERNITY 4.3X12.25 (PERSONAL CARE ITEMS) ×3 IMPLANT
PLUG CATH AND CAP STER (CATHETERS) IMPLANT
SUT PROLENE 0 SH 30 (SUTURE) IMPLANT
SUT SILK 2 0 30  PSL (SUTURE)
SUT SILK 2 0 30 PSL (SUTURE) IMPLANT
SYR BULB IRRIGATION 50ML (SYRINGE) IMPLANT
SYR CONTROL 10ML LL (SYRINGE) IMPLANT
SYRINGE 10CC LL (SYRINGE) ×3 IMPLANT
TOWEL OR 17X26 10 PK STRL BLUE (TOWEL DISPOSABLE) ×3 IMPLANT
UNDERPAD 30X30 INCONTINENT (UNDERPADS AND DIAPERS) ×3 IMPLANT
WATER STERILE IRR 3000ML UROMA (IV SOLUTION) IMPLANT
WATER STERILE IRR 500ML POUR (IV SOLUTION) ×3 IMPLANT

## 2014-04-30 NOTE — Progress Notes (Signed)
125 cc yellow urine from collection bag.  Removed Foley intact.  Assisted Dr. Sondra Come with tandem equipment removal.  Removed #20 gauge IV from patient's right hand intact.  Pressure and bandaid applied.  Gave patient discharge paperwork and instructions.  Patient escorted out of the clinic in a wheelchair by her fiance.

## 2014-04-30 NOTE — Transfer of Care (Signed)
Immediate Anesthesia Transfer of Care Note  Patient: Erika [name]  Procedure(s) Performed: Procedure(s) (LRB): TANDEM RING PLACEMENT,EXAM UNDER ANESTHESIA  (N/A)  Patient Location: PACU  Anesthesia Type: General  Level of Consciousness: sedated, patient cooperative and responds to stimulation  Airway & Oxygen Therapy: Patient Spontanous Breathing and Patient connected to face mask oxgen  Post-op Assessment: Report given to PACU RN and Post -op Vital signs reviewed and stable  Post vital signs: Reviewed and stable  Complications: No apparent anesthesia complications

## 2014-04-30 NOTE — Anesthesia Postprocedure Evaluation (Signed)
  Anesthesia Post-op Note  Patient: Erika [name]  Procedure(s) Performed: Procedure(s): TANDEM RING PLACEMENT,EXAM UNDER ANESTHESIA  (N/A)  Patient Location: PACU  Anesthesia Type:General  Level of Consciousness: awake and alert   Airway and Oxygen Therapy: Patient Spontanous Breathing  Post-op Pain: none  Post-op Assessment: Patient's Cardiovascular Status Stable  Post-op Vital Signs: Reviewed  Last Vitals:  Filed Vitals:   04/30/14 0857  BP: 127/82  Pulse: 77  Temp: 36.4 C  Resp: 14    Complications: No apparent anesthesia complications

## 2014-04-30 NOTE — H&P (View-Only) (Signed)
Radiation Oncology         [phone]) [phone] ________________________________  History and physical examination note  Name: Erika [name] MRN: [id number]  Date: 03/29/2014  DOB: 05/07/1975  WU:[id number],GNFA S, MD  No ref. provider found   REFERRING PHYSICIAN: No ref. 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 female who is currently under treatment with external beam radiation therapy at the [facility] in [place].  the patient will be admitted on March 8 for exam under CT anesthesia and placement of a tandem/ringing in preparation for high-dose rate radiation therapy using iridium 192.  patient is tolerating her radiation and radiosensitizing chemotherapy well. She is completed 16 treatments as of February 29 (2880 cGy)  the patient's vaginal bleeding has subsided.. Pelvic exam shows a nice shrinkage of her large cervical mass.  PREVIOUS RADIATION THERAPY: No  PAST MEDICAL HISTORY:  has a past medical history of Cervical cancer.    PAST SURGICAL HISTORY: Past Surgical History  Procedure Laterality Date  . Tubal ligation    . Cesarean section      FAMILY HISTORY: family history is not on file.  SOCIAL HISTORY:  reports that she quit smoking about 2 years ago. Her smoking use included Cigarettes. She has a 2 pack-year smoking history. She does not have any smokeless tobacco history on file. She reports that she drinks about 0.6 oz of alcohol per week. She reports that she uses illicit drugs (Marijuana) about twice per week.  ALLERGIES: Review of patient's allergies indicates no known allergies.  MEDICATIONS:  No current facility-administered medications for this encounter.   Current Outpatient Prescriptions  Medication Sig Dispense Refill  . dexamethasone (DECADRON) 4 MG tablet Take 4 mg by mouth 2 (two) times daily with a meal.    . ferrous sulfate 325 (65 FE) MG tablet Take 325 mg by  mouth daily with breakfast.    . HYDROcodone-acetaminophen (NORCO/VICODIN) 5-325 MG per tablet Take 1 tablet by mouth every 6 (six) hours as needed for moderate pain.    Marland Kitchen LORazepam (ATIVAN) 0.5 MG tablet Take 0.5 mg by mouth every 6 (six) hours as needed (nausea).     . potassium chloride (K-DUR,KLOR-CON) 10 MEQ tablet Take 20 mEq by mouth 3 (three) times daily.    . promethazine (PHENERGAN) 25 MG tablet Take 25 mg by mouth every 6 (six) hours as needed for nausea or vomiting.      REVIEW OF SYSTEMS:  A 15 point review of systems is documented in the electronic medical record. This was obtained by the nursing staff. However, I reviewed this with the patient to discuss relevant findings and make appropriate changes.  She has had some mild nausea with her chemotherapy. No significant problems with loose bowels or diarrhea. The patient has had some problems with low potassium and is on a potassium supplement for this issue   PHYSICAL EXAM: Vital signs temperature 98.4, weight 99 pounds blood pressure 108/69 pulse 95 oxygen saturation 99% on room air. These all vitals are from exam on the 29th   General Appearance:    Alert, cooperative, no distress, appears stated age  Head:    Normocephalic, without obvious abnormality, atraumatic  Eyes:    PERRL, conjunctiva/corneas clear, EOM's intact, f     Ears:    Normal TM's and external ear canals, both ears  Nose:   Nares normal, septum midline, mucosa normal, no drainage  or sinus tenderness  Throat:   Lips, mucosa, and tongue normal; teeth and gums normal  Neck:   Supple, symmetrical, trachea midline, no adenopathy;    thyroid:  no enlargement/tenderness/nodules; no carotid   bruit or JVD  Back:     Symmetric, no curvature, ROM normal, no CVA tenderness  Lungs:     Clear to auscultation bilaterally, respirations unlabored  Chest Wall:    No tenderness or deformity   Heart:    Regular rate and rhythm, S1 and S2 normal, no murmur, rub   or gallop      Abdomen:     Soft, non-tender, bowel sounds active all four quadrants,    no masses, no organomegaly  Genitalia:    Normal female without lesion, discharge or tenderness, cervical mass smaller in size estimated to be approximately 3.5 x 4 cm      Extremities:   Extremities normal, atraumatic, no cyanosis or edema  Pulses:   2+ and symmetric all extremities  Skin:   Skin color, texture, turgor normal, no rashes or lesions  Lymph nodes:   Cervical, supraclavicular, and axillary nodes normal  Neurologic:    normal strength, sensation and reflexes    throughout     ECOG = 1    1 - Symptomatic but completely ambulatory (Restricted in physically strenuous activity but ambulatory and able to carry out work of a light or sedentary nature. For example, light housework, office work)  LABORATORY DATA:  Lab Results  Component Value Date   WBC 5.4 02/20/2014   HGB 7.1* 02/20/2014   HCT 25.0* 02/20/2014   MCV 80.3 02/20/2014   PLT 344 02/20/2014   NEUTROABS 3.2 02/20/2014   Lab Results  Component Value Date   NA 138 02/20/2014   K 3.2* 02/20/2014   CO2 25 02/20/2014   GLUCOSE 89 02/20/2014   CREATININE 0.6 02/20/2014   CALCIUM 8.7 02/20/2014   Laboratory data from [facility] February 29th scanned in  RADIOGRAPHY: No results found.    IMPRESSION: Stage IIB squamous cell carcinoma cervix. Patient is now ready to proceed with high-dose rate brachytherapy as part of her overall management. Patient will be taken to the operating room on March 8 for her first procedure.  The patient was explained this procedure potential complications and side effects and wishes to proceed with this as part of her treatment. ------------------------------------------------  Blair Promise, PhD, MD

## 2014-04-30 NOTE — Progress Notes (Signed)
  Radiation Oncology         [phone]) [phone] ________________________________  Name: Erika [name] MRN: [id number]  Date: 04/30/2014  DOB: February 15, 1975  SIMULATION AND TREATMENT PLANNING NOTE HDR BRACHYTHERAPY  DIAGNOSIS:  Stage II-B squamous cell cervical cancer  NARRATIVE:  The patient was brought to the [place] suite.  Identity was confirmed.  All relevant records and images related to the planned course of therapy were reviewed.  The patient freely provided informed written consent to proceed with treatment after reviewing the details related to the planned course of therapy. The consent form was witnessed and verified by the simulation staff.  Then, the patient was set-up in a stable reproducible  supine position for radiation therapy. The patient also had fiducial markers placed within the tandem/ring system.  CT images were obtained.  Surface markings were placed.  The CT images were loaded into the planning software.  Then the target and avoidance structures were contoured.  Treatment planning then occurred.  The radiation prescription was entered and confirmed.   I have requested : Brachytherapy Isodose Plan and Dosimetry Calculations to plan the radiation distribution.    PLAN:  The patient will receive 5.5 Gy in 1 fraction.    ________________________________  Blair Promise, PhD, MD

## 2014-04-30 NOTE — Progress Notes (Signed)
Erika [name] reports her pain is now at a 4-5/10.  Will continue to monitor.

## 2014-04-30 NOTE — Progress Notes (Signed)
Erika [name] back from CT SIM.  Changed her IV from gravity to the IV pump infusing at 50 ml/hr into her right hand IV.  She denies pain but reports she is sore today.  Call light in reach.  Will continue to monitor.

## 2014-04-30 NOTE — Progress Notes (Signed)
  Radiation Oncology         [phone]) [phone] ________________________________  Name: Erika [name] MRN: [id number]  Date: 04/30/2014  DOB: 03-12-1975   HDR BRACHYTHERAPY  DIAGNOSIS:  Stage II-B Squamous Cell carcinoma of the Cervix   NARRATIVE: After planning was complete the patient was transferred to the high-dose-rate suite. A fiducial marker was placed within the tandem and ring system.   Verification simulation note  An AP and lateral film was obtained. . This verified accurate position of the tandem and ring system as planned earlier in the day.  Simple treatment device note  While in the Operating room the patient had construction of her custom ring/ tandem system. The patient will be treated with a 45 orientation, 60 mm tandem. A 45 orientation ring will also be used for treatment. The small shielding cap was placed over the ring system. The patient's setup also included a rectal paddle to push the rectum out of the high-dose radiation field  High-dose-rate brachytherapy treatment  The remote afterloading device was affixed to the tandem/ring system by catheter. The patient then proceeded to undergo her third high-dose-rate treatment directed at the cervical area. The patient was prescribed a dose of 6 Gy to be delivered to the high risk CTV. This was achieved with a total treatment time of 695.5 seconds. 2 channels were used to deliver the patient's treatment. The patient tolerated treatment well. after completion of her therapy a radiation survey was performed documenting return of the iridium source into the gamma med safe. Patient was then transferred to the nursing suite. Her Foley catheter was removed. The patient's rectal paddle ring and tandem system was removed without difficulty. Minimal bleeding was noted. Patient was observed for a period of time and then discharged home.  ________________________________  Blair Promise, PhD, MD

## 2014-04-30 NOTE — Anesthesia Preprocedure Evaluation (Addendum)
Anesthesia Evaluation  Patient identified by MRN, date of birth, ID band Patient awake    Reviewed: Allergy & Precautions, NPO status , Patient's Chart, lab work & pertinent test results  Airway Mallampati: II  TM Distance: >3 FB Neck ROM: Full    Dental no notable dental hx. (+) Chipped   Pulmonary neg pulmonary ROS, former smoker,  breath sounds clear to auscultation  Pulmonary exam normal       Cardiovascular negative cardio ROS  Rhythm:Regular Rate:Normal     Neuro/Psych negative neurological ROS  negative psych ROS   GI/Hepatic negative GI ROS, Neg liver ROS,   Endo/Other  negative endocrine ROS  Renal/GU negative Renal ROS  negative genitourinary   Musculoskeletal negative musculoskeletal ROS (+)   Abdominal   Peds negative pediatric ROS (+)  Hematology  (+) anemia ,   Anesthesia Other Findings   Reproductive/Obstetrics negative OB ROS                            Anesthesia Physical  Anesthesia Plan  ASA: II  Anesthesia Plan: General   Post-op Pain Management:    Induction: Intravenous  Airway Management Planned: LMA  Additional Equipment:   Intra-op Plan:   Post-operative Plan: Extubation in OR  Informed Consent: I have reviewed the patients History and Physical, chart, labs and discussed the procedure including the risks, benefits and alternatives for the proposed anesthesia with the patient or authorized representative who has indicated his/her understanding and acceptance.   Dental advisory given  Plan Discussed with: CRNA  Anesthesia Plan Comments:         Anesthesia Quick Evaluation

## 2014-04-30 NOTE — Anesthesia Procedure Notes (Signed)
Procedure Name: LMA Insertion Date/Time: 04/30/2014 7:22 AM Performed by: Anne Fu Pre-anesthesia Checklist: Patient identified, Emergency Drugs available, Suction available, Patient being monitored and Timeout performed Patient Re-evaluated:Patient Re-evaluated prior to inductionOxygen Delivery Method: Circle system utilized Preoxygenation: Pre-oxygenation with 100% oxygen Intubation Type: IV induction Ventilation: Mask ventilation without difficulty LMA: LMA inserted LMA Size: 3.0 Number of attempts: 1 Placement Confirmation: positive ETCO2 and breath sounds checked- equal and bilateral Tube secured with: Tape

## 2014-04-30 NOTE — Progress Notes (Signed)
Received report from [place], [place] in PACU.  Erika [name] has a right hand IV with LR infusing to gravity.  She has a foley catheter draining clear, yellow urine.  She has SCD's in place.  She denies pain.  Transported her to CT SIM along with Gaspar Garbe, RN.

## 2014-04-30 NOTE — Progress Notes (Signed)
100 cc in foley collection bag.  Increased LR infusion to 125 ml/hr per Dr. Sondra Come.

## 2014-04-30 NOTE — Brief Op Note (Signed)
04/30/2014  8:15 AM  PATIENT:  Erika [name]  39 y.o. female  PRE-OPERATIVE DIAGNOSIS:  CERVICAL CANCER  POST-OPERATIVE DIAGNOSIS:  CERVICAL CANCER  PROCEDURE:  Procedure(s): TANDEM RING PLACEMENT,EXAM UNDER ANESTHESIA  (N/A)  SURGEON:  Surgeon(s) and Role:    * Gery Pray, MD - Primary  PHYSICIAN ASSISTANT:   ASSISTANTS: none   ANESTHESIA:   general, LMA  EBL:  Total I/O In: 400 [I.V.:400] Out: - 100  BLOOD ADMINISTERED:none  DRAINS: Urinary Catheter (Foley)   LOCAL MEDICATIONS USED:  NONE  SPECIMEN:  No Specimen  DISPOSITION OF SPECIMEN:  N/A  COUNTS:  YES  TOURNIQUET:  * No tourniquets in log *  DICTATION: Prior to anesthesia timeout was performed. The patient was prepped and draped in the usual sterile fashion and placed in the dorsal lithotomy position. A Foley catheter was inserted without difficulty. On examination the patient's cervical mass had decreased in size to  ~ 3 x 3.0 centimeters.No parametrial involvement noted on clinical exam. The cervical sleeve was noted to be in good position within the cervical region. Sutures were noted. Patient then had placement of a 45 60 mm tandem within the uterine cavity and inside the cervical sleeve. The 45 ring was placed along the cervix and affixed to the tandem. A small shielding cap was placed along the ring. Patient then had a rectal paddle placed and affixed to the tandem ring apparatus. There was no room for additional packing along the rectal paddle and rectal area. Palpation along the paddle and ring system showed no rectum extending into the high-dose treatment area. The patient tolerated the procedure well. She was transported to the recovery room in stable condition.  PLAN OF CARE: Transfer to radiation oncology for planning and treatment  PATIENT DISPOSITION:  PACU - hemodynamically stable.   Delay start of Pharmacological VTE agent (>24hrs) due to surgical blood loss or risk of bleeding: not  applicable

## 2014-04-30 NOTE — Interval H&P Note (Signed)
History and Physical Interval Note:  04/30/2014 7:07 AM  Erika [name]  has presented today for surgery, with the diagnosis of CERVICAL CANCER  The various methods of treatment have been discussed with the patient and family. After consideration of risks, benefits and other options for treatment, the patient has consented to  Procedure(s): TANDEM RING PLACEMENT  (N/A) as a surgical intervention .  The patient's history has been reviewed, patient examined, no change in status, stable for surgery.  I have reviewed the patient's chart and labs.  Questions were answered to the patient's satisfaction.     Gery Pray D

## 2014-04-30 NOTE — Progress Notes (Signed)
Patient rating pain at a 8/10 in her vaginal area.  0.5 mg dilaudid given IV per Dr. Sondra Come.  Will continue to monitor.

## 2014-04-30 NOTE — Progress Notes (Signed)
IMMEDIATELY FOLLOWING SURGERY: Do not drive or operate machinery for the first twenty four hours after surgery. Do not make any important decisions for twenty four hours after surgery or while taking narcotic pain medications or sedatives. If you develop intractable nausea and vomiting or a severe headache please notify your doctor immediately.   FOLLOW-UP: You do not need to follow up with anesthesia unless specifically instructed to do so.   WOUND CARE INSTRUCTIONS (if applicable): Expect some mild vaginal bleeding, but if large amount of bleeding occurs please contact Dr. Sondra Come at [phone] or the Radiation On-Call physician. Call for any fever greater than 101.0 degrees or increasing vaginal//abdominal pain or trouble urinating.   QUESTIONS?: Please feel free to call your physician or the hospital operator if you have any questions, and they will be happy to assist you. Resume all medications: as listed on your after visit summary. Your next appointment is:  Future Appointments Date Time Provider [place]  05/07/2014 9:00 AM Gery Pray, MD [facility] None  05/07/2014 2:30 PM Gery Pray, MD [facility] None

## 2014-05-01 ENCOUNTER — Encounter (HOSPITAL_COMMUNITY): Payer: Self-pay | Admitting: Radiation Oncology

## 2014-05-01 NOTE — Progress Notes (Signed)
Please put orders in Epic surgery 05-07-14 Same day Thanks

## 2014-05-07 ENCOUNTER — Ambulatory Visit
Admission: RE | Admit: 2014-05-07 | Discharge: 2014-05-07 | Disposition: A | Discharge status: Home / Self Care | Payer: Medicaid - Out of State | Source: Ambulatory Visit | Attending: Radiation Oncology | Admitting: Radiation Oncology

## 2014-05-07 ENCOUNTER — Encounter (HOSPITAL_COMMUNITY): Payer: Self-pay

## 2014-05-07 ENCOUNTER — Ambulatory Visit (HOSPITAL_COMMUNITY): Payer: Medicaid - Out of State | Admitting: Certified Registered Nurse Anesthetist

## 2014-05-07 ENCOUNTER — Encounter: Payer: Self-pay | Admitting: Radiation Oncology

## 2014-05-07 ENCOUNTER — Encounter (HOSPITAL_COMMUNITY)
Admission: RE | Disposition: A | Discharge status: Home / Self Care | Payer: Self-pay | Source: Ambulatory Visit | Attending: Radiation Oncology

## 2014-05-07 ENCOUNTER — Ambulatory Visit (HOSPITAL_COMMUNITY)
Admission: RE | Admit: 2014-05-07 | Discharge: 2014-05-07 | Disposition: A | Discharge status: Home / Self Care | Payer: Medicaid - Out of State | Source: Ambulatory Visit | Attending: Radiation Oncology | Admitting: Radiation Oncology

## 2014-05-07 VITALS — BP 115/66 | HR 80 | Temp 98.0°F | Resp 16

## 2014-05-07 DIAGNOSIS — C539 Malignant neoplasm of cervix uteri, unspecified: Secondary | ICD-10-CM | POA: Diagnosis present

## 2014-05-07 DIAGNOSIS — F1290 Cannabis use, unspecified, uncomplicated: Secondary | ICD-10-CM | POA: Diagnosis not present

## 2014-05-07 DIAGNOSIS — Z87891 Personal history of nicotine dependence: Secondary | ICD-10-CM | POA: Insufficient documentation

## 2014-05-07 DIAGNOSIS — Z9851 Tubal ligation status: Secondary | ICD-10-CM | POA: Diagnosis not present

## 2014-05-07 DIAGNOSIS — Z510 Encounter for antineoplastic radiation therapy: Secondary | ICD-10-CM | POA: Diagnosis not present

## 2014-05-07 DIAGNOSIS — D509 Iron deficiency anemia, unspecified: Secondary | ICD-10-CM | POA: Diagnosis not present

## 2014-05-07 DIAGNOSIS — F419 Anxiety disorder, unspecified: Secondary | ICD-10-CM | POA: Insufficient documentation

## 2014-05-07 HISTORY — PX: TANDEM RING INSERTION: SHX6199

## 2014-05-07 SURGERY — INSERTION, UTERINE TANDEM AND RING OR CYLINDER, FOR BRACHYTHERAPY
Anesthesia: General | Site: Cervix

## 2014-05-07 MED ORDER — MIDAZOLAM HCL 2 MG/2ML IJ SOLN
INTRAMUSCULAR | Status: AC
  Filled 2014-05-07: qty 2

## 2014-05-07 MED ORDER — ONDANSETRON HCL 4 MG/2ML IJ SOLN
INTRAMUSCULAR | Status: AC
  Filled 2014-05-07: qty 2

## 2014-05-07 MED ORDER — HYDROMORPHONE HCL 1 MG/ML IJ SOLN
0.5000 mg | Freq: Once | INTRAMUSCULAR | Status: AC
  Administered 2014-05-07: 0.5 mg via INTRAVENOUS
  Filled 2014-05-07: qty 1

## 2014-05-07 MED ORDER — HYDROMORPHONE HCL 1 MG/ML IJ SOLN
0.5000 mg | Freq: Once | INTRAMUSCULAR | Status: DC
  Filled 2014-05-07: qty 1

## 2014-05-07 MED ORDER — HYDROMORPHONE HCL 1 MG/ML IJ SOLN
INTRAMUSCULAR | Status: AC
  Filled 2014-05-07: qty 1

## 2014-05-07 MED ORDER — MIDAZOLAM HCL 5 MG/5ML IJ SOLN
INTRAMUSCULAR | Status: DC | PRN
  Administered 2014-05-07: 1 mg via INTRAVENOUS

## 2014-05-07 MED ORDER — WATER FOR IRRIGATION, STERILE IR SOLN
Status: DC | PRN
  Administered 2014-05-07: 10 mL

## 2014-05-07 MED ORDER — HYDROCODONE-ACETAMINOPHEN 5-325 MG PO TABS: 1.0000 | ORAL_TABLET | Freq: Four times a day (QID) | ORAL | Status: DC | PRN

## 2014-05-07 MED ORDER — DEXAMETHASONE SODIUM PHOSPHATE 10 MG/ML IJ SOLN
INTRAMUSCULAR | Status: AC
  Filled 2014-05-07: qty 1

## 2014-05-07 MED ORDER — PHENYLEPHRINE 40 MCG/ML (10ML) SYRINGE FOR IV PUSH (FOR BLOOD PRESSURE SUPPORT)
PREFILLED_SYRINGE | INTRAVENOUS | Status: AC
  Filled 2014-05-07: qty 10

## 2014-05-07 MED ORDER — ONDANSETRON HCL 4 MG/2ML IJ SOLN: 4.0000 mg | Freq: Once | INTRAMUSCULAR | Status: AC | PRN

## 2014-05-07 MED ORDER — PROPOFOL 10 MG/ML IV BOLUS
INTRAVENOUS | Status: DC | PRN
  Administered 2014-05-07: 130 mg via INTRAVENOUS

## 2014-05-07 MED ORDER — LACTATED RINGERS IV SOLN
INTRAVENOUS | Status: DC | PRN
  Administered 2014-05-07: 07:00:00 via INTRAVENOUS

## 2014-05-07 MED ORDER — MEPERIDINE HCL 50 MG/ML IJ SOLN: 6.2500 mg | INTRAMUSCULAR | Status: DC | PRN

## 2014-05-07 MED ORDER — HYDROMORPHONE HCL 1 MG/ML IJ SOLN
0.2500 mg | INTRAMUSCULAR | Status: DC | PRN
  Administered 2014-05-07 (×2): 0.5 mg via INTRAVENOUS

## 2014-05-07 MED ORDER — FENTANYL CITRATE 0.05 MG/ML IJ SOLN
INTRAMUSCULAR | Status: DC | PRN
  Administered 2014-05-07 (×4): 25 ug via INTRAVENOUS

## 2014-05-07 MED ORDER — DEXAMETHASONE SODIUM PHOSPHATE 10 MG/ML IJ SOLN
INTRAMUSCULAR | Status: DC | PRN
  Administered 2014-05-07: 10 mg via INTRAVENOUS

## 2014-05-07 MED ORDER — ONDANSETRON HCL 4 MG/2ML IJ SOLN
INTRAMUSCULAR | Status: DC | PRN
  Administered 2014-05-07: 4 mg via INTRAVENOUS

## 2014-05-07 MED ORDER — LIDOCAINE HCL (CARDIAC) 20 MG/ML IV SOLN
INTRAVENOUS | Status: DC | PRN
  Administered 2014-05-07: 100 mg via INTRAVENOUS

## 2014-05-07 MED ORDER — LIDOCAINE HCL (CARDIAC) 20 MG/ML IV SOLN
INTRAVENOUS | Status: AC
  Filled 2014-05-07: qty 5

## 2014-05-07 MED ORDER — PROPOFOL 10 MG/ML IV BOLUS
INTRAVENOUS | Status: AC
Start: 2014-05-07 — End: 2014-05-07
  Filled 2014-05-07: qty 20

## 2014-05-07 MED ORDER — FENTANYL CITRATE 0.05 MG/ML IJ SOLN
INTRAMUSCULAR | Status: AC
  Filled 2014-05-07: qty 2

## 2014-05-07 SURGICAL SUPPLY — 28 items
BAG URINE DRAINAGE (UROLOGICAL SUPPLIES) ×3 IMPLANT
CATH FOLEY 2WAY SLVR  5CC 16FR (CATHETERS) ×2
CATH FOLEY 2WAY SLVR 5CC 16FR (CATHETERS) ×1 IMPLANT
DRAPE SHEET LG 3/4 BI-LAMINATE (DRAPES) ×3 IMPLANT
DRAPE TABLE BACK 44X90 PK DISP (DRAPES) ×3 IMPLANT
GAUZE SPONGE 4X4 16PLY XRAY LF (GAUZE/BANDAGES/DRESSINGS) ×3 IMPLANT
GLOVE BIO SURGEON STRL SZ 6 (GLOVE) ×6 IMPLANT
GLOVE BIO SURGEON STRL SZ7 (GLOVE) ×3 IMPLANT
GLOVE BIO SURGEON STRL SZ7.5 (GLOVE) ×6 IMPLANT
GLOVE BIOGEL PI IND STRL 6.5 (GLOVE) ×1 IMPLANT
GLOVE BIOGEL PI IND STRL 7.0 (GLOVE) ×1 IMPLANT
GLOVE BIOGEL PI INDICATOR 6.5 (GLOVE) ×2
GLOVE BIOGEL PI INDICATOR 7.0 (GLOVE) ×2
GOWN STRL REUS W/TWL LRG LVL3 (GOWN DISPOSABLE) ×9 IMPLANT
HOLDER FOLEY CATH W/STRAP (MISCELLANEOUS) ×3 IMPLANT
KIT BASIN OR (CUSTOM PROCEDURE TRAY) ×3 IMPLANT
LEGGING LITHOTOMY PAIR STRL (DRAPES) ×3 IMPLANT
LUBRICANT JELLY K Y 4OZ (MISCELLANEOUS) ×3 IMPLANT
PAD ABD 8X10 STRL (GAUZE/BANDAGES/DRESSINGS) ×3 IMPLANT
PLUG CATH AND CAP STER (CATHETERS) IMPLANT
SET CYSTO W/LG BORE CLAMP LF (SET/KITS/TRAYS/PACK) IMPLANT
SUT VIC AB 0 CT1 27 (SUTURE) ×6
SUT VIC AB 0 CT1 27XBRD ANTBC (SUTURE) ×3 IMPLANT
SYRINGE 10CC LL (SYRINGE) ×3 IMPLANT
TOWEL OR 17X26 10 PK STRL BLUE (TOWEL DISPOSABLE) ×6 IMPLANT
UNDERPAD 30X30 INCONTINENT (UNDERPADS AND DIAPERS) ×6 IMPLANT
WATER STERILE IRR 500ML POUR (IV SOLUTION) ×3 IMPLANT
YANKAUER SUCT BULB TIP 10FT TU (MISCELLANEOUS) ×3 IMPLANT

## 2014-05-07 NOTE — Anesthesia Procedure Notes (Signed)
Procedure Name: LMA Insertion Date/Time: 05/07/2014 7:32 AM Performed by: Maxwell Caul Pre-anesthesia Checklist: Patient identified, Emergency Drugs available, Suction available, Patient being monitored and Timeout performed Oxygen Delivery Method: Circle system utilized Preoxygenation: Pre-oxygenation with 100% oxygen Intubation Type: IV induction LMA: LMA inserted LMA Size: 3.0 Tube secured with: Tape Dental Injury: Teeth and Oropharynx as per pre-operative assessment

## 2014-05-07 NOTE — Progress Notes (Signed)
Patient states there isn't anyway she could be pregnant

## 2014-05-07 NOTE — Anesthesia Preprocedure Evaluation (Signed)
Anesthesia Evaluation  Patient identified by MRN, date of birth, ID band Patient awake    Reviewed: Allergy & Precautions, NPO status , Patient's Chart, lab work & pertinent test results  Airway Mallampati: I  TM Distance: >3 FB Neck ROM: Full    Dental   Pulmonary former smoker,          Cardiovascular     Neuro/Psych    GI/Hepatic   Endo/Other    Renal/GU      Musculoskeletal   Abdominal   Peds  Hematology   Anesthesia Other Findings   Reproductive/Obstetrics                             Anesthesia Physical Anesthesia Plan  ASA: II  Anesthesia Plan: General   Post-op Pain Management:    Induction: Intravenous  Airway Management Planned: LMA  Additional Equipment:   Intra-op Plan:   Post-operative Plan: Extubation in OR  Informed Consent: I have reviewed the patients History and Physical, chart, labs and discussed the procedure including the risks, benefits and alternatives for the proposed anesthesia with the patient or authorized representative who has indicated his/her understanding and acceptance.     Plan Discussed with: Surgeon and CRNA  Anesthesia Plan Comments:         Anesthesia Quick Evaluation

## 2014-05-07 NOTE — Transfer of Care (Signed)
Immediate Anesthesia Transfer of Care Note  Patient: Erika [name]  Procedure(s) Performed: Procedure(s) (LRB): TANDEM RING PLACEMENT (N/A)  Patient Location: PACU  Anesthesia Type: General  Level of Consciousness: sedated, patient cooperative and responds to stimulation  Airway & Oxygen Therapy: Patient Spontanous Breathing and Patient connected to face mask oxgen  Post-op Assessment: Report given to PACU RN and Post -op Vital signs reviewed and stable  Post vital signs: Reviewed and stable  Complications: No apparent anesthesia complications

## 2014-05-07 NOTE — Progress Notes (Signed)
250 cc in collection bag of urine.  Removed foley catheter intact.  Assisted Dr. Sondra Come with tandem equipment removal.  Removed left hand iv intact.  Pressure and band aid applied.  Patient given discharge instructions and paperwork.

## 2014-05-07 NOTE — Progress Notes (Signed)
  Radiation Oncology [phone] ________________________________  Name: Erika [name]: [id number] Date: 4/5/2016DOB: [birth year]   HDR BRACHYTHERAPY  DIAGNOSIS: Stage II-B Squamous Cell carcinoma of the Cervix   NARRATIVE: After planning was complete the patient was transferred to the high-dose-rate suite. A fiducial marker was placed within the tandem and ring system.   Verification simulation note  An AP and lateral film was obtained. . This verified accurate position of the tandem and ring system as planned earlier in the day.  Simple treatment device note  While in the Operating room the patient had construction of her custom ring/ tandem system. The patient will be treated with a 45 orientation, 60 mm tandem. A 45 orientation ring will also be used for treatment. The small shielding cap was placed over the ring system. The patient's setup also included a rectal paddle to push the rectum out of the high-dose radiation field  High-dose-rate brachytherapy treatment  The remote afterloading device was affixed to the tandem/ring system by catheter. The patient then proceeded to undergo her fourth high-dose-rate treatment directed at the cervical area. The patient was prescribed a dose of 6 Gy to be delivered to the high risk CTV. This was achieved with a total treatment time of 216.34 seconds. 2 channels were used to deliver the patient's treatment. The patient tolerated treatment well. after completion of her therapy a radiation survey was performed documenting return of the iridium source into the gamma med safe. Patient was then transferred to the nursing suite. Her Foley catheter was removed. The patient's rectal paddle ring and tandem system was removed without difficulty. Minimal bleeding was noted. Patient was observed for a period of time and then discharged home.  ________________________________  Blair Promise, PhD, MD

## 2014-05-07 NOTE — Progress Notes (Signed)
Patient left the clinic in a wheelchair escorted by her fiance.

## 2014-05-07 NOTE — Progress Notes (Signed)
IMMEDIATELY FOLLOWING SURGERY: Do not drive or operate machinery for the first twenty four hours after surgery. Do not make any important decisions for twenty four hours after surgery or while taking narcotic pain medications or sedatives. If you develop intractable nausea and vomiting or a severe headache please notify your doctor immediately.   FOLLOW-UP: You do not need to follow up with anesthesia unless specifically instructed to do so.   WOUND CARE INSTRUCTIONS (if applicable): Expect some mild vaginal bleeding, but if large amount of bleeding occurs please contact Dr. Sondra Come at [phone] or the Radiation On-Call physician. Call for any fever greater than 101.0 degrees or increasing vaginal//abdominal pain or trouble urinating.   QUESTIONS?: Please feel free to call your physician or the hospital operator if you have any questions, and they will be happy to assist you. Resume all medications: as listed on your after visit summary. Your next appointment is:  Future Appointments Date Time Provider [place]  05/07/2014 2:30 PM Gery Pray, MD [facility] None

## 2014-05-07 NOTE — Anesthesia Postprocedure Evaluation (Signed)
Anesthesia Post Note  Patient: Erika [name]  Procedure(s) Performed: Procedure(s) (LRB): TANDEM RING PLACEMENT (N/A)  Anesthesia type: general  Patient location: PACU  Post pain: Pain level controlled  Post assessment: Patient's Cardiovascular Status Stable  Last Vitals:  Filed Vitals:   05/07/14 0830  BP: 124/85  Pulse: 83  Temp: 36.7 C  Resp: 17    Post vital signs: Reviewed and stable  Level of consciousness: sedated  Complications: No apparent anesthesia complications

## 2014-05-07 NOTE — Progress Notes (Signed)
Patient reporting pain at a 7/10 in her vaginal area.  0.5 mg dilaudid IV given per Dr. Sondra Come.

## 2014-05-07 NOTE — Progress Notes (Signed)
Patient in room 1 after CT SIM.  Changed her LR infusion to the IV pump at 50 ml/hr.  Call light in reach and family present in the room.

## 2014-05-07 NOTE — Progress Notes (Signed)
  Radiation Oncology         [phone]) [phone] ________________________________  Name: Erika [name] MRN: [id number]  Date: 05/07/2014  DOB: 02-17-1975  SIMULATION AND TREATMENT PLANNING NOTE HDR BRACHYTHERAPY  DIAGNOSIS:  Stage II-B squamous cell cervical cancer  NARRATIVE:  The patient was brought to the [place] suite.  Identity was confirmed.  All relevant records and images related to the planned course of therapy were reviewed.  The patient freely provided informed written consent to proceed with treatment after reviewing the details related to the planned course of therapy. The consent form was witnessed and verified by the simulation staff.  Then, the patient was set-up in a stable reproducible  supine position for radiation therapy.  the patient's Foley catheter was accessed and contrast placed within the bladder for contouring and imaging purposes CT images were obtained.  Surface markings were placed.  The CT images were loaded into the planning software.  Then the target and avoidance structures were contoured.  Treatment planning then occurred.  The radiation prescription was entered and confirmed.   I have requested : Brachytherapy Isodose Plan and Dosimetry Calculations to plan the radiation distribution.    PLAN:  The patient will receive 5.5 Gy in 1 fraction.    ________________________________  Blair Promise, PhD, MD

## 2014-05-07 NOTE — Progress Notes (Signed)
Patient reports pain at a 5/10.  She was transported to the [place] suite for treatment.

## 2014-05-07 NOTE — Addendum Note (Signed)
Encounter addended by: Jacqulyn Liner, RN on: 05/07/2014  9:55 AM<BR>     Documentation filed: Notes Section

## 2014-05-07 NOTE — Brief Op Note (Signed)
05/07/2014  8:28 AM  PATIENT:  Erika [name]  39 y.o. female  PRE-OPERATIVE DIAGNOSIS:  cervical cancer  POST-OPERATIVE DIAGNOSIS:  cervical cancer  PROCEDURE:  Procedure(s): TANDEM RING PLACEMENT (N/A)  SURGEON:  Surgeon(s) and Role:    * Gery Pray, MD - Primary  PHYSICIAN ASSISTANT:   ASSISTANTS: none   ANESTHESIA:   General, LMA  EBL:  Total I/O In: 500 [I.V.:500] Out: - 100  BLOOD ADMINISTERED:none  DRAINS: Urinary Catheter (Foley)   LOCAL MEDICATIONS USED:  NONE  SPECIMEN:  No Specimen  DISPOSITION OF SPECIMEN:  N/A  COUNTS:  YES  TOURNIQUET:  * No tourniquets in log *  DICTATION: Prior to anesthesia timeout was performed. The patient was prepped and draped in the usual sterile fashion and placed in the dorsal lithotomy position. A Foley catheter was inserted without difficulty. On examination the patient's cervical mass had decreased in size to ~ 2.5 x 3.0 centimeters.No parametrial involvement noted on clinical exam.The patient was noted to have irritation along the distal vaginal vault likely from removal of her ring with her prior 3 high-dose rate treatments.  The patient does have a significantly narrowed vaginal introitus given her petite size. This area of irritation is outside the high-dose radiation field. The cervical sleeve was noted to be in good position within the cervical region. Sutures were noted. Patient then had placement of a 45 60 mm tandem within the uterine cavity and inside the cervical sleeve. The 45 ring was placed along the cervix and affixed to the tandem. A small shielding cap was placed along the ring. Patient then had a rectal paddle placed and affixed to the tandem ring apparatus. There was no room for additional packing along the rectal paddle and rectal area. Palpation along the paddle and ring system showed no rectum extending into the high-dose treatment area. The patient tolerated the procedure well. She was transported to the  recovery room in stable condition.  PLAN OF CARE: Transfer to radiation oncology for planning and treatment  PATIENT DISPOSITION:  PACU - hemodynamically stable.   Delay start of Pharmacological VTE agent (>24hrs) due to surgical blood loss or risk of bleeding: not applicable

## 2014-05-07 NOTE — Progress Notes (Signed)
Received report from [place], [place] in PACU.  Jaquetta has a #20 gauge IV in her left hand with LR infusing to gravity.  She has a foley catheter draining pale yellow urine.  She was transported to CT SIM with Gaspar Garbe Rn.

## 2014-05-08 ENCOUNTER — Encounter (HOSPITAL_COMMUNITY): Payer: Self-pay | Admitting: Radiation Oncology

## 2014-05-09 ENCOUNTER — Telehealth: Payer: Self-pay | Admitting: Oncology

## 2014-05-09 NOTE — Telephone Encounter (Signed)
Called in prescription per Dr. Sondra Come for metroNIDAZOLE (METROGEL) 0.75 % vaginal gel - Place 1 Applicatorful vaginally at bedtime. Use 1 applicator full at bedtime for 5 days. 0 refills.  Per the Pharmacist this will need to be ordered and will be in tomorrow.  Called Erika [name] and let her know.

## 2014-05-13 ENCOUNTER — Telehealth: Payer: Self-pay | Admitting: Oncology

## 2014-05-13 ENCOUNTER — Telehealth: Payer: Self-pay | Admitting: *Deleted

## 2014-05-13 NOTE — Telephone Encounter (Signed)
Joseline called and said [facility] still done not have the metronidazole gel in stock even though they said it had been ordered.  She would like the prescription transferred to [facility] [place].  Called [facility] and called in the prescription for metroNIDAZOLE (METROGEL) 0.75 % vaginal gel Place 1 Applicator full vaginally at bedtime for 5 days. 0 refills.  The pharmacist at [facility] said that they had this in stock.  Called Aamira back and let her know.

## 2014-05-13 NOTE — Telephone Encounter (Signed)
Called Erika [name] back and let her know to use the metronidazole cream every night this week (even Thursday before her tandem treatment) per Dr. Sondra Come.  Erika [name] verbalized agreement and understanding.

## 2014-05-13 NOTE — Telephone Encounter (Signed)
CALLED PATIENT TO INFORM OF OR PROCEDURE BEING Friday 05-17-14 @ [facility], SPOKE WITH PATIENT AND SHE IS AWARE OF THIS PROCEDURE.

## 2014-05-13 NOTE — Telephone Encounter (Signed)
Per Levada Dy, RN in Erika [name], [place] had her labs drawn today for the tandem and ring procedure on Friday.  Kendall though the procedure was going to be on Tuesday. Called Sharyn Lull in [facility] and she said labs drawn today would be fine to use for Friday's procedure.  Romie Jumper, Medical Secretary called [place] and made sure she is aware that the procedure will be on Friday.  Lorel verbalized understanding.

## 2014-05-14 ENCOUNTER — Encounter (HOSPITAL_BASED_OUTPATIENT_CLINIC_OR_DEPARTMENT_OTHER): Payer: Self-pay | Admitting: *Deleted

## 2014-05-14 NOTE — Progress Notes (Signed)
NPO AFTER MN. ARRIVE AT 0600. PT IS GETTING LAB WORK DONE AT [facility] IN [place] , AS PREVIOUS DONE.  CURRENT SERUM hCG IN CHART AND EPIC. MAY TAKE ATIVAN / HYDROCODONE AM DOS W/ SIPS OF WATER. THIS IS PT'S LAST TREATMENT.

## 2014-05-17 ENCOUNTER — Encounter (HOSPITAL_BASED_OUTPATIENT_CLINIC_OR_DEPARTMENT_OTHER)
Admission: RE | Disposition: A | Discharge status: Home / Self Care | Payer: Self-pay | Source: Ambulatory Visit | Attending: Radiation Oncology

## 2014-05-17 ENCOUNTER — Encounter: Payer: Self-pay | Admitting: Radiation Oncology

## 2014-05-17 ENCOUNTER — Ambulatory Visit (HOSPITAL_BASED_OUTPATIENT_CLINIC_OR_DEPARTMENT_OTHER)
Admission: RE | Admit: 2014-05-17 | Discharge: 2014-05-17 | Disposition: A | Discharge status: Home / Self Care | Payer: Medicaid - Out of State | Source: Ambulatory Visit | Attending: Radiation Oncology | Admitting: Radiation Oncology

## 2014-05-17 ENCOUNTER — Ambulatory Visit
Admission: RE | Admit: 2014-05-17 | Discharge: 2014-05-17 | Disposition: A | Discharge status: Home / Self Care | Payer: Medicaid - Out of State | Source: Ambulatory Visit | Attending: Radiation Oncology | Admitting: Radiation Oncology

## 2014-05-17 ENCOUNTER — Encounter (HOSPITAL_BASED_OUTPATIENT_CLINIC_OR_DEPARTMENT_OTHER): Payer: Self-pay | Admitting: *Deleted

## 2014-05-17 ENCOUNTER — Ambulatory Visit (HOSPITAL_BASED_OUTPATIENT_CLINIC_OR_DEPARTMENT_OTHER): Payer: Medicaid - Out of State | Admitting: Anesthesiology

## 2014-05-17 VITALS — BP 115/80 | HR 85 | Temp 98.4°F | Resp 16

## 2014-05-17 DIAGNOSIS — C539 Malignant neoplasm of cervix uteri, unspecified: Secondary | ICD-10-CM | POA: Diagnosis not present

## 2014-05-17 DIAGNOSIS — E876 Hypokalemia: Secondary | ICD-10-CM | POA: Diagnosis not present

## 2014-05-17 DIAGNOSIS — F419 Anxiety disorder, unspecified: Secondary | ICD-10-CM | POA: Insufficient documentation

## 2014-05-17 DIAGNOSIS — D509 Iron deficiency anemia, unspecified: Secondary | ICD-10-CM | POA: Insufficient documentation

## 2014-05-17 DIAGNOSIS — D649 Anemia, unspecified: Secondary | ICD-10-CM | POA: Insufficient documentation

## 2014-05-17 DIAGNOSIS — Z87891 Personal history of nicotine dependence: Secondary | ICD-10-CM | POA: Diagnosis not present

## 2014-05-17 DIAGNOSIS — Z9851 Tubal ligation status: Secondary | ICD-10-CM | POA: Diagnosis not present

## 2014-05-17 DIAGNOSIS — Z510 Encounter for antineoplastic radiation therapy: Secondary | ICD-10-CM | POA: Diagnosis not present

## 2014-05-17 HISTORY — PX: TANDEM RING INSERTION: SHX6199

## 2014-05-17 SURGERY — INSERTION, UTERINE TANDEM AND RING OR CYLINDER, FOR BRACHYTHERAPY
Anesthesia: General | Site: Vagina

## 2014-05-17 MED ORDER — MIDAZOLAM HCL 5 MG/5ML IJ SOLN
INTRAMUSCULAR | Status: DC | PRN
  Administered 2014-05-17: 1 mg via INTRAVENOUS

## 2014-05-17 MED ORDER — LACTATED RINGERS IV SOLN
INTRAVENOUS | Status: DC
  Filled 2014-05-17: qty 250

## 2014-05-17 MED ORDER — MIDAZOLAM HCL 2 MG/2ML IJ SOLN
INTRAMUSCULAR | Status: AC
  Filled 2014-05-17: qty 2

## 2014-05-17 MED ORDER — FENTANYL CITRATE (PF) 100 MCG/2ML IJ SOLN
INTRAMUSCULAR | Status: AC
  Filled 2014-05-17: qty 2

## 2014-05-17 MED ORDER — OXYCODONE HCL 5 MG PO TABS
ORAL_TABLET | ORAL | Status: AC
  Filled 2014-05-17: qty 1

## 2014-05-17 MED ORDER — FENTANYL CITRATE (PF) 100 MCG/2ML IJ SOLN
INTRAMUSCULAR | Status: AC
  Filled 2014-05-17: qty 4

## 2014-05-17 MED ORDER — HYDROMORPHONE HCL 1 MG/ML IJ SOLN
0.5000 mg | Freq: Once | INTRAMUSCULAR | Status: AC
  Administered 2014-05-17: 0.5 mg via INTRAVENOUS
  Filled 2014-05-17: qty 1

## 2014-05-17 MED ORDER — DEXAMETHASONE SODIUM PHOSPHATE 4 MG/ML IJ SOLN
INTRAMUSCULAR | Status: DC | PRN
  Administered 2014-05-17: 10 mg via INTRAVENOUS

## 2014-05-17 MED ORDER — ACETAMINOPHEN 10 MG/ML IV SOLN
INTRAVENOUS | Status: DC | PRN
  Administered 2014-05-17: 1000 mg via INTRAVENOUS

## 2014-05-17 MED ORDER — LACTATED RINGERS IV SOLN
INTRAVENOUS | Status: DC
  Filled 2014-05-17: qty 1000

## 2014-05-17 MED ORDER — WATER FOR IRRIGATION, STERILE IR SOLN: Status: DC | PRN

## 2014-05-17 MED ORDER — ESTRADIOL 0.1 MG/GM VA CREA
TOPICAL_CREAM | VAGINAL | Status: DC | PRN
Start: 2014-05-17 — End: 2014-05-17
  Administered 2014-05-17: 1 via VAGINAL

## 2014-05-17 MED ORDER — LACTATED RINGERS IV SOLN
INTRAVENOUS | Status: DC
  Administered 2014-05-17: 07:00:00 via INTRAVENOUS
  Filled 2014-05-17: qty 1000

## 2014-05-17 MED ORDER — ONDANSETRON HCL 4 MG/2ML IJ SOLN
INTRAMUSCULAR | Status: DC | PRN
  Administered 2014-05-17: 4 mg via INTRAVENOUS

## 2014-05-17 MED ORDER — FENTANYL CITRATE (PF) 100 MCG/2ML IJ SOLN
25.0000 ug | INTRAMUSCULAR | Status: DC | PRN
  Administered 2014-05-17 (×4): 25 ug via INTRAVENOUS
  Filled 2014-05-17: qty 1

## 2014-05-17 MED ORDER — PROPOFOL 10 MG/ML IV BOLUS
INTRAVENOUS | Status: DC | PRN
  Administered 2014-05-17: 100 mg via INTRAVENOUS

## 2014-05-17 MED ORDER — HYDROCODONE-ACETAMINOPHEN 5-325 MG PO TABS: 1.0000 | ORAL_TABLET | Freq: Four times a day (QID) | ORAL | Status: DC | PRN

## 2014-05-17 MED ORDER — OXYCODONE HCL 5 MG PO TABS
5.0000 mg | ORAL_TABLET | Freq: Once | ORAL | Status: AC
  Administered 2014-05-17: 5 mg via ORAL
  Filled 2014-05-17: qty 1

## 2014-05-17 MED ORDER — LIDOCAINE HCL (CARDIAC) 20 MG/ML IV SOLN
INTRAVENOUS | Status: DC | PRN
  Administered 2014-05-17: 50 mg via INTRAVENOUS

## 2014-05-17 MED ORDER — FENTANYL CITRATE (PF) 100 MCG/2ML IJ SOLN
INTRAMUSCULAR | Status: DC | PRN
  Administered 2014-05-17 (×4): 25 ug via INTRAVENOUS

## 2014-05-17 MED ORDER — KETOROLAC TROMETHAMINE 30 MG/ML IJ SOLN
INTRAMUSCULAR | Status: DC | PRN
  Administered 2014-05-17: 30 mg via INTRAVENOUS

## 2014-05-17 SURGICAL SUPPLY — 34 items
BAG URINE DRAINAGE (UROLOGICAL SUPPLIES) ×3 IMPLANT
BNDG CONFORM 2 STRL LF (GAUZE/BANDAGES/DRESSINGS) IMPLANT
CATH FOLEY 2WAY SLVR  5CC 16FR (CATHETERS) ×2
CATH FOLEY 2WAY SLVR 5CC 16FR (CATHETERS) ×1 IMPLANT
CLOTH BEACON ORANGE TIMEOUT ST (SAFETY) ×3 IMPLANT
COVER BACK TABLE 60X90IN (DRAPES) ×3 IMPLANT
DRAPE LG THREE QUARTER DISP (DRAPES) ×3 IMPLANT
DRAPE UNDERBUTTOCKS STRL (DRAPE) ×3 IMPLANT
GLOVE BIO SURGEON STRL SZ 6.5 (GLOVE) ×2 IMPLANT
GLOVE BIO SURGEON STRL SZ7.5 (GLOVE) ×6 IMPLANT
GLOVE BIO SURGEONS STRL SZ 6.5 (GLOVE) ×1
GLOVE INDICATOR 6.5 STRL GRN (GLOVE) ×3 IMPLANT
GOWN STRL REUS W/ TWL LRG LVL3 (GOWN DISPOSABLE) ×2 IMPLANT
GOWN STRL REUS W/TWL LRG LVL3 (GOWN DISPOSABLE) ×4
HOLDER FOLEY CATH W/STRAP (MISCELLANEOUS) ×3 IMPLANT
LEGGING LITHOTOMY PAIR STRL (DRAPES) ×3 IMPLANT
NEEDLE SPNL 22GX3.5 QUINCKE BK (NEEDLE) IMPLANT
PACK BASIN DAY SURGERY FS (CUSTOM PROCEDURE TRAY) ×3 IMPLANT
PACKING VAGINAL (PACKING) IMPLANT
PAD ABD 8X10 STRL (GAUZE/BANDAGES/DRESSINGS) ×3 IMPLANT
PAD OB MATERNITY 4.3X12.25 (PERSONAL CARE ITEMS) ×3 IMPLANT
PAD PREP 24X48 CUFFED NSTRL (MISCELLANEOUS) ×3 IMPLANT
PLUG CATH AND CAP STER (CATHETERS) IMPLANT
SET IRRIG Y TYPE TUR BLADDER L (SET/KITS/TRAYS/PACK) ×3 IMPLANT
SUT PROLENE 0 SH 30 (SUTURE) IMPLANT
SUT SILK 2 0 30  PSL (SUTURE)
SUT SILK 2 0 30 PSL (SUTURE) IMPLANT
SYR BULB IRRIGATION 50ML (SYRINGE) IMPLANT
SYR CONTROL 10ML LL (SYRINGE) IMPLANT
SYRINGE 10CC LL (SYRINGE) ×3 IMPLANT
TOWEL OR 17X24 6PK STRL BLUE (TOWEL DISPOSABLE) ×6 IMPLANT
TRAY DSU PREP LF (CUSTOM PROCEDURE TRAY) ×3 IMPLANT
WATER STERILE IRR 3000ML UROMA (IV SOLUTION) ×3 IMPLANT
WATER STERILE IRR 500ML POUR (IV SOLUTION) ×3 IMPLANT

## 2014-05-17 NOTE — Progress Notes (Signed)
  Radiation Oncology         [phone] ________________________________  Name: Erika [name] MRN: [id number]  Date: 05/17/2014  DOB: 06-22-1975   HDR BRACHYTHERAPY  DIAGNOSIS:  Stage II-B Squamous Cell carcinoma of the Cervix   NARRATIVE: After planning was complete the patient was transferred to the high-dose-rate suite. A fiducial marker was placed within the tandem and ring system.   Verification simulation note  An AP and lateral film was obtained. . This verified accurate position of the tandem and ring system as planned earlier in the day.  High-dose-rate brachytherapy treatment  The remote afterloading device was affixed to the tandem/ring system by catheter. The patient then proceeded to undergo her fifth high-dose-rate treatment directed at the cervical area. The patient was prescribed a dose of 5.5 Gy to be delivered to the high risk CTV. This was achieved with a total treatment time of 274.1 seconds. 2 channels were used to deliver the patient's treatment. The patient tolerated treatment well. after completion of her therapy a radiation survey was performed documenting return of the iridium source into the gamma med safe. Patient was then transferred to the nursing suite. Her Foley catheter was removed. The patient's rectal paddle ring and tandem system was removed without difficulty. Minimal bleeding was noted. The cervical sleeve was also removed without difficulty.  Patient was observed for a period of time and then discharged home.   ________________________________  Blair Promise, PhD, MD

## 2014-05-17 NOTE — Progress Notes (Signed)
  Radiation Oncology         [phone]) [phone] ________________________________  Name: Erika [name] MRN: [id number]  Date: 05/17/2014  DOB: Feb 21, 1975   HDR BRACHYTHERAPY  DIAGNOSIS:  Stage II-B Squamous Cell carcinoma of the Cervix   Simple treatment device note  While in the Operating room the patient had construction of her custom ring/ tandem system. The patient will be treated with a 45 orientation, 60 mm tandem. A 45 orientation ring will also be used for treatment. The small shielding cap was placed over the ring system. The patient's setup also included a rectal paddle to push the rectum out of the high-dose radiation field.   ________________________________  Blair Promise, PhD, MD

## 2014-05-17 NOTE — Transfer of Care (Signed)
Immediate Anesthesia Transfer of Care Note  Patient: Erika [name]  Procedure(s) Performed: Procedure(s): TANDEM RING INSERTION (N/A)  Patient Location: PACU  Anesthesia Type:General  Level of Consciousness: awake and oriented  Airway & Oxygen Therapy: Patient Spontanous Breathing and Patient connected to nasal cannula oxygen  Post-op Assessment: Report given to RN  Post vital signs: Reviewed and stable  Last Vitals:  Filed Vitals:   05/17/14 0638  BP: 99/61  Pulse: 85  Temp: 36.8 C  Resp: 14    Complications: No apparent anesthesia complications

## 2014-05-17 NOTE — Progress Notes (Signed)
Erika [name] states that her pain is at a 4/10 now and is getting better.  Will continue to monitor.

## 2014-05-17 NOTE — Anesthesia Procedure Notes (Signed)
Procedure Name: LMA Insertion Date/Time: 05/17/2014 7:28 AM Performed by: Bethena Roys T Pre-anesthesia Checklist: Patient identified, Emergency Drugs available, Suction available and Patient being monitored Patient Re-evaluated:Patient Re-evaluated prior to inductionOxygen Delivery Method: Circle System Utilized Preoxygenation: Pre-oxygenation with 100% oxygen Intubation Type: IV induction Ventilation: Mask ventilation without difficulty LMA: LMA inserted LMA Size: 3.0 Number of attempts: 1 Airway Equipment and Method: Bite block Placement Confirmation: positive ETCO2 Dental Injury: Teeth and Oropharynx as per pre-operative assessment

## 2014-05-17 NOTE — Progress Notes (Signed)
IMMEDIATELY FOLLOWING SURGERY: Do not drive or operate machinery for the first twenty four hours after surgery. Do not make any important decisions for twenty four hours after surgery or while taking narcotic pain medications or sedatives. If you develop intractable nausea and vomiting or a severe headache please notify your doctor immediately.   FOLLOW-UP: You do not need to follow up with anesthesia unless specifically instructed to do so.   WOUND CARE INSTRUCTIONS (if applicable): Expect some mild vaginal bleeding, but if large amount of bleeding occurs please contact Dr. Kinard at [phone] or the Radiation On-Call physician. Call for any fever greater than 101.0 degrees or increasing vaginal//abdominal pain or trouble urinating.   QUESTIONS?: Please feel free to call your physician or the hospital operator if you have any questions, and they will be happy to assist you. Resume all medications: as listed on your after visit summary. Your next appointment is:  No future appointments.   

## 2014-05-17 NOTE — Anesthesia Postprocedure Evaluation (Signed)
  Anesthesia Post-op Note  Patient: Erika [name]  Procedure(s) Performed: Procedure(s) (LRB): TANDEM RING INSERTION (N/A)  Patient Location: PACU  Anesthesia Type: General  Level of Consciousness: awake and alert   Airway and Oxygen Therapy: Patient Spontanous Breathing  Post-op Pain: mild  Post-op Assessment: Post-op Vital signs reviewed, Patient's Cardiovascular Status Stable, Respiratory Function Stable, Patent Airway and No signs of Nausea or vomiting  Last Vitals:  Filed Vitals:   05/17/14 0900  BP: 119/84  Pulse: 77  Temp:   Resp: 19    Post-op Vital Signs: stable   Complications: No apparent anesthesia complications

## 2014-05-17 NOTE — Discharge Instructions (Signed)

## 2014-05-17 NOTE — Progress Notes (Signed)
Erika [name] is back from CT SIM.  She reports having pain at a 5/10 in her vaginal area but does not want pain medication yet.  She has a foley catheter draining clear, yellow urine.  She has an IV in her left hand infusing LR to gravity.  Changed IV tubing to the IV pump at 125 ml/hr.  Call light in reach.  Family at the bedside.  Will continue to monitor.

## 2014-05-17 NOTE — Brief Op Note (Signed)
05/17/2014  8:40 AM  PATIENT:  Erika [name]  39 y.o. female  PRE-OPERATIVE DIAGNOSIS:  cervical cancer  POST-OPERATIVE DIAGNOSIS:  cervical cancer  PROCEDURE:  Procedure(s): TANDEM RING INSERTION (N/A)  SURGEON:  Surgeon(s) and Role:    * Gery Pray, MD - Primary  PHYSICIAN ASSISTANT:   ASSISTANTS: none   ANESTHESIA:   general, LMA  EBL:  Total I/O In: 200 [I.V.:200] Out: - 50 cc  BLOOD ADMINISTERED:none  DRAINS: Urinary Catheter (Foley)   LOCAL MEDICATIONS USED:  NONE  SPECIMEN:  No Specimen  DISPOSITION OF SPECIMEN:  N/A  COUNTS:  YES  TOURNIQUET:  * No tourniquets in log *  DICTATION: DICTATION: Prior to anesthesia timeout was performed. The patient was prepped and draped in the usual sterile fashion and placed in the dorsal lithotomy position. A Foley catheter was inserted without difficulty. On examination the patient's cervical mass had decreased in size to ~ 2.5 x 3.0 centimeters.No parametrial involvement noted on clinical exam.The patient was noted to have irritation along the distal vaginal vault likely from removal of her ring with her prior 4 high-dose rate treatments. The patient does have a significantly narrowed vaginal introitus given her petite size. This area of irritation is outside the high-dose radiation field. The cervical sleeve was noted to be in good position within the cervical region. Sutures had dissolved.  The patient had esterase cream placed in the vaginal vault for comfort issues and to aid in removal of the tandem ring later in the afternoon.  Patient then had placement of a 45 60 mm tandem within the uterine cavity and inside the cervical sleeve. The 45 ring was placed along the cervix and affixed to the tandem. A small shielding cap was placed along the ring. Patient then had a rectal paddle placed and affixed to the tandem ring apparatus. There was no room for additional packing along the rectal paddle and rectal area. Palpation along  the paddle and ring system showed no rectum extending into the high-dose treatment area. The patient tolerated the procedure well. She was transported to the recovery room in stable condition.  PLAN OF CARE: transfer to radiation oncology for planning and treatment  PATIENT DISPOSITION:  PACU - hemodynamically stable.   Delay start of Pharmacological VTE agent (>24hrs) due to surgical blood loss or risk of bleeding: not applicable

## 2014-05-17 NOTE — Progress Notes (Signed)
Erika [name] is rating her pain at a 5/10 in her vaginal area.  Gave 0.5 mg dilaudid IV per Dr. Sondra Come.  Patient tolerated well.

## 2014-05-17 NOTE — Progress Notes (Signed)
150 cc of yellow urine in foley collection bag.  Removed foley catheter intact.  Assisted Dr. Sondra Come with tandem equipment removal.  Removed left hand IV intact.  Pressure and bandaid applied.  Patient has been given discharge instructions and paperwork.  She will follow up in [place] in 1 month.

## 2014-05-17 NOTE — Progress Notes (Signed)
  Radiation Oncology         [phone] ________________________________  Name: Erika [name] MRN: [id number]  Date: 05/17/2014  DOB: 24-Mar-1975  SIMULATION AND TREATMENT PLANNING NOTE HDR BRACHYTHERAPY  DIAGNOSIS:  Cervical cancer   NARRATIVE:  The patient was brought to the [place] suite.  Identity was confirmed.  All relevant records and images related to the planned course of therapy were reviewed.  The patient freely provided informed written consent to proceed with treatment after reviewing the details related to the planned course of therapy. The consent form was witnessed and verified by the simulation staff.  Then, the patient was set-up in a stable reproducible  supine position for radiation therapy.  CT images were obtained.  Surface markings were placed.  The CT images were loaded into the planning software.  Then the target and avoidance structures were contoured.  Treatment planning then occurred.  The radiation prescription was entered and confirmed.   I have requested : Brachytherapy Isodose Plan and Dosimetry Calculations to plan the radiation distribution.    PLAN:  The patient will receive 5.5 Gy in 1 fraction using iridium 192 as the high-dose-rate source.    ________________________________  Blair Promise, PhD, MD

## 2014-05-17 NOTE — Anesthesia Preprocedure Evaluation (Addendum)
Anesthesia Evaluation  Patient identified by MRN, date of birth, ID band Patient awake    Reviewed: Allergy & Precautions, NPO status , Patient's Chart, lab work & pertinent test results  Airway Mallampati: II  TM Distance: >3 FB Neck ROM: Full    Dental  (+) Chipped, Dental Advisory Given Right upper front tooth is broken in half:   Pulmonary neg pulmonary ROS, former smoker,  breath sounds clear to auscultation  Pulmonary exam normal       Cardiovascular negative cardio ROS  Rhythm:Regular Rate:Normal     Neuro/Psych negative neurological ROS  negative psych ROS   GI/Hepatic negative GI ROS, Neg liver ROS,   Endo/Other  negative endocrine ROS  Renal/GU negative Renal ROS  negative genitourinary   Musculoskeletal negative musculoskeletal ROS (+)   Abdominal   Peds negative pediatric ROS (+)  Hematology  (+) anemia ,   Anesthesia Other Findings Cervical cancer  Reproductive/Obstetrics negative OB ROS                            Anesthesia Physical Anesthesia Plan  ASA: III  Anesthesia Plan: General   Post-op Pain Management:    Induction: Intravenous  Airway Management Planned: LMA  Additional Equipment:   Intra-op Plan:   Post-operative Plan:   Informed Consent:   Plan Discussed with: Surgeon  Anesthesia Plan Comments:         Anesthesia Quick Evaluation

## 2014-05-20 ENCOUNTER — Encounter (HOSPITAL_BASED_OUTPATIENT_CLINIC_OR_DEPARTMENT_OTHER): Payer: Self-pay | Admitting: Radiation Oncology

## 2014-05-27 ENCOUNTER — Telehealth: Payer: Self-pay | Admitting: *Deleted

## 2014-05-27 ENCOUNTER — Telehealth: Payer: Self-pay | Admitting: Oncology

## 2014-05-27 NOTE — Telephone Encounter (Signed)
Pt called with request to follow up with Dr. Denman George per Dr. Sondra Come, Tandem Ring surgery was 4/15 Call placed to Radiation lm on vm for Santiago Glad, RN for clarification if this is a visit to follow up with Dr. Denman George or if something is needed to be removed or checked?

## 2014-05-27 NOTE — Telephone Encounter (Signed)
Called Taria regarding making an appointment with Dr. Denman George.  Per Janae Sauce, she was told by Dr. Sondra Come to make a follow up appointment with Dr. Denman George in 3 weeks and then to alternate visits every 2-3 months.

## 2014-05-28 NOTE — Telephone Encounter (Signed)
Spoke with Santiago Glad, Rn who advised pt does not need anything removed or checked. Pt radiation complete pt needs MD f/u.

## 2014-05-31 NOTE — H&P (Signed)
Radiation Oncology         [phone] ________________________________  History and physical examination  Name: Erika [name] MRN: [id number]  Date: 04/30/14  DOB: 11/01/1975    DIAGNOSIS: Stage IIB cervical cancer  HISTORY OF PRESENT ILLNESS::Erika [name] is a 39 y.o. female who is scheduled to proceed to the operating room for her fifth and final high-dose-rate treatment. The patient is completed external beam and radiosensitizing chemotherapy and 4 intracavitary brachytherapy treatments. She is doing well at this time without any vaginal bleeding.     PAST MEDICAL HISTORY:  has a past medical history of Anxiety; Microcytic hypochromic anemia; Hypokalemia; Cervical cancer; and Family history of adverse reaction to anesthesia.    PAST SURGICAL HISTORY: Past Surgical History  Procedure Laterality Date  . Tubal ligation    . Cesarean section    . Port a cath placement       January 2016 / right   . Tandem ring insertion N/A 04/09/2014    Procedure: TANDEM RING PLACEMENT FOR HIGH DOSE RATE RADIATION THERAPY EXAM UNDER ANETHESIA PLACEMENT OF CERVICAL SLEEVE;  Surgeon: Gery Pray, MD;  Location: [facility];  Service: Urology;  Laterality: N/A;  . Tandem ring insertion N/A 04/23/2014    Procedure: TANDEM RING PLACEMENT ;  Surgeon: Gery Pray, MD;  Location: [facility];  Service: Urology;  Laterality: N/A;  . Tandem ring insertion N/A 04/30/2014    Procedure: TANDEM RING PLACEMENT,EXAM UNDER ANESTHESIA ;  Surgeon: Gery Pray, MD;  [facility];  Service: Urology;  Laterality: N/A;  . Tandem ring insertion N/A 05/07/2014    Procedure: TANDEM RING PLACEMENT;  Surgeon: Gery Pray, MD;  Location[facility];  Service: Urology;  Laterality: N/A;  . Tandem ring insertion N/A 05/17/2014    Procedure: TANDEM RING INSERTION;  Surgeon: Gery Pray, MD;  Location: [facility];  Service: Urology;  Laterality: N/A;    FAMILY HISTORY: family history is not on file.  SOCIAL HISTORY:  reports  that she quit smoking about 2 years ago. Her smoking use included Cigarettes. She has a 3 pack-year smoking history. She has never used smokeless tobacco. She reports that she uses illicit drugs (Marijuana). She reports that she does not drink alcohol.  ALLERGIES: Review of patient's allergies indicates no known allergies.  MEDICATIONS:  No current facility-administered medications for this encounter.   Current Outpatient Prescriptions  Medication Sig Dispense Refill  . ferrous sulfate 325 (65 FE) MG tablet Take 325 mg by mouth daily with breakfast.    . LORazepam (ATIVAN) 0.5 MG tablet Take 0.5 mg by mouth every 6 (six) hours as needed (nausea).     Marland Kitchen MAGNESIUM OXIDE PO Take 1 tablet by mouth daily.    . metroNIDAZOLE (METROGEL) 0.75 % vaginal gel Place 1 Applicatorful vaginally at bedtime. Use 1 applicator full at bedtime for 5 days.  0 refills.  Called in to [facility] per Dr. Sondra Come.    . potassium chloride (K-DUR,KLOR-CON) 10 MEQ tablet Take 20 mEq by mouth 2 (two) times daily.     . promethazine (PHENERGAN) 25 MG tablet Take 25 mg by mouth every 6 (six) hours as needed for nausea or vomiting.    Marland Kitchen HYDROcodone-acetaminophen (NORCO/VICODIN) 5-325 MG per tablet Take 1 tablet by mouth every 6 (six) hours as needed for moderate pain. 20 tablet 0    REVIEW OF SYSTEMS:  A 15 point review of systems is documented in the electronic medical record. This was obtained by the nursing staff.  However, I reviewed this with the patient to discuss relevant findings and make appropriate changes.  Mild fatigue. No urination symptoms bowel complaints or vaginal bleeding   PHYSICAL EXAM:  height is 5\' 1"  (1.549 m) and weight is 101 lb (45.813 kg). Her oral temperature is 97.7 F (36.5 C). Her blood pressure is 119/94 and her pulse is 71. Her respiration is 23 and oxygen saturation is 100%.   The lungs are clear. The heart has a regular rhythm and rate. The abdomen is soft and nontender with normal bowel sounds.  The neurological examination is nonfocal.  LABORATORY DATA:  Lab Results  Component Value Date   WBC 1.5* 04/19/2014   HGB 10.4* 04/19/2014   HCT 34.5* 04/19/2014   MCV 93.2 04/19/2014   PLT 129* 04/19/2014   NEUTROABS 1.1* 04/19/2014   Lab Results  Component Value Date   NA 138 04/19/2014   K 5.2* 04/19/2014   CL 104 04/19/2014   CO2 25 04/19/2014   GLUCOSE 73 04/19/2014   CREATININE 0.62 04/19/2014   CALCIUM 9.5 04/19/2014      RADIOGRAPHY: No results found.    IMPRESSION: Stage IIB cervical cancer  PLAN: The patient is ready to proceed to the operating room for placement of tandem ring for her fifth high-dose rate radiation treatment using iridium 192 as the high-dose-rate source.   ------------------------------------------------  Blair Promise, PhD, MD

## 2014-06-06 NOTE — Progress Notes (Signed)
  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   Pelvic pain and vaginal bleeding resolved, some diarrhea with external beam.  Plan: The patient has completed radiation treatment. The patient will return to radiation oncology clinic for routine followup in one month. I advised them to call or return sooner if they have any questions or concerns related to their recovery or treatment.  -----------------------------------  Blair Promise, PhD, MD

## 2014-06-10 ENCOUNTER — Ambulatory Visit: Payer: Medicaid - Out of State | Attending: Gynecologic Oncology | Admitting: Gynecologic Oncology

## 2014-06-10 ENCOUNTER — Encounter: Payer: Self-pay | Admitting: Gynecologic Oncology

## 2014-06-10 VITALS — BP 110/53 | HR 71 | Temp 98.3°F | Resp 16 | Ht 61.0 in | Wt 100.5 lb

## 2014-06-10 DIAGNOSIS — Z8541 Personal history of malignant neoplasm of cervix uteri: Secondary | ICD-10-CM

## 2014-06-10 DIAGNOSIS — Z87891 Personal history of nicotine dependence: Secondary | ICD-10-CM | POA: Diagnosis not present

## 2014-06-10 DIAGNOSIS — D259 Leiomyoma of uterus, unspecified: Secondary | ICD-10-CM | POA: Diagnosis not present

## 2014-06-10 DIAGNOSIS — F1290 Cannabis use, unspecified, uncomplicated: Secondary | ICD-10-CM | POA: Diagnosis not present

## 2014-06-10 DIAGNOSIS — Z9221 Personal history of antineoplastic chemotherapy: Secondary | ICD-10-CM

## 2014-06-10 DIAGNOSIS — D509 Iron deficiency anemia, unspecified: Secondary | ICD-10-CM | POA: Diagnosis not present

## 2014-06-10 DIAGNOSIS — Z923 Personal history of irradiation: Secondary | ICD-10-CM

## 2014-06-10 DIAGNOSIS — E8940 Asymptomatic postprocedural ovarian failure: Secondary | ICD-10-CM | POA: Diagnosis not present

## 2014-06-10 DIAGNOSIS — C539 Malignant neoplasm of cervix uteri, unspecified: Secondary | ICD-10-CM | POA: Diagnosis present

## 2014-06-10 DIAGNOSIS — E28319 Asymptomatic premature menopause: Secondary | ICD-10-CM | POA: Diagnosis not present

## 2014-06-10 MED ORDER — CALCIUM CARBONATE-VITAMIN D 500-200 MG-UNIT PO TABS: 1.0000 | ORAL_TABLET | Freq: Two times a day (BID) | ORAL | Status: AC

## 2014-06-10 MED ORDER — CONJ ESTROG-MEDROXYPROGEST ACE 0.3-1.5 MG PO TABS: 1.0000 | ORAL_TABLET | Freq: Every day | ORAL | Status: AC

## 2014-06-10 NOTE — Patient Instructions (Signed)
Plan for a PET scan on June 10 for re-evalation 6 weeks from the completion of your treatment.  We will let you know the results of your scan.  Begin taking Calcium with Vitamin D twice daily and Prempro once daily (estrogen with progesterone for two weeks then two weeks off for bone and cardiac health) since your ovaries are not producing hormones since radiation.  You may have light vaginal bleeding when taking the Prempro.  Plan to see Dr. Denman George in three months or sooner if symptoms arise.

## 2014-06-10 NOTE — Progress Notes (Signed)
Followup Gyn Onc Note Consult was initially requested by Dr. Evie Lacks for the evaluation of Erika [name] 38 y.o. female with vaginal bleeding and a cervical mass in January, 2016.  CC:  Chief Complaint  Patient presents with  . Cervical Cancer    Assessment/Plan:  [name]  is a 39 y.o.  year old with stage IIB cervical cancer s/p chemoradiation complete on April 14th, 2016.  She has had a complete clinical response.  PET CT to evaluate for persistent disease  - will schedule for 6 weeks post treatment to optimize treatment effect prior to reimaging.  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  Return to clinic in 3 months. I counseled Erika [name] regarding the symptoms of recurrence and encouraged her to see me sooner if these develop.   HPI: Erika [name] is a 39 year old G2 P2 who was seen in consultation at the request of Dr. Evie Lacks for high-grade cervical dysplasia and a cervical mass. She was seen in the emergency room on 01/30/2014 for vaginal hemorrhage. A transvaginal ultrasound was performed  and revealed the uterus with fibroids measuring in total 11.6 x 9 x 9 cm, and a solid mass noted in the region of the cervix measuring 5.7 x 3.8 x 4 cm. The patient received a blood transfusion for her symptomatic anemia. A biopsy was taken of the cervical mass. Biopsy revealed at least high-grade dysplasia, however the cause of the tangential cart on the biopsy frank invasion could not be either ruled in or out.  Physical examination confirmed parametrial involvement on the right, resulting in a stage IIB diagnosis.  Pretreatment PET/CT on 02/25/14 revealed positive pelvic lymph nodes bilaterally.   Interval History: She receives primary chemoradiation with cisplatin weekly, and I'll pelvic radiation therapy and intracavitary brachytherapy administered at [facility] with Dr. Sondra Come completed in late April, 2016. She  tolerated therapy well. She has no major toxicities. She reports normal bowel habits. She denies vaginal bleeding. She has not yet been sexually active since completing therapy. She is not yet used her vaginal dilator. She is amenorrheic since January 2016.   Current Meds:  Outpatient Encounter Prescriptions as of 06/10/2014  Medication Sig  . ferrous sulfate 325 (65 FE) MG tablet Take 325 mg by mouth daily with breakfast.  . HYDROcodone-acetaminophen (NORCO/VICODIN) 5-325 MG per tablet Take 1 tablet by mouth every 6 (six) hours as needed for moderate pain.  Marland Kitchen LORazepam (ATIVAN) 0.5 MG tablet Take 0.5 mg by mouth every 6 (six) hours as needed (nausea).   Marland Kitchen MAGNESIUM OXIDE PO Take 1 tablet by mouth daily.  . calcium-vitamin D (OSCAL 500/200 D-3) 500-200 MG-UNIT per tablet Take 1 tablet by mouth 2 (two) times daily.  Marland Kitchen estrogen, conjugated,-medroxyprogesterone (PREMPRO) 0.3-1.5 MG per tablet Take 1 tablet by mouth daily.  . metroNIDAZOLE (METROGEL) 0.75 % vaginal gel Place 1 Applicatorful vaginally at bedtime. Use 1 applicator full at bedtime for 5 days.  0 refills.  Called in to [facility] per Dr. Sondra Come.  . potassium chloride (K-DUR,KLOR-CON) 10 MEQ tablet Take 20 mEq by mouth 2 (two) times daily.   . promethazine (PHENERGAN) 25 MG tablet Take 25 mg by mouth every 6 (six) hours as needed for nausea or vomiting.   No facility-administered encounter medications on file as of 06/10/2014.    Allergy: No Known Allergies  Social Hx:   History   Social History  . Marital Status: Single    Spouse  Name: N/A  . Number of Children: 2  . Years of Education: N/A   Occupational History  . cook at [employer] History Main Topics  . Smoking status: Former Smoker -- 0.50 packs/day for 6 years    Types: Cigarettes    Quit date: 08/02/2011  . Smokeless tobacco: Never Used  . Alcohol Use: No  . Drug Use: Yes    Special: Marijuana  . Sexual Activity: Not Currently     Comment: per pt, she hasn't  been sexually active in 4 months due to bleeding   Other Topics Concern  . Not on file   Social History Narrative    Past Surgical Hx:  Past Surgical History  Procedure Laterality Date  . Tubal ligation    . Cesarean section    . Port a cath placement       January 2016 / right   . Tandem ring insertion N/A 04/09/2014    Procedure: TANDEM RING PLACEMENT FOR HIGH DOSE RATE RADIATION THERAPY EXAM UNDER ANETHESIA PLACEMENT OF CERVICAL SLEEVE;  Surgeon: Gery Pray, MD;  Location: [facility];  Service: Urology;  Laterality: N/A;  . Tandem ring insertion N/A 04/23/2014    Procedure: TANDEM RING PLACEMENT ;  Surgeon: Gery Pray, MD;  Location: [facility];  Service: Urology;  Laterality: N/A;  . Tandem ring insertion N/A 04/30/2014    Procedure: TANDEM RING PLACEMENT,EXAM UNDER ANESTHESIA ;  Surgeon: Gery Pray, MD;  Location: [facility];  Service: Urology;  Laterality: N/A;  . Tandem ring insertion N/A 05/07/2014    Procedure: TANDEM RING PLACEMENT;  Surgeon: Gery Pray, MD;  Location[facility];  Service: Urology;  Laterality: N/A;  . Tandem ring insertion N/A 05/17/2014    Procedure: TANDEM RING INSERTION;  Surgeon: Gery Pray, MD;  Location: [facility];  Service: Urology;  Laterality: N/A;    Past Medical Hx:  Past Medical History  Diagnosis Date  . Anxiety   . Microcytic hypochromic anemia   . Hypokalemia   . Cervical cancer   . Family history of adverse reaction to anesthesia     pt states her sister has to have the "reversal" after surgery     Past Gynecological History:   G2 P2 with 1 SV D and one cesarean section. She's had a tubal ligation at the time of cesarean section. Last Pap smear was in 2002. She is no history of abnormal cytology. No LMP recorded. Patient is not currently having periods (Reason: Other).  Family Hx: History reviewed. No pertinent family history.  Review of Systems:  Constitutional  Feels well,    ENT Normal appearing ears and nares  bilaterally Skin/Breast  No rash, sores, jaundice, itching, dryness Cardiovascular  No chest pain, shortness of breath, or edema  Pulmonary  No cough or wheeze.  Gastro Intestinal  No nausea, vomitting, or diarrhoea. No bright red blood per rectum, no abdominal pain, change in bowel movement, or constipation.  Genito Urinary  No frequency, urgency, dysuria, see HPI Musculo Skeletal  No myalgia, arthralgia, joint swelling or pain  Neurologic  No weakness, numbness, change in gait,  Psychology  No depression, anxiety, insomnia.   Vitals:  Blood pressure 110/53, pulse 71, temperature 98.3 F (36.8 C), temperature source Oral, resp. rate 16, height 5\' 1"  (1.549 m), weight 100 lb 8 oz (45.587 kg).  Physical Exam: WD in NAD Neck  Supple NROM, without any enlargements.  Lymph Node Survey No cervical supraclavicular or inguinal adenopathy  Cardiovascular  Pulse normal rate, regularity and rhythm. S1 and S2 normal.  Lungs  Clear to auscultation bilateraly, without wheezes/crackles/rhonchi. Good air movement.  Skin  No rash/lesions/breakdown  Psychiatry  Alert and oriented to person, place, and time  Abdomen  Normoactive bowel sounds, abdomen soft, non-tender and thin without evidence of hernia.  Back No CVA tenderness Genito Urinary  Vulva/vagina: Normal external female genitalia.  No lesions. No discharge or bleeding.  Bladder/urethra:  No lesions or masses, well supported bladder  Vagina: no lesions, radiation changes present  Cervix:  No residual tumor palpable or visible. Slightly raised cervix on left side. It is smooth and regular.  Uterus:  Moderate size, mobile, no parametrial involvement or nodularity.  Adnexa: no palpable masses. Rectal  Good tone, no masses, no parametrial induration. Extremities  No bilateral cyanosis, clubbing or edema.   Erika Eva, MD    CC: Dr Evie Lacks 06/10/2014, 10:19 AM

## 2014-07-08 DIAGNOSIS — D696 Thrombocytopenia, unspecified: Secondary | ICD-10-CM | POA: Insufficient documentation

## 2014-07-08 DIAGNOSIS — D72810 Lymphocytopenia: Secondary | ICD-10-CM | POA: Insufficient documentation

## 2014-07-12 ENCOUNTER — Ambulatory Visit (HOSPITAL_COMMUNITY)
Admission: RE | Admit: 2014-07-12 | Discharge: 2014-07-12 | Disposition: A | Discharge status: Home / Self Care | Payer: Medicaid - Out of State | Source: Ambulatory Visit | Attending: Gynecologic Oncology | Admitting: Gynecologic Oncology

## 2014-07-12 DIAGNOSIS — C539 Malignant neoplasm of cervix uteri, unspecified: Secondary | ICD-10-CM | POA: Insufficient documentation

## 2014-07-12 LAB — GLUCOSE, CAPILLARY: GLUCOSE-CAPILLARY: 80 mg/dL (ref 65–99)

## 2014-07-12 MED ORDER — FLUDEOXYGLUCOSE F - 18 (FDG) INJECTION
4.9300 | Freq: Once | INTRAVENOUS | Status: AC | PRN
  Administered 2014-07-12: 4.93 via INTRAVENOUS

## 2014-07-26 ENCOUNTER — Telehealth: Payer: Self-pay | Admitting: Gynecologic Oncology

## 2014-07-26 NOTE — Telephone Encounter (Signed)
Patient informed of PET scan results.  Per Dr. Denman George: shows complete response with no action needed.  No concerns voiced.  Advised to call for any questions or concerns.

## 2014-09-16 ENCOUNTER — Encounter: Payer: Self-pay | Admitting: Gynecologic Oncology

## 2014-09-16 ENCOUNTER — Ambulatory Visit: Payer: Self-pay | Attending: Gynecologic Oncology | Admitting: Gynecologic Oncology

## 2014-09-16 VITALS — BP 105/66 | HR 90 | Temp 98.2°F | Resp 16 | Ht 61.0 in | Wt 96.9 lb

## 2014-09-16 DIAGNOSIS — C539 Malignant neoplasm of cervix uteri, unspecified: Secondary | ICD-10-CM

## 2014-09-16 DIAGNOSIS — K520 Gastroenteritis and colitis due to radiation: Secondary | ICD-10-CM

## 2014-09-16 NOTE — Patient Instructions (Addendum)
Plan to follow up in Jan 2017 or sooner if needed.  Call for the development of any new symptoms such as vaginal bleeding, cough that is persistent, new swelling in the legs.  Please call after you see Dr. Sondra Come to schedule an appt with Dr. Denman George.

## 2014-09-16 NOTE — Progress Notes (Signed)
Followup Gyn Onc Note Consult was initially requested by Dr. Evie Lacks for the evaluation of Erika [name] 39 y.o. female with vaginal bleeding and a cervical mass in January, 2016.  CC:  Chief Complaint  Patient presents with  . Cervical Cancer    follow-up    Assessment/Plan:  [name]  is a 39 y.o.  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  She has had a complete clinical response and continues to be NED for recurrence.  Radiation colitis - counseled regarding bulking agents and avoiding roughage.  Return to clinic in 3 months to see Dr Sondra Come and in 6 months to see myself. I counseled Erika [name] regarding the symptoms of recurrence and encouraged her to see me sooner if these develop.   HPI: Keimya is a 39 year old G2 P2 who was seen in consultation at the request of Dr. Evie Lacks for high-grade cervical dysplasia and a cervical mass. She was seen in the emergency room on 01/30/2014 for vaginal hemorrhage. A transvaginal ultrasound was performed  and revealed the uterus with fibroids measuring in total 11.6 x 9 x 9 cm, and a solid mass noted in the region of the cervix measuring 5.7 x 3.8 x 4 cm. The patient received a blood transfusion for her symptomatic anemia. A biopsy was taken of the cervical mass. Biopsy revealed at least high-grade dysplasia, however the cause of the tangential cart on the biopsy frank invasion could not be either ruled in or out.  Physical examination confirmed parametrial involvement on the right, resulting in a stage IIB diagnosis.  Pretreatment PET/CT on 02/25/14 revealed positive pelvic lymph nodes bilaterally.  Posttreatment PET/CT on 07/12/14 showed reduction in size and metabolic activity of cervix to 3.2x2.6cm and no hypermetabolic activity in lymph nodes. Confirmed complete response to therapy.  She receives primary chemoradiation with cisplatin weekly, and I'll pelvic radiation therapy and  intracavitary brachytherapy administered at [facility] with Dr. Sondra Come completed in late April, 2016.    Interval History: She reports loose stools. She denies vaginal bleeding. She has been sexually active since completing therapy and uses lubrication with no issues.    Current Meds:  Outpatient Encounter Prescriptions as of 09/16/2014  Medication Sig  . calcium-vitamin D (OSCAL 500/200 D-3) 500-200 MG-UNIT per tablet Take 1 tablet by mouth 2 (two) times daily.  Marland Kitchen estrogen, conjugated,-medroxyprogesterone (PREMPRO) 0.3-1.5 MG per tablet Take 1 tablet by mouth daily.  . ferrous sulfate 325 (65 FE) MG tablet Take 325 mg by mouth daily with breakfast.  . LORazepam (ATIVAN) 0.5 MG tablet Take 0.5 mg by mouth every 6 (six) hours as needed (nausea).   Marland Kitchen MAGNESIUM OXIDE PO Take 1 tablet by mouth daily.  . promethazine (PHENERGAN) 25 MG tablet Take 25 mg by mouth every 6 (six) hours as needed for nausea or vomiting.  . [DISCONTINUED] HYDROcodone-acetaminophen (NORCO/VICODIN) 5-325 MG per tablet Take 1 tablet by mouth every 6 (six) hours as needed for moderate pain.  . [DISCONTINUED] metroNIDAZOLE (METROGEL) 0.75 % vaginal gel Place 1 Applicatorful vaginally at bedtime. Use 1 applicator full at bedtime for 5 days.  0 refills.  Called in to [facility] per Dr. Sondra Come.  . [DISCONTINUED] potassium chloride (K-DUR,KLOR-CON) 10 MEQ tablet Take 20 mEq by mouth 2 (two) times daily.    No facility-administered encounter medications on file as of 09/16/2014.    Allergy: No Known Allergies  Social Hx:   Social History  Social History  . Marital Status: Single    Spouse Name: N/A  . Number of Children: 2  . 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 Smoking status: Former Smoker -- 0.50 packs/day for 6 years    Types: Cigarettes    Quit date: 08/02/2011  . Smokeless tobacco: Never Used  . Alcohol Use: No  . Drug Use: Yes    Special:  Marijuana  . Sexual Activity: Yes   Other Topics Concern  . Not on file   Social History Narrative    Past Surgical Hx:  Past Surgical History  Procedure Laterality Date  . Tubal ligation    . Cesarean section    . Port a cath placement       January 2016 / right   . Tandem ring insertion N/A 04/09/2014    Procedure: TANDEM RING PLACEMENT FOR HIGH DOSE RATE RADIATION THERAPY EXAM UNDER ANETHESIA PLACEMENT OF CERVICAL SLEEVE;  Surgeon: Gery Pray, MD;  Location: [facility];  Service: Urology;  Laterality: N/A;  . Tandem ring insertion N/A 04/23/2014    Procedure: TANDEM RING PLACEMENT ;  Surgeon: Gery Pray, MD;  Location: [facility];  Service: Urology;  Laterality: N/A;  . Tandem ring insertion N/A 04/30/2014    Procedure: TANDEM RING PLACEMENT,EXAM UNDER ANESTHESIA ;  Surgeon: Gery Pray, MD;  Location: [facility];  Service: Urology;  Laterality: N/A;  . Tandem ring insertion N/A 05/07/2014    Procedure: TANDEM RING PLACEMENT;  Surgeon: Gery Pray, MD;  Location: [facility];  Service: Urology;  Laterality: N/A;  . Tandem ring insertion N/A 05/17/2014    Procedure: TANDEM RING INSERTION;  Surgeon: Gery Pray, MD;  Location: [facility];  Service: Urology;  Laterality: N/A;    Past Medical Hx:  Past Medical History  Diagnosis Date  . Anxiety   . Microcytic hypochromic anemia   . Hypokalemia   . Cervical cancer   . Family history of adverse reaction to anesthesia     pt states her sister has to have the "reversal" after surgery     Past Gynecological History:   G2 P2 with 1 SV D and one cesarean section. She's had a tubal ligation at the time of cesarean section. Last Pap smear was in 2002. She is no history of abnormal cytology. No LMP recorded. Patient is not currently having periods (Reason: Other).  Family Hx: History reviewed. No pertinent family history.  Review of Systems:  Constitutional  Feels well,    ENT Normal appearing ears and nares  bilaterally Skin/Breast  No rash, sores, jaundice, itching, dryness Cardiovascular  No chest pain, shortness of breath, or edema  Pulmonary  No cough or wheeze.  Gastro Intestinal  No nausea, vomitting, or diarrhoea. No bright red blood per rectum, no abdominal pain, change in bowel movement, or constipation.  Genito Urinary  No frequency, urgency, dysuria, see HPI Musculo Skeletal  No myalgia, arthralgia, joint swelling or pain  Neurologic  No weakness, numbness, change in gait,  Psychology  No depression, anxiety, insomnia.   Vitals:  Blood pressure 105/66, pulse 90, temperature 98.2 F (36.8 C), temperature source Oral, resp. rate 16, height 5\' 1"  (1.549 m), weight 96 lb 14.4 oz (43.954 kg), SpO2 100 %.  Physical Exam: WD in NAD Neck  Supple NROM, without Erika enlargements.  Lymph Node Survey No cervical supraclavicular or inguinal adenopathy Cardiovascular  Pulse normal rate,  regularity and rhythm. S1 and S2 normal.  Lungs  Clear to auscultation bilateraly, without wheezes/crackles/rhonchi. Good air movement.  Skin  No rash/lesions/breakdown  Psychiatry  Alert and oriented to person, place, and time  Abdomen  Normoactive bowel sounds, abdomen soft, non-tender and thin without evidence of hernia.  Back No CVA tenderness Genito Urinary  Vulva/vagina: Normal external female genitalia.  No lesions. No discharge or bleeding.  Bladder/urethra:  No lesions or masses, well supported bladder  Vagina: no lesions, radiation changes present  Cervix:  No residual tumor palpable or visible. Slightly raised cervix on left side. It is smooth and regular.  Uterus:  Moderate size, mobile, no parametrial involvement or nodularity.  Adnexa: no palpable masses. Rectal  Good tone, no masses, no parametrial induration. Extremities  No bilateral cyanosis, clubbing or edema.   Donaciano Eva, MD    CC: Dr Evie Lacks 09/16/2014, 11:01 AM

## 2014-11-12 DIAGNOSIS — F5105 Insomnia due to other mental disorder: Secondary | ICD-10-CM

## 2014-11-12 DIAGNOSIS — F418 Other specified anxiety disorders: Secondary | ICD-10-CM | POA: Insufficient documentation

## 2014-11-12 DIAGNOSIS — D72819 Decreased white blood cell count, unspecified: Secondary | ICD-10-CM | POA: Insufficient documentation

## 2014-11-12 DIAGNOSIS — F419 Anxiety disorder, unspecified: Secondary | ICD-10-CM

## 2014-11-12 DIAGNOSIS — F329 Major depressive disorder, single episode, unspecified: Secondary | ICD-10-CM | POA: Insufficient documentation

## 2015-03-10 ENCOUNTER — Telehealth: Payer: Self-pay | Admitting: *Deleted

## 2015-03-10 ENCOUNTER — Encounter: Payer: Self-pay | Admitting: Gynecologic Oncology

## 2015-03-10 NOTE — Telephone Encounter (Signed)
Notified pt of  Appointment scheduled on feb 20th with Dr. Denman George. Pt agreed with time and date

## 2015-03-18 ENCOUNTER — Telehealth: Payer: Self-pay | Admitting: Oncology

## 2015-03-18 NOTE — Telephone Encounter (Signed)
Received a message from [facility] regarding a recent prescription for Ativan.  Patient is seen in [place].  Called [place], RN in [place] and relayed the message from [place] will call them to clarify the prescription.

## 2015-03-24 ENCOUNTER — Ambulatory Visit: Payer: Self-pay | Admitting: Gynecologic Oncology

## 2015-04-01 ENCOUNTER — Encounter: Payer: Self-pay | Admitting: Gynecologic Oncology

## 2015-04-01 NOTE — Progress Notes (Signed)
Returned pt's phone call regarding questions about being uninsured and what to do about appointment 04/02/15. Introduced myself as Estate manager/land agent and advised patient that she can be billed for service on 04/02/15 and would automatically receive a 55% discount for being uninsured.  Patient states when she re-certified for Medicaid after returning to work, she was over-income. I reassured patient she can be billed and does not need to re-schedule her appointment.  I also put a note in for her to see me on 04/02/15 so that I may give her a FA application and my card for any additional questions or concerns. Patient verbalized understanding and was appreciative.

## 2015-04-02 ENCOUNTER — Ambulatory Visit: Payer: Medicaid - Out of State | Attending: Gynecologic Oncology | Admitting: Gynecologic Oncology

## 2015-04-02 ENCOUNTER — Encounter: Payer: Self-pay | Admitting: Gynecologic Oncology

## 2015-04-02 VITALS — BP 106/59 | HR 73 | Temp 98.4°F | Ht 61.0 in | Wt 98.7 lb

## 2015-04-02 DIAGNOSIS — Z923 Personal history of irradiation: Secondary | ICD-10-CM | POA: Insufficient documentation

## 2015-04-02 DIAGNOSIS — E876 Hypokalemia: Secondary | ICD-10-CM | POA: Diagnosis not present

## 2015-04-02 DIAGNOSIS — C539 Malignant neoplasm of cervix uteri, unspecified: Secondary | ICD-10-CM | POA: Diagnosis not present

## 2015-04-02 DIAGNOSIS — Z9221 Personal history of antineoplastic chemotherapy: Secondary | ICD-10-CM | POA: Diagnosis not present

## 2015-04-02 DIAGNOSIS — F419 Anxiety disorder, unspecified: Secondary | ICD-10-CM | POA: Insufficient documentation

## 2015-04-02 DIAGNOSIS — Z8541 Personal history of malignant neoplasm of cervix uteri: Secondary | ICD-10-CM

## 2015-04-02 DIAGNOSIS — Z87891 Personal history of nicotine dependence: Secondary | ICD-10-CM | POA: Diagnosis not present

## 2015-04-02 DIAGNOSIS — R634 Abnormal weight loss: Secondary | ICD-10-CM

## 2015-04-02 DIAGNOSIS — D509 Iron deficiency anemia, unspecified: Secondary | ICD-10-CM | POA: Diagnosis not present

## 2015-04-02 NOTE — Patient Instructions (Signed)
Plan to follow up with Dr. Sondra Come in [place] in three months and Dr. Denman George in August or sooner if needed.  If you will please call after you see Dr. Sondra Come, we can schedule you an appt with Dr. Denman George [phone])

## 2015-04-02 NOTE — Progress Notes (Signed)
Followup Gyn Onc Note Consult was initially requested by Dr. Evie Lacks for the evaluation of Erika [name] 40 y.o. female with vaginal bleeding and a cervical mass in January, 2016.  CC:  Chief Complaint  Patient presents with  . Cervical Cancer    MD follow up    Assessment/Plan:  Erika [name]  is a 40 y.o.  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  She has had a complete clinical response and continues to be NED for recurrence.  Weight loss - possibly related to radiation changes to the gut decreasing absorption. Recommended taking protein supplemental drink dail.  Return to clinic in 3 months to see Dr Sondra Come and in 6 months to see myself. I counseled Erika [name] regarding the symptoms of recurrence and encouraged her to see me sooner if these develop.   HPI: Erika [name] is a 40 year old G2 P2 who was seen in consultation at the request of Dr. Evie Lacks for high-grade cervical dysplasia and a cervical mass. She was seen in the emergency room on 01/30/2014 for vaginal hemorrhage. A transvaginal ultrasound was performed  and revealed the uterus with fibroids measuring in total 11.6 x 9 x 9 cm, and a solid mass noted in the region of the cervix measuring 5.7 x 3.8 x 4 cm. The patient received a blood transfusion for her symptomatic anemia. A biopsy was taken of the cervical mass. Biopsy revealed at least high-grade dysplasia, however the cause of the tangential cart on the biopsy frank invasion could not be either ruled in or out.  Physical examination confirmed parametrial involvement on the right, resulting in a stage IIB diagnosis.  Pretreatment PET/CT on 02/25/14 revealed positive pelvic lymph nodes bilaterally.  Posttreatment PET/CT on 07/12/14 showed reduction in size and metabolic activity of cervix to 3.2x2.6cm and no hypermetabolic activity in lymph nodes. Confirmed complete response to therapy.  She receives primary  chemoradiation with cisplatin weekly, and I'll pelvic radiation therapy and intracavitary brachytherapy administered at [facility] with Dr. Sondra Come completed in late April, 2016.    Interval History: She reports resolution of loose stools. She denies vaginal bleeding. She has been sexually active since completing therapy and uses lubrication with no issues. She has had difficulty regaining weight to her baseline (100lbs).   Current Meds:  Outpatient Encounter Prescriptions as of 04/02/2015  Medication Sig  . calcium-vitamin D (OSCAL 500/200 D-3) 500-200 MG-UNIT per tablet Take 1 tablet by mouth 2 (two) times daily.  Marland Kitchen estrogen, conjugated,-medroxyprogesterone (PREMPRO) 0.3-1.5 MG per tablet Take 1 tablet by mouth daily.  . ferrous sulfate 325 (65 FE) MG tablet Take 325 mg by mouth daily with breakfast.  . LORazepam (ATIVAN) 0.5 MG tablet Take 1 mg by mouth every 6 (six) hours as needed (nausea).   Marland Kitchen MAGNESIUM OXIDE PO Take 1 tablet by mouth once a week.   . [DISCONTINUED] promethazine (PHENERGAN) 25 MG tablet Take 25 mg by mouth every 6 (six) hours as needed for nausea or vomiting.   No facility-administered encounter medications on file as of 04/02/2015.    Allergy: No Known Allergies  Social Hx:   Social History   Social History  . Marital Status: Single    Spouse Name: N/A  . Number of Children: 2  . 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 Smoking status: Former Smoker -- 0.50 packs/day for  6 years    Types: Cigarettes    Quit date: 08/02/2011  . Smokeless tobacco: Never Used  . Alcohol Use: No  . Drug Use: Yes    Special: Marijuana  . Sexual Activity: Yes   Other Topics Concern  . Not on file   Social History Narrative    Past Surgical Hx:  Past Surgical History  Procedure Laterality Date  . Tubal ligation    . Cesarean section    . Port a cath placement       January 2016 / right   . Tandem  ring insertion N/A 04/09/2014    Procedure: TANDEM RING PLACEMENT FOR HIGH DOSE RATE RADIATION THERAPY EXAM UNDER ANETHESIA PLACEMENT OF CERVICAL SLEEVE;  Surgeon: Gery Pray, MD;  Location: [facility];  Service: Urology;  Laterality: N/A;  . Tandem ring insertion N/A 04/23/2014    Procedure: TANDEM RING PLACEMENT ;  Surgeon: Gery Pray, MD;  Location: [facility];  Service: Urology;  Laterality: N/A;  . Tandem ring insertion N/A 04/30/2014    Procedure: TANDEM RING PLACEMENT,EXAM UNDER ANESTHESIA ;  Surgeon: Gery Pray, MD;  Location: [facility];  Service: Urology;  Laterality: N/A;  . Tandem ring insertion N/A 05/07/2014    Procedure: TANDEM RING PLACEMENT;  Surgeon: Gery Pray, MD;  Location: [facility];  Service: Urology;  Laterality: N/A;  . Tandem ring insertion N/A 05/17/2014    Procedure: TANDEM RING INSERTION;  Surgeon: Gery Pray, MD;  Location: [facility];  Service: Urology;  Laterality: N/A;    Past Medical Hx:  Past Medical History  Diagnosis Date  . Anxiety   . Microcytic hypochromic anemia   . Hypokalemia   . Cervical cancer ([place])   . Family history of adverse reaction to anesthesia     pt states her sister has to have the "reversal" after surgery     Past Gynecological History:   G2 P2 with 1 SV D and one cesarean section. She's had a tubal ligation at the time of cesarean section. Last Pap smear was in 2002. She is no history of abnormal cytology. No LMP recorded. Patient is not currently having periods (Reason: Other).  Family Hx: History reviewed. No pertinent family history.  Review of Systems:  Constitutional  Feels well,    ENT Normal appearing ears and nares bilaterally Skin/Breast  No rash, sores, jaundice, itching, dryness Cardiovascular  No chest pain, shortness of breath, or edema  Pulmonary  No cough or wheeze.  Gastro Intestinal  No nausea, vomitting, or diarrhoea. No bright red blood per rectum, no abdominal pain, change in bowel movement, or  constipation.  Genito Urinary  No frequency, urgency, dysuria, see HPI Musculo Skeletal  No myalgia, arthralgia, joint swelling or pain  Neurologic  No weakness, numbness, change in gait,  Psychology  No depression, anxiety, insomnia.   Vitals:  Blood pressure 106/59, pulse 73, temperature 98.4 F (36.9 C), temperature source Oral, height 5\' 1"  (1.549 m), weight 98 lb 11.2 oz (44.77 kg), SpO2 92 %.  Physical Exam: WD in NAD Neck  Supple NROM, without any enlargements.  Lymph Node Survey No cervical supraclavicular or inguinal adenopathy Cardiovascular  Pulse normal rate, regularity and rhythm. S1 and S2 normal.  Lungs  Clear to auscultation bilateraly, without wheezes/crackles/rhonchi. Good air movement.  Skin  No rash/lesions/breakdown  Psychiatry  Alert and oriented to person, place, and time  Abdomen  Normoactive bowel sounds, abdomen soft, non-tender and thin without evidence of hernia.  Back No  CVA tenderness Genito Urinary  Vulva/vagina: Normal external female genitalia.  No lesions. No discharge or bleeding.  Bladder/urethra:  No lesions or masses, well supported bladder  Vagina: no lesions, radiation changes present  Cervix:  No residual tumor palpable or visible. Slightly raised cervix on left side. It is smooth and regular.  Uterus:  Moderate size, mobile, no parametrial involvement or nodularity.  Adnexa: no palpable masses. Rectal  Good tone, no masses, no parametrial induration. Extremities  No bilateral cyanosis, clubbing or edema.   Donaciano Eva, MD    CC: Dr Evie Lacks 04/02/2015, 2:33 PM

## 2015-11-17 ENCOUNTER — Other Ambulatory Visit: Payer: Self-pay | Admitting: Radiation Oncology

## 2015-11-17 DIAGNOSIS — C539 Malignant neoplasm of cervix uteri, unspecified: Secondary | ICD-10-CM

## 2015-11-18 ENCOUNTER — Other Ambulatory Visit: Payer: Self-pay | Admitting: Radiation Oncology

## 2015-11-18 DIAGNOSIS — C539 Malignant neoplasm of cervix uteri, unspecified: Secondary | ICD-10-CM

## 2015-11-24 ENCOUNTER — Telehealth: Payer: Self-pay | Admitting: *Deleted

## 2015-11-24 ENCOUNTER — Other Ambulatory Visit: Payer: Self-pay | Admitting: Gynecologic Oncology

## 2015-11-24 ENCOUNTER — Encounter: Payer: Self-pay | Admitting: Gynecologic Oncology

## 2015-11-24 ENCOUNTER — Telehealth: Payer: Self-pay | Admitting: Gynecologic Oncology

## 2015-11-24 DIAGNOSIS — C539 Malignant neoplasm of cervix uteri, unspecified: Secondary | ICD-10-CM

## 2015-11-24 DIAGNOSIS — R160 Hepatomegaly, not elsewhere classified: Secondary | ICD-10-CM

## 2015-11-24 NOTE — Progress Notes (Signed)
Gynecologic Oncology Multi-Disciplinary Disposition Conference Note  Date of the Conference: November 24, 2015  Patient Name: Erika [name]  Referring Provider: Dr. Evie Lacks Primary GYN Oncologist: Dr. Everitt Amber  Stage/Disposition:  Stage IIB Cervical Cancer with possible recurrence.  Disposition is to CT or US biopsy to evaluate liver mass.  PET scan to evaluate for extent of metastatic disease.  If biopsy confirmed recurrence, palliative intent chemotherapy.   This Multidisciplinary conference took place involving physicians from [place], [place], Radiation Oncology, Pathology, Radiology along with the Gynecologic Oncology Nurse Practitioner and RN.  Comprehensive assessment of the patient's malignancy, staging, need for surgery, chemotherapy, radiation therapy, and need for further testing were reviewed. Supportive measures, both inpatient and following discharge were also discussed. The recommended plan of care is documented. Greater than 35 minutes were spent correlating and coordinating this patient's care.

## 2015-11-24 NOTE — Telephone Encounter (Signed)
Notified patient of Radiology appointment. Pt is scheduled to have a PET scan on 12/02/2015 @ 11:00. Pt was advised to arrive 56min early to register and to have nothing to eat or drink after Midnight . Pt voiced understanding

## 2015-11-24 NOTE — Telephone Encounter (Signed)
Informed her of discussion from [place] this morning.  Recommend that she have biopsy of hepatic mass to confirm recurrence (and cell type). PET/CT is ordered to evaluate for extent of disease.  If recurrence is confirmed, recommend chemotherapy salvage with cis or carbo/taxol/bevacizumab  Donaciano Eva, MD

## 2015-11-25 ENCOUNTER — Other Ambulatory Visit: Payer: Self-pay | Admitting: Radiology

## 2015-11-26 ENCOUNTER — Ambulatory Visit (HOSPITAL_COMMUNITY)
Admission: RE | Admit: 2015-11-26 | Discharge: 2015-11-26 | Disposition: A | Discharge status: Home / Self Care | Payer: Medicaid - Out of State | Source: Ambulatory Visit | Attending: Radiation Oncology | Admitting: Radiation Oncology

## 2015-11-26 ENCOUNTER — Encounter (HOSPITAL_COMMUNITY): Payer: Self-pay

## 2015-11-26 ENCOUNTER — Other Ambulatory Visit: Payer: Self-pay | Admitting: Radiation Oncology

## 2015-11-26 DIAGNOSIS — R1011 Right upper quadrant pain: Secondary | ICD-10-CM | POA: Insufficient documentation

## 2015-11-26 DIAGNOSIS — F419 Anxiety disorder, unspecified: Secondary | ICD-10-CM | POA: Insufficient documentation

## 2015-11-26 DIAGNOSIS — K769 Liver disease, unspecified: Secondary | ICD-10-CM | POA: Diagnosis present

## 2015-11-26 DIAGNOSIS — Z923 Personal history of irradiation: Secondary | ICD-10-CM | POA: Insufficient documentation

## 2015-11-26 DIAGNOSIS — Z8541 Personal history of malignant neoplasm of cervix uteri: Secondary | ICD-10-CM | POA: Insufficient documentation

## 2015-11-26 DIAGNOSIS — Z87891 Personal history of nicotine dependence: Secondary | ICD-10-CM | POA: Insufficient documentation

## 2015-11-26 DIAGNOSIS — C539 Malignant neoplasm of cervix uteri, unspecified: Secondary | ICD-10-CM

## 2015-11-26 LAB — PROTIME-INR
INR: 0.94
PROTHROMBIN TIME: 12.6 s (ref 11.4–15.2)

## 2015-11-26 LAB — CBC
HCT: 36.8 % (ref 36.0–46.0)
Hemoglobin: 11.6 g/dL — ABNORMAL LOW (ref 12.0–15.0)
MCH: 28.6 pg (ref 26.0–34.0)
MCHC: 31.5 g/dL (ref 30.0–36.0)
MCV: 90.9 fL (ref 78.0–100.0)
PLATELETS: 270 10*3/uL (ref 150–400)
RBC: 4.05 MIL/uL (ref 3.87–5.11)
RDW: 14.5 % (ref 11.5–15.5)
WBC: 6.2 10*3/uL (ref 4.0–10.5)

## 2015-11-26 LAB — APTT: APTT: 29 s (ref 24–36)

## 2015-11-26 MED ORDER — MIDAZOLAM HCL 2 MG/2ML IJ SOLN
INTRAMUSCULAR | Status: AC
  Filled 2015-11-26: qty 6

## 2015-11-26 MED ORDER — SODIUM CHLORIDE 0.9 % IV SOLN
INTRAVENOUS | Status: DC
Start: 2015-11-26 — End: 2015-11-27

## 2015-11-26 MED ORDER — PIPERACILLIN-TAZOBACTAM 3.375 G IVPB
3.3750 g | Freq: Once | INTRAVENOUS | Status: DC
  Filled 2015-11-26: qty 50

## 2015-11-26 MED ORDER — HYDROCODONE-ACETAMINOPHEN 5-325 MG PO TABS
1.0000 | ORAL_TABLET | ORAL | Status: DC | PRN
  Filled 2015-11-26: qty 2

## 2015-11-26 MED ORDER — FENTANYL CITRATE (PF) 100 MCG/2ML IJ SOLN
INTRAMUSCULAR | Status: AC | PRN
  Administered 2015-11-26 (×2): 25 ug via INTRAVENOUS

## 2015-11-26 MED ORDER — FENTANYL CITRATE (PF) 100 MCG/2ML IJ SOLN
INTRAMUSCULAR | Status: AC
  Filled 2015-11-26: qty 4

## 2015-11-26 MED ORDER — PIPERACILLIN-TAZOBACTAM 3.375 G IVPB 30 MIN
3.3750 g | Freq: Once | INTRAVENOUS | Status: AC
  Administered 2015-11-26: 3.375 g via INTRAVENOUS
  Filled 2015-11-26: qty 50

## 2015-11-26 MED ORDER — MIDAZOLAM HCL 2 MG/2ML IJ SOLN
INTRAMUSCULAR | Status: AC | PRN
  Administered 2015-11-26 (×3): 1 mg via INTRAVENOUS

## 2015-11-26 NOTE — Progress Notes (Signed)
Patient ID: Erika [name], female   DOB: 1975/02/17, 40 y.o.   MRN: [id number] GYN oncology service notified of findings of liver aspirate today yielding purulent looking fluid. Cultures/cytology pending. Patient will be given 1 dose of IV Zosyn today. Their service will follow-up with lab results and notify patient with any acute changes.She is currently afebrile with nl WBC.

## 2015-11-26 NOTE — Consult Note (Signed)
Chief Complaint: Patient was seen in consultation today for image guided liver lesion biopsy  Referring Physician(s): Kinard,James/ Rossi,E  Supervising Physician: Markus Daft  Patient Status: [facility] - Out-pt  History of Present Illness: Erika [name] is a 40 y.o. female with past medical history significant for stage II cervical cancer diagnosed  in 2016, 1 year s/p brachytherapy.  Presents today for image guided biopsy of right hepatic lobe lesion found on MRA 10/4 at [place] to 2 month history of RUQ pain radiating to back, weight loss, and night sweats.  Denies recent illness, fever, chest pain, shortness of breath, abdominal pain, abnormal bleeding and headache.  Past Medical History:  Diagnosis Date  . Anxiety   . Cervical cancer ([place])   . Family history of adverse reaction to anesthesia    pt states her sister has to have the "reversal" after surgery   . Hypokalemia   . Microcytic hypochromic anemia     Past Surgical History:  Procedure Laterality Date  . CESAREAN SECTION    . port a cath placement      January 2016 / right   . TANDEM RING INSERTION N/A 04/09/2014   Procedure: TANDEM RING PLACEMENT FOR HIGH DOSE RATE RADIATION THERAPY EXAM UNDER ANETHESIA PLACEMENT OF CERVICAL SLEEVE;  Surgeon: Gery Pray, MD;  [facility];  Service: Urology;  Laterality: N/A;  . TANDEM RING INSERTION N/A 04/23/2014   Procedure: TANDEM RING PLACEMENT ;  Surgeon: Gery Pray, MD;  Location: [facility];  Service: Urology;  Laterality: N/A;  . TANDEM RING INSERTION N/A 04/30/2014   Procedure: TANDEM RING PLACEMENT,EXAM UNDER ANESTHESIA ;  Surgeon: Gery Pray, MD;  Location: [facility];  Service: Urology;  Laterality: N/A;  . TANDEM RING INSERTION N/A 05/07/2014   Procedure: TANDEM RING PLACEMENT;  Surgeon: Gery Pray, MD;  [facility];  Service: Urology;  Laterality: N/A;  . TANDEM RING INSERTION N/A 05/17/2014   Procedure: TANDEM RING INSERTION;  Surgeon: Gery Pray, MD;  Location[facility];  Service: Urology;  Laterality: N/A;  . TUBAL LIGATION      Allergies: Review of patient's allergies indicates no known allergies.  Medications: Prior to Admission medications   Medication Sig Start Date End Date Taking? Authorizing Provider  calcium-vitamin D (OSCAL 500/200 D-3) 500-200 MG-UNIT per tablet Take 1 tablet by mouth 2 (two) times daily. 06/10/14  Yes Everitt Amber, MD  estrogen, conjugated,-medroxyprogesterone (PREMPRO) 0.3-1.5 MG per tablet Take 1 tablet by mouth daily. 06/10/14  Yes Everitt Amber, MD  HYDROcodone-acetaminophen (NORCO/VICODIN) 5-325 MG tablet Take 1 tablet by mouth every 6 (six) hours as needed for moderate pain.   Yes Historical Provider, MD  LORazepam (ATIVAN) 0.5 MG tablet Take 1 mg by mouth every 6 (six) hours as needed (nausea).    Yes Historical Provider, MD  ondansetron (ZOFRAN) 4 MG tablet Take 4 mg by mouth every 8 (eight) hours as needed for nausea or vomiting.   Yes Historical Provider, MD  ferrous sulfate 325 (65 FE) MG tablet Take 325 mg by mouth daily with breakfast.    Historical Provider, MD  MAGNESIUM OXIDE PO Take 1 tablet by mouth once a week.     Historical Provider, MD     History reviewed. No pertinent family history.  Social History   Social History  . Marital status: Single    Spouse name: N/A  . Number of children: 2  . 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 Smoking status: Former Smoker    Packs/day: 0.50    Years: 6.00    Types: Cigarettes    Quit date: 08/02/2011  . Smokeless tobacco: Never Used  . Alcohol use No  . Drug use:     Types: Marijuana  . Sexual activity: Yes   Other Topics Concern  . None   Social History Narrative  . None    Review of Systems see above  Vital Signs: BP 106/64 (BP Location: Right Arm)   Pulse 86   Temp 98.2 F (36.8 C) (Oral)   Resp 16   Wt 91 lb 6.4 oz (41.5 kg)   SpO2  100%   BMI 17.27 kg/m   Physical Exam 40 yo thin female, afebrile, resting comfortably in bed, in no acute distress, AAOx3  Resp: clear to auscultation bilaterally, normal respiratory effort, no rales, wheezes CV: RRR, no murmur, rubs or gallops Abd: non-distended, tender to palpation in RUQ, +BS in all 4 quadrants  Mallampati Score:     Imaging: No results found.  Labs:  CBC:  Recent Labs  11/26/15 1103  WBC 6.2  HGB 11.6*  HCT 36.8  PLT 270    COAGS:  Recent Labs  11/26/15 1103  INR 0.94  APTT 29    BMP: No results for input(s): NA, K, CL, CO2, GLUCOSE, BUN, CALCIUM, CREATININE, GFRNONAA, GFRAA in the last 8760 hours.  Invalid input(s): CMP  LIVER FUNCTION TESTS: No results for input(s): BILITOT, AST, ALT, ALKPHOS, PROT, ALBUMIN in the last 8760 hours.  TUMOR MARKERS: No results for input(s): AFPTM, CEA, CA199, CHROMGRNA in the last 8760 hours.  Assessment and Plan:  [name] is a 40 y.o. female with past medical history significant for stage II cervical cancer diagnosed  in 2016, 1 year s/p brachytherapy.  Presents today for image guided biopsy of right hepatic lobe lesion found on MRA 10/4 at [place] pending.  Risks and benefits discussed with the patient/husband including, but not limited to bleeding, infection, damage to adjacent structures or low yield requiring additional tests.All of the patient's questions were answered, patient is agreeable to proceed.Consent signed and in chart.   Thank you for this interesting consult.  I greatly enjoyed meeting Errica Lauletta and look forward to participating in their care.  A copy of this report was sent to the requesting provider on this date.  Electronically Signed: D. Rowe Robert 11/26/2015, 11:39 AM   I spent a total of 39min in face to face in clinical consultation, greater than 50% of which was counseling/coordinating care for image guided biopsy of right hepatic lobe  lesion.

## 2015-11-26 NOTE — Discharge Instructions (Signed)
Liver Biopsy °The liver is a large organ in the upper right-hand side of your abdomen. A liver biopsy is a procedure in which a tissue sample is taken from the liver and examined under a microscope. The procedure is done to confirm a suspected problem. °There are three types of liver biopsies: °· Percutaneous. In this type, an incision is made in your abdomen. The sample is removed through the incision with a needle. °· Laparoscopic. In this type, several incisions are made in the abdomen. A tiny camera is passed through one of the incisions to help guide the health care provider. The sample is removed through the other incision or incisions. °· Transjugular. In this type, an incision is made in the neck. A tube is passed through the incision to the liver. The sample is removed through the tube with a needle. °LET YOUR HEALTH CARE PROVIDER KNOW ABOUT: °· Any allergies you have. °· All medicines you are taking, including vitamins, herbs, eye drops, creams, and over-the-counter medicines. °· Previous problems you or members of your family have had with the use of anesthetics. °· Any blood disorders you have. °· Previous surgeries you have had. °· Medical conditions you have. °· Possibility of pregnancy, if this applies. °RISKS AND COMPLICATIONS °Generally, this is a safe procedure. However, problems can occur and include: °· Bleeding. °· Infection. °· Bruising. °· Collapsed lung. °· Leak of digestive juices (bile) from the liver or gallbladder. °· Problems with heart rhythm. °· Pain at the biopsy site or in the right shoulder. °· Low blood pressure (hypotension). °· Injury to nearby organs or tissues. °BEFORE THE PROCEDURE °· Your health care provider may do some blood or urine tests. These will help your health care provider learn how well your kidneys and liver are working and how well your blood clots. °· Ask your health care provider if you will be able to go home the day of the procedure. Arrange for someone to  take you home and stay with you for at least 24 hours. °· Do not eat or drink anything after midnight on the night before the procedure or as directed by your health care provider. °· Ask your health care provider about: °· Changing or stopping your regular medicines. This is especially important if you are taking diabetes medicines or blood thinners. °· Taking medicines such as aspirin and ibuprofen. These medicines can thin your blood. Do not take these medicines before your procedure if your health care provider asks you not to. °PROCEDURE °Regardless of the type of biopsy that will be done, you will have an IV line placed. Through this line, you will receive fluids and medicine to relax you. If you will be having a laparoscopic biopsy, you may also receive medicine through this line to make you sleep during the procedure (general anesthetic). °Percutaneous Liver Biopsy °· You will positioned on your back, with your right hand over your head. °· A health care provider will locate your liver by tapping and pressing on the right side of your abdomen or with the help of an ultrasound machine or CT scan. °· An area at the bottom of your last right rib will be numbed. °· An incision will be made in the numbed area. °· The biopsy needle will be inserted into the incision. °· Several samples of liver tissue will be taken with the biopsy needle. You will be asked to hold your breath as each sample is taken. °Laparoscopic Liver Biopsy °· You will be   positioned on your back. °· Several small incisions will be made in your abdomen. °· Your doctor will pass a tiny camera through one incision. The camera will allow the liver to be viewed on a TV monitor in the operating room. °· Tools will be passed through the other incision or incisions. These tools will be used to remove samples of liver tissue. °Transjugular Liver Biopsy °· You will be positioned on your back on an X-ray table, with your head turned to your left. °· An  area on your neck just over your jugular vein will be numbed. °· An incision will be made in the numbed area. °· A tiny tube will be inserted through the incision. It will be pushed through the jugular vein to a blood vessel in the liver called the hepatic vein. °· Dye will be inserted through the tube, and X-rays will be taken. The dye will make the blood vessels in the liver light up on the X-rays. °· The biopsy needle will be pushed through the tube until it reaches the liver. °· Samples of liver tissue will be taken with the biopsy needle. °· The needle and the tube will be removed. °After the samples are obtained, the incision or incisions will be closed. °AFTER THE PROCEDURE °· You will be taken to a recovery area. °· You may have to lie on your right side for 1-2 hours. This will prevent bleeding from the biopsy site. °· Your progress will be watched. Your blood pressure, pulse, and the biopsy site will be checked often. °· You may have some pain or feel sick. If this happens, tell your health care provider. °· As you begin to feel better, you will be offered ice and beverages. °· You may be allowed to go home when the medicines have worn off and you can walk, drink, eat, and use the bathroom. °  °This information is not intended to replace advice given to you by your health care provider. Make sure you discuss any questions you have with your health care provider. °  °Document Released: 04/10/2003 Document Revised: 02/08/2014 Document Reviewed: 03/16/2013 °[employer] Interactive Patient Education ©2016 [employer] °Liver Biopsy, Care After °Refer to this sheet in the next few weeks. These instructions provide you with information on caring for yourself after your procedure. Your health care provider may also give you more specific instructions. Your treatment has been planned according to current medical practices, but problems sometimes occur. Call your health care provider if you have any problems or  questions after your procedure. °WHAT TO EXPECT AFTER THE PROCEDURE °After your procedure, it is typical to have the following: °· A small amount of discomfort in the area where the biopsy was done and in the right shoulder or shoulder blade. °· A small amount of bruising around the area where the biopsy was done and on the skin over the liver. °· Sleepiness and fatigue for the rest of the day. °HOME CARE INSTRUCTIONS  °· Rest at home for 1-2 days or as directed by your health care provider. °· Have a friend or family member stay with you for at least 24 hours. °· Because of the medicines used during the procedure, you should not do the following things in the first 24 hours: °¨ Drive. °¨ Use machinery. °¨ Be responsible for the care of other people. °¨ Sign legal documents. °¨ Take a bath or shower. °· There are many different ways to close and cover an incision, including stitches,   skin glue, and adhesive strips. Follow your health care provider's instructions on: °¨ Incision care. °¨ Bandage (dressing) changes and removal. °¨ Incision closure removal. °· Do not drink alcohol in the first week. °· Do not lift more than 5 pounds or play contact sports for 2 weeks after this test. °· Take medicines only as directed by your health care provider. Do not take medicine containing aspirin or non-steroidal anti-inflammatory medicines such as ibuprofen for 1 week after this test. °· It is your responsibility to get your test results. °SEEK MEDICAL CARE IF:  °· You have increased bleeding from an incision that results in more than a small spot of blood. °· You have redness, swelling, or increasing pain in any incisions. °· You notice a discharge or a bad smell coming from any of your incisions. °· You have a fever or chills. °SEEK IMMEDIATE MEDICAL CARE IF:  °· You develop swelling, bloating, or pain in your abdomen. °· You become dizzy or faint. °· You develop a rash. °· You are nauseous or vomit. °· You have difficulty  breathing, feel short of breath, or feel faint. °· You develop chest pain. °· You have problems with your speech or vision. °· You have trouble balancing or moving your arms or legs. °  °This information is not intended to replace advice given to you by your health care provider. Make sure you discuss any questions you have with your health care provider. °  °Document Released: 08/07/2004 Document Revised: 02/08/2014 Document Reviewed: 03/16/2013 °[employer] Interactive Patient Education ©2016 [employer] °Moderate Conscious Sedation, Adult, Care After °Refer to this sheet in the next few weeks. These instructions provide you with information on caring for yourself after your procedure. Your health care provider may also give you more specific instructions. Your treatment has been planned according to current medical practices, but problems sometimes occur. Call your health care provider if you have any problems or questions after your procedure. °WHAT TO EXPECT AFTER THE PROCEDURE  °After your procedure: °· You may feel sleepy, clumsy, and have poor balance for several hours. °· Vomiting may occur if you eat too soon after the procedure. °HOME CARE INSTRUCTIONS °· Do not participate in any activities where you could become injured for at least 24 hours. Do not: °¨ Drive. °¨ Swim. °¨ Ride a bicycle. °¨ Operate heavy machinery. °¨ Cook. °¨ Use power tools. °¨ Climb ladders. °¨ Work from a high place. °· Do not make important decisions or sign legal documents until you are improved. °· If you vomit, drink water, juice, or soup when you can drink without vomiting. Make sure you have little or no nausea before eating solid foods. °· Only take over-the-counter or prescription medicines for pain, discomfort, or fever as directed by your health care provider. °· Make sure you and your family fully understand everything about the medicines given to you, including what side effects may occur. °· You should not drink alcohol,  take sleeping pills, or take medicines that cause drowsiness for at least 24 hours. °· If you smoke, do not smoke without supervision. °· If you are feeling better, you may resume normal activities 24 hours after you were sedated. °· Keep all appointments with your health care provider. °SEEK MEDICAL CARE IF: °· Your skin is pale or bluish in color. °· You continue to feel nauseous or vomit. °· Your pain is getting worse and is not helped by medicine. °· You have bleeding or swelling. °· You are   still sleepy or feeling clumsy after 24 hours. °SEEK IMMEDIATE MEDICAL CARE IF: °· You develop a rash. °· You have difficulty breathing. °· You develop any type of allergic problem. °· You have a fever. °MAKE SURE YOU: °· Understand these instructions. °· Will watch your condition. °· Will get help right away if you are not doing well or get worse. °  °This information is not intended to replace advice given to you by your health care provider. Make sure you discuss any questions you have with your health care provider. °  °Document Released: 11/08/2012 Document Revised: 02/08/2014 Document Reviewed: 11/08/2012 °[employer] Interactive Patient Education ©2016 [employer] ° °

## 2015-11-26 NOTE — Procedures (Signed)
Post-Procedure Note  Pre-operative Diagnosis: Liver mass        Post-operative Diagnosis: Liver abscess   Indications: Suspicious liver lesion  Procedure Details:   US guided placement of 17 gauge needle in liver lesion.  Yellow purulent fluid started draining from needle.  13 ml of yellow purulent fluid was removed.  Findings: Lesion near hepatic dome.  Aspirated 13 ml of yellow purulent fluid.  Complications: None     Condition: Good  Plan: Bedrest 3 hours.  Give Zosyn IV due to concern for bacteremia.  Will send fluid for culture and cytology.

## 2015-11-27 ENCOUNTER — Telehealth: Payer: Self-pay | Admitting: Gynecologic Oncology

## 2015-11-27 NOTE — Telephone Encounter (Signed)
Patient spoke with Franki Cabot, Admin Assistant and said she was doing well.  Mild soreness reported but denies fever, chills, or any other symptoms.  Advised to call for any new symptoms etc.  Dr. Sondra Come informed of the situation as well.

## 2015-11-27 NOTE — Telephone Encounter (Signed)
Attempted to call patient to see how she was doing after her liver biopsy.  Unable to leave a message.  Will retry later today.

## 2015-12-01 ENCOUNTER — Telehealth: Payer: Self-pay | Admitting: Gynecologic Oncology

## 2015-12-01 LAB — AEROBIC/ANAEROBIC CULTURE W GRAM STAIN (SURGICAL/DEEP WOUND): Culture: NO GROWTH

## 2015-12-01 NOTE — Telephone Encounter (Signed)
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  She also has signs of a necrotic metastasis with possible bacterial superinfection. No symptoms of infection.  We are awaiting final culture results, and will treat with targeted antibiotics after that is resulted.  I will discuss her case with Dr Marko Plume, but might want to consider ensuring her liver abscess has been treated prior to starting chemotherapy : carboplatin/taxol/avastin.

## 2015-12-02 ENCOUNTER — Telehealth: Payer: Self-pay | Admitting: *Deleted

## 2015-12-02 ENCOUNTER — Encounter (HOSPITAL_COMMUNITY): Payer: Medicaid - Out of State

## 2015-12-02 NOTE — Telephone Encounter (Signed)
Notified patient of future scheduled appointments. Pt has appointments scheduled on 12/09/15 and 12/12/15. Pt agreed with appointment dates and times

## 2015-12-08 ENCOUNTER — Other Ambulatory Visit: Payer: Medicaid - Out of State

## 2015-12-09 ENCOUNTER — Encounter: Payer: Self-pay | Admitting: *Deleted

## 2015-12-09 ENCOUNTER — Other Ambulatory Visit: Payer: Medicaid - Out of State

## 2015-12-09 ENCOUNTER — Encounter (HOSPITAL_COMMUNITY)
Admission: RE | Admit: 2015-12-09 | Discharge: 2015-12-09 | Disposition: A | Discharge status: Home / Self Care | Payer: Medicaid - Out of State | Source: Ambulatory Visit | Attending: Gynecologic Oncology | Admitting: Gynecologic Oncology

## 2015-12-09 DIAGNOSIS — C539 Malignant neoplasm of cervix uteri, unspecified: Secondary | ICD-10-CM | POA: Diagnosis present

## 2015-12-09 DIAGNOSIS — R160 Hepatomegaly, not elsewhere classified: Secondary | ICD-10-CM | POA: Diagnosis present

## 2015-12-09 LAB — GLUCOSE, CAPILLARY: Glucose-Capillary: 76 mg/dL (ref 65–99)

## 2015-12-09 MED ORDER — FLUDEOXYGLUCOSE F - 18 (FDG) INJECTION: 5.0000 | Freq: Once | INTRAVENOUS | Status: DC | PRN

## 2015-12-09 NOTE — Progress Notes (Signed)
Spent time with patient in chemo class reviewing side effects of Cisplatin.  See Education notes

## 2015-12-12 ENCOUNTER — Telehealth: Payer: Self-pay

## 2015-12-12 ENCOUNTER — Encounter: Payer: Self-pay | Admitting: Oncology

## 2015-12-12 ENCOUNTER — Other Ambulatory Visit: Payer: Self-pay

## 2015-12-12 ENCOUNTER — Ambulatory Visit (HOSPITAL_BASED_OUTPATIENT_CLINIC_OR_DEPARTMENT_OTHER): Payer: PRIVATE HEALTH INSURANCE | Admitting: Oncology

## 2015-12-12 ENCOUNTER — Other Ambulatory Visit: Payer: Self-pay | Admitting: Oncology

## 2015-12-12 ENCOUNTER — Other Ambulatory Visit (HOSPITAL_BASED_OUTPATIENT_CLINIC_OR_DEPARTMENT_OTHER): Payer: Medicaid - Out of State

## 2015-12-12 VITALS — BP 105/65 | HR 72 | Temp 97.7°F | Resp 18 | Ht 61.0 in | Wt 90.7 lb

## 2015-12-12 DIAGNOSIS — C787 Secondary malignant neoplasm of liver and intrahepatic bile duct: Secondary | ICD-10-CM | POA: Diagnosis not present

## 2015-12-12 DIAGNOSIS — R634 Abnormal weight loss: Secondary | ICD-10-CM | POA: Diagnosis not present

## 2015-12-12 DIAGNOSIS — G620 Drug-induced polyneuropathy: Secondary | ICD-10-CM | POA: Diagnosis not present

## 2015-12-12 DIAGNOSIS — C539 Malignant neoplasm of cervix uteri, unspecified: Secondary | ICD-10-CM | POA: Diagnosis not present

## 2015-12-12 DIAGNOSIS — T451X5A Adverse effect of antineoplastic and immunosuppressive drugs, initial encounter: Secondary | ICD-10-CM

## 2015-12-12 DIAGNOSIS — I878 Other specified disorders of veins: Secondary | ICD-10-CM

## 2015-12-12 DIAGNOSIS — R61 Generalized hyperhidrosis: Secondary | ICD-10-CM

## 2015-12-12 DIAGNOSIS — E639 Nutritional deficiency, unspecified: Secondary | ICD-10-CM

## 2015-12-12 DIAGNOSIS — G893 Neoplasm related pain (acute) (chronic): Secondary | ICD-10-CM | POA: Insufficient documentation

## 2015-12-12 DIAGNOSIS — D500 Iron deficiency anemia secondary to blood loss (chronic): Secondary | ICD-10-CM | POA: Diagnosis not present

## 2015-12-12 HISTORY — DX: Malignant neoplasm of cervix uteri, unspecified: C53.9

## 2015-12-12 LAB — IRON AND TIBC
%SAT: 9 % — AB (ref 21–57)
Iron: 21 ug/dL — ABNORMAL LOW (ref 41–142)
TIBC: 235 ug/dL — ABNORMAL LOW (ref 236–444)
UIBC: 215 ug/dL (ref 120–384)

## 2015-12-12 LAB — CBC WITH DIFFERENTIAL/PLATELET
BASO%: 0.4 % (ref 0.0–2.0)
Basophils Absolute: 0 10*3/uL (ref 0.0–0.1)
EOS%: 0.5 % (ref 0.0–7.0)
Eosinophils Absolute: 0 10*3/uL (ref 0.0–0.5)
HCT: 37.6 % (ref 34.8–46.6)
HEMOGLOBIN: 12 g/dL (ref 11.6–15.9)
LYMPH%: 15.1 % (ref 14.0–49.7)
MCH: 28.4 pg (ref 25.1–34.0)
MCHC: 31.9 g/dL (ref 31.5–36.0)
MCV: 89.1 fL (ref 79.5–101.0)
MONO#: 0.3 10*3/uL (ref 0.1–0.9)
MONO%: 5.3 % (ref 0.0–14.0)
NEUT%: 78.7 % — ABNORMAL HIGH (ref 38.4–76.8)
NEUTROS ABS: 5 10*3/uL (ref 1.5–6.5)
Platelets: 289 10*3/uL (ref 145–400)
RBC: 4.22 10*6/uL (ref 3.70–5.45)
RDW: 15.5 % — AB (ref 11.2–14.5)
WBC: 6.4 10*3/uL (ref 3.9–10.3)
lymph#: 1 10*3/uL (ref 0.9–3.3)

## 2015-12-12 LAB — COMPREHENSIVE METABOLIC PANEL
ALT: 8 U/L (ref 0–55)
AST: 15 U/L (ref 5–34)
Albumin: 3.2 g/dL — ABNORMAL LOW (ref 3.5–5.0)
Alkaline Phosphatase: 148 U/L (ref 40–150)
Anion Gap: 12 mEq/L — ABNORMAL HIGH (ref 3–11)
BILIRUBIN TOTAL: 0.35 mg/dL (ref 0.20–1.20)
BUN: 8.4 mg/dL (ref 7.0–26.0)
CO2: 26 meq/L (ref 22–29)
CREATININE: 0.7 mg/dL (ref 0.6–1.1)
Calcium: 10.5 mg/dL — ABNORMAL HIGH (ref 8.4–10.4)
Chloride: 102 mEq/L (ref 98–109)
GLUCOSE: 87 mg/dL (ref 70–140)
Potassium: 4 mEq/L (ref 3.5–5.1)
SODIUM: 140 meq/L (ref 136–145)
TOTAL PROTEIN: 9.3 g/dL — AB (ref 6.4–8.3)

## 2015-12-12 LAB — DRAW EXTRA CLOT TUBE

## 2015-12-12 LAB — FERRITIN: Ferritin: 205 ng/ml (ref 9–269)

## 2015-12-12 NOTE — Progress Notes (Signed)
[place] NEW PATIENT EVALUATION   Name: Erika [name] Date: December 12, 2015  MRN: [id number] DOB: April 13, 1975  REFERRING PHYSICIAN: Everitt Amber CC Gery Pray, Lynne Leader, MD). Karlyn Agee (no PCP)  Case discussed at gyn oncology multidisciplinary conference on 11-24-15; outside records from [facility] radiation oncology and medical oncology requested and reviewed by MD and will be scanned into EMR; evaluation and coordination of care discussed with gyn oncology team.   REASON FOR REFERRAL: recurrent cervical cancer, metastatic to liver      HISTORY OF PRESENT ILLNESS:Erika [name] is a very pleasant 40 yo lady who is seen in consultation , together with significant other, at the request of Dr Denman George, with new diagnosis of metastatic squamous cell carcinoma of cervix.   Patient was initially diagnosed with squamous cell carcinoma of cervix when she presented to [facility] ED with vaginal hemorrhage on 01-30-14. She was seen by Dr Evie Lacks, with cervical mass identified and high grade cervical dysplasia on biopsy. She was referred to Dr Denman George, with cervical biopsy 02-16-14 ([id number]) invasive squamous cell carcinoma, clinical stage IIB with involvement of right parametria. PET 02-25-14 additionally had uptake in bilateral pelvic nodes. As patient lives in [place], [place], she was treated with radiation by Dr Sondra Come in [place], given with sensitizing weekly CDDP by Dr Everardo All. Peripheral venous access was not adequate for chemo, such that she had PAC by Dr R.Cathey (removed after that treatment completed). Chemo was weekly CDDP 40 mg/m2 from 03-08-14 thru 04-03-14. Radiation was external beam followed by vaginal brachytherapy, 45 Gy to pelvis with sidewall boost 9 Gy and HDR 28.5 Gy in 5 fractions, from 03-07-14 thru 04-24-14.  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 She used zofran for chemo nausea, which was severe particularly with first few CDDP treatments. She has been postmenopausal since radiation/ chemo, on prempro by gyn oncology. She had radiation diarrhea, which resolved after treatment, baseline then formed bowel movements twice daily until recent different rectal symptoms.  Patient has been followed with alternating visits by Drs Denman George (04-2015), Kinard (07-2015) and Buist. She felt well until last few months, when she had new localized pain RUQ below ribs anteriorly. Additionally she had decreased appetite with some nausea, weight loss of 8 lbs in 4 months, early satiety, increased night sweats without fever or chills, and now ~ 2 weeks of rectal discomfort with tenesmus/ pressure.  The RUQ pain progressively increased until so severe that she was unable to tolerate, at which time she was seen at [facility] ED.  MR/ MRA abdomen at [facility] 11-05-15 showed 7.0 x 5.3 cm area in left hepatic lobe with central 3.1 x 2.7 x 2.3 cm central complex cystic area, as well as apparent subcapsular foci. US aspiration of the left hepatic lobe 11-26-15 yielded 13 cc of yellow purulent fluid, after which pain improved and po intake easier; pathology ([id number]) highly suspicious for squamous cell carcinoma, with aerobic and anaerobic cultures no growth. PET 12-09-15 compared with 07-12-14 and other imaging showed new 1.2 cm right parasternal/ internal mammary soft tissue, no pulmonary nodules or chest adenopathy, right hepatic lobe mass 5.1 x 7 cm with SUV 20.7 (previously SUV 5.3 with no CT correlate 07-2014), small hypermetabolic lesions along capsular surface lateral right hepatic lobe, uptake in porta hepatis, no other hypermetabolic nodes abdomen/ pelvis. PET also shows new hypermetabolic activity in  rectum with SUV 20.2 with diffuse rectal wall thickening and irregular low attenuation area between anterior rectum and posterior  uterine wall 2.5 x 4.9 cm "suspicious for abscess" per radiologist.  Patient has discussed situation with Dr Denman George by phone, with recommendation for systemic treatment with carboplatin taxol avastin.    REVIEW OF SYSTEMS  No bleeding, has been taking OTC iron daily. Night sweats or hot flashes at hs baseline since 2016 treatment had been minimal sweating upper back; for several weeks prior to aspiration of the liver area these had been drenching sweats. Usual weight at most 110 lbs, down 8 lbs in last 4 months. Poor appetite, does drink fluids well (lemonade, juice, occasional boost) but little food in last several weeks. No GERD. SOB "walking all of [employer]" , no cough, no history asthma. No other pain. Some broken teeth, has dentist in [place] who will address promptly with my note requesting this now. No cardiac symptoms. No HA, good visual acuity, no difficulty hearing. No known thyroid disease. No LE swelling. Bladder ok. Peripheral neuropathy as noted. Peripheral venous access difficult Right shoulder soreness since liver aspiration, not positional, full ROM, no rash.   Remainder of full 10 point review of systems negative.   ALLERGIES: Patient has no known allergies.  PAST MEDICAL/ SURGICAL HISTORY:    G2P2, daughters ages 26 and 32 C section History of anemia from gyn bleeding PAC 2016 Dr Ladona Horns, removed after chemo completed  No flu vaccine, which she declined today but likely need to re-address.  CURRENT MEDICATIONS: reviewed as listed now in EMR Prempro refilled x 4 weeks now. This was originally from gyn oncology.  Suggested adding B complex for the neuropathy. Recommended continuing multivitamin due to poor nutritional intake now. Recommended continuing oral iron.  Note zofran useful with CDDP. She is comfortable with prn aleve, continue for now but understands that may need to adjust if chemo thrombocytopenia  SOCIAL HISTORY:  From [place], lives with significant  other and 71 yo daughter. 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  Smoked 1/2 ppd x 6 yrs thru 2013.    FAMILY HISTORY:  Father died of MI age 77 Mother died with asthma Sister with "blood clots" and cellulitis Daughters healthy No other known cancer    PHYSICAL EXAM:  height is 5' 1"  (1.549 m) and weight is 90 lb 11.2 oz (41.1 kg). Her oral temperature is 97.7 F (36.5 C). Her blood pressure is 105/65 and her pulse is 72. Her respiration is 18 and oxygen saturation is 100%.  Alert, pleasant, cooperative lady, excellent historian, small build but thin. Looks comfortable. Respirations not labored with activity in exam room.   Significant other very supportive.  HEENT: normal hair pattern. PERRL, not icteric. Oral mucosa moist and clear, posterior pharynx clear. Right upper incisor broken, and some missing teeth. Neck supple without thyroid mass or JVD.  RESPIRATORY: lungs clear to A and P  CARDIAC/ VASCULAR: heart RRR no gallop, clear heart sounds. Peripheral pulses symmetrical and intact. Peripheral veins UE do not appear adequate for chemo  ABDOMEN: soft, not tender including RUQ, active BS, not distended, no liver edge or splenomegaly  LYMPH NODES: no cervical, supraclavicular, axillary or inguinal adenopathy  BREASTS: bilaterally without dominant mass, skin or nipple findings  NEUROLOGIC: peripheral neuropathy tips of fingers bilaterally and toes bilaterally. Otherwise no focal deficits. PSYCH a little anxious, appropriate mood and affect  SKIN: without rash including  right shoulder and lower right rib area. No ecchymosis, petechiae  MUSCULOSKELETAL: back not tender. UE/ LE without edema, cords, tenderness. Full ROM right shoulder, not tender to palpation    LABORATORY DATA:  Results for orders placed or performed in visit on 12/12/15 (from the past 48 hour(s))  CBC with Differential     Status: Abnormal   Collection  Time: 12/12/15  9:20 AM  Result Value Ref Range   WBC 6.4 3.9 - 10.3 10e3/uL   NEUT# 5.0 1.5 - 6.5 10e3/uL   HGB 12.0 11.6 - 15.9 g/dL   HCT 37.6 34.8 - 46.6 %   Platelets 289 145 - 400 10e3/uL   MCV 89.1 79.5 - 101.0 fL   MCH 28.4 25.1 - 34.0 pg   MCHC 31.9 31.5 - 36.0 g/dL   RBC 4.22 3.70 - 5.45 10e6/uL   RDW 15.5 (H) 11.2 - 14.5 %   lymph# 1.0 0.9 - 3.3 10e3/uL   MONO# 0.3 0.1 - 0.9 10e3/uL   Eosinophils Absolute 0.0 0.0 - 0.5 10e3/uL   Basophils Absolute 0.0 0.0 - 0.1 10e3/uL   NEUT% 78.7 (H) 38.4 - 76.8 %   LYMPH% 15.1 14.0 - 49.7 %   MONO% 5.3 0.0 - 14.0 %   EOS% 0.5 0.0 - 7.0 %   BASO% 0.4 0.0 - 2.0 %  Comprehensive metabolic panel     Status: Abnormal   Collection Time: 12/12/15  9:20 AM  Result Value Ref Range   Sodium 140 136 - 145 mEq/L   Potassium 4.0 3.5 - 5.1 mEq/L   Chloride 102 98 - 109 mEq/L   CO2 26 22 - 29 mEq/L   Glucose 87 70 - 140 mg/dl    Comment: Glucose reference range is for nonfasting patients. Fasting glucose reference range is 70- 100.   BUN 8.4 7.0 - 26.0 mg/dL   Creatinine 0.7 0.6 - 1.1 mg/dL   Total Bilirubin 0.35 0.20 - 1.20 mg/dL   Alkaline Phosphatase 148 40 - 150 U/L   AST 15 5 - 34 U/L   ALT 8 0 - 55 U/L   Total Protein 9.3 (H) 6.4 - 8.3 g/dL   Albumin 3.2 (L) 3.5 - 5.0 g/dL   Calcium 10.5 (H) 8.4 - 10.4 mg/dL   Anion Gap 12 (H) 3 - 11 mEq/L   EGFR >90 >90 ml/min/1.73 m2    Comment: eGFR is calculated using the CKD-EPI Creatinine Equation (2009)  Draw extra clot tube     Status: None   Collection Time: 12/12/15  9:20 AM  Result Value Ref Range   Extra Tube Sample held in lab for 7 days   Iron and TIBC     Status: Abnormal   Collection Time: 12/12/15  9:20 AM  Result Value Ref Range   Iron 21 (L) 41 - 142 ug/dL   TIBC 235 (L) 236 - 444 ug/dL   UIBC 215 120 - 384 ug/dL   %SAT 9 (L) 21 - 57 %  Ferritin     Status: None   Collection Time: 12/12/15  9:20 AM  Result Value Ref Range   Ferritin 205 9 - 269 ng/ml     US aspiration  of liver 12-09-15 Aerobic/Anaerobic Culture (surgical/deep wound)      Specimen Description ABSCESS LIVER   Special Requests NONE   Gram Stain ABUNDANT WBC PRESENT,BOTH PMN AND MONONUCLEAR RARE GRAM NEGATIVE RODS   Culture No growth aerobically or anaerobically. Performed at [facility]   Report  Status 12/01/2015 FINAL   Resulting Agency [facility]       Patient does not know that she has had TFTs checked.  I do not find HIV assessed in information available to me now.   PATHOLOGY: FINAL for Erika [name], Erika [name] ([id number]) Patient: Erika [name] Collected: 02/16/2014 Client: [facility] Accession: [id number] Received: 02/16/2014 Everitt Amber, MD DOB: 26-Apr-1975 Age: 73 Gender: F Reported: 02/18/2014 [address]PORT OF SURGICAL PATHOLOGYFINAL DIAGNOSIS Diagnosis Cervix, biopsy - SQUAMOUS CELL CARCINOMA. Microscopic Comment There is insufficient stroma present to estimate degree of invasion.  '  FINAL for Erika [name], Erika [name] ([id number]) Patient: Erika [name], Erika [name] Collected: 11/26/2015 Client: [facility] Accession: [id number] Received: 11/26/2015 Farrel Conners REPORT Adequacy Reason Satisfactory For Evaluation. Diagnosis LIVER, FINE NEEDLE ASPIRATION, HEPATIC DOME(SPECIMEN 1 OF 1 COLLECTED 11/26/15): NECROSIS WITH RARE ATYPICAL CELLS HIGHLY SUSPICIOUS FOR METASTATIC SQUAMOUS CELL CARCINOMA, SEE COMMENT. Vicente Males MD  Specimen Clinical Information cervical cancer and indeterminate liver lesion, liver lesion concern for necrotic mass vs abscess Source Liver, Fine Needle Aspiration, Hepatic dome (specimen 1 of 1 collected 11/26/15) Gross Specimen: Received is/are 5 cc's of cloudy peach fluid. (BS:bs) Prepared: # Smears: 0 # Concentration Technique Slides (i.e. ThinPrep): 1 # Cell Block: 1 Additional Studies: n/a Comment There is abundant necrosis with scattered atypical cells. The necrotic material and the scattered viable cells stain with cytokeratin 5/6 and p63.  Combined with the patient's history, these findings are highly suspicious for metastatic squamous cell carcinoma.     RADIOGRAPHY: EXAM: NUCLEAR MEDICINE PET SKULL BASE TO THIGH  TECHNIQUE: 5.0 mCi F-18 FDG was injected intravenously. Full-ring PET imaging was performed from the skull base to thigh after the radiotracer. CT data was obtained and used for attenuation correction and anatomic localization.  FASTING BLOOD GLUCOSE:  Value: 76 mg/dl  COMPARISON:  07/12/2014  FINDINGS: NECK  No hypermetabolic lymph nodes in the neck.  CHEST  New 1.2 cm right parasternal/internal mammary soft tissue density is seen which shows FDG uptake with SUV max of 7.0. No other hypermetabolic mediastinal or hilar lymph nodes identified. No suspicious pulmonary nodules on the CT scan.  ABDOMEN/PELVIS  Increased size of hypermetabolic mass with central cystic area is seen in the lateral dome of the right hepatic lobe. This currently measures 5.1 x 7.0 cm on image 120/4, compared to 1.6 x 1.1 cm previously. This has current SUV max of 20.7 compared to 5.3 previously . New small hypermetabolic low-attenuation lesions are now seen along capsular surface of the lateral right hepatic lobe. This is consistent with progressive liver metastases.  New FDG uptake is seen in the porta hepatis with SUV max of 5.0. This is suspicious for hypermetabolic lymphadenopathy which is difficult to measure or visualized in this region due to lack of IV contrast and paucity of intra-abdominal fat. No other hypermetabolic lymphadenopathy identified within the abdomen or pelvis.  Stable small uterine fibroids show no hypermetabolic activity. No residual hypermetabolic cervical mass identified.  New hypermetabolic activity is seen involving the rectum, which corresponds to diffuse rectal wall thickening seen on CT. This has an SUV max of 20.2. There is a irregular low-attenuation area seen between  the anterior rectal wall and posterior uterine wall which measures 2.5 x 4.9 cm on image 183/4. This shows surrounding hypermetabolic activity, and is suspicious for abscess.  SKELETON  No focal hypermetabolic activity to suggest skeletal metastasis. Increased diffuse bone marrow metabolic activity is seen, consistent with treatment response.  IMPRESSION: New small hypermetabolic right internal mammary  soft tissue density, suspicious for metastatic lymphadenopathy.  Increased size and number of hypermetabolic liver metastases. New mild hypermetabolic activity in porta hepatis is suspicious for lymph node metastases.  No residual hypermetabolic cervical mass identified. Multiple uterine fibroids again seen without FDG uptake.  New diffuse rectal wall thickening with hypermetabolic activity, suspicious for proctitis which could be infectious in etiology or secondary to radiation changes ; recommend clinical correlation. Small pelvic fluid collection between the anterior rectal wall and posterior uterine wall, possibly due to abscess.    PACs images reviewed by MD    DISCUSSION: All of history above reviewed in detail with patient and friend. I have explained findings on PET, pathology information from the liver aspiration, and negative cultures also from the liver aspiration. I have explained that squamous cell cancers have tendency to develop central necrosis, which seems to be the situation with this liver area. We have discussed radiation colitis/ proctitis, however worsening symptoms of rectal pressure and frequency in last few weeks suggests that the findings on imaging in rectal area may be more active disease there. I have let Dr Denman George know this situation, and will refer for possible lower endoscopy/ possible biopsy to be sure we are not missing infection, separate anorectal cancer etc.  We have discussed fact that recurrent, metastatic squamous cell carcinoma of cervix  is often responsive to chemotherapy, tho this is given in attempt to improve and control the disease but not expected to be curative. We have discussed quality of life issues and possibility of increased peripheral neuropathy with platin and taxane; I have explained that benefit from those agents may be worth risk of some increase in neuropathy, tho this will be followed closely and regimen may need to be adjusted if significant worsening of neuropathy. We have discussed q 3 week chemo, reviewed major side effects of these agents vs CDDP, and discussed rationale for addition of avastin to the chemo. She has had full teaching by RN for carboplatin, taxol and avastin today.  Patient needs PAC for chemo and is in agreement with referral for this. We have discussed antiemetics. I have mentioned decadron premeds for taxol She understands that she will lose hair with this chemo I have told her that imaging after ~ 3 cycles would be reasonable, and that >=6 cycles would be anticipated if she is responding and tolerating adequately. We did not discuss gCSF  We have discussed nutritional needs. As she tolerates liquids best now, we have given samples of Resource and [employer], which she may prefer to Boost etc. I have encouraged her to drink 3-4 supplements daily if not eating well otherwise.  She tells me that she has met with dietician previously.  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  Patient met with [facility] financial counselor today.  I did not talk with her further today re flu vaccine, tho that would obviously be most appropriate with upcoming chemotherapy.   I have offered transferring medical oncology care to [place], as she lives in [place]. She would like to do this. Following visit I have spoken with [facility] and have been able to  set up new patient appointment with Dr Ancil Linsey for 12-23-15 at 1420 -- thank you. Also following visit, I have called all 3 GI groups in [place], none of which accept [place]. I then  called GI in [place], next appointment with that MD Jan 2018, however in same office Dr Ladona Horns (who placed her first [facility]) is able to assist with lower endoscopy as well as with placement of another [facility], appointment with him on 12-15-15 at 0815 -- thank you.      IMPRESSION / PLAN:  1.Metastatic squamous cell carcinoma of cervix involving liver (centrally necrotic large liver met + subcapsular areas), possibly central chest nodes, porta hepatis, possibly rectal area: Recommendation for systemic treatment with carboplatin taxol avastin.  Note anticipated PAC placement and possibly rectal biopsy, such that first treatment may need to be chemo only.  Note residual peripheral neuropathy from CDDP may limit platin/ taxane, but seems ok to begin and follow closely  Initial diagnosis clinical IIB / PET IIIB squamous cell carcinoma of cervix 02-2014, treated with radiation with sensitizing CDDP in [place], completed 04-2014 (Drs Sondra Come and Tressie Stalker).  2. Night sweats likely from metastatic cancer (+/- hot flashes), no fever or chills and cultures from hepatic area no growth aerobic or anaerobic.  3.rectal wall thickening and question of fluid collection vs abscess also there: increasing symptoms in last few weeks suggests this is not all residual radiation effects. Appreciate consideration of proctoscopy or flex sig etc by Dr Ladona Horns, with biopsy if appropriate. Given rest of situation, likely all related to the progressive cervical cancer, but would like to be sure no primary rectal pathology or clear infection if possible. Will need to hold start of avastin for appropriate interval if biopsy 4.poor peripheral IV access: will need PAC for chemo. Will need to hold start of avastin to allow healing from that procedure.   5. 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 No flu vaccine: MD did not discuss with patient today, but likely need to follow up given chemo plans 9.iron deficiency anemia from previous gyn bleeding: Hgb in normal range today, but still iron deficient by labs. She is continuing oral iron. 10. Past minimal tobacco  Patient and accompanying individual have had questions answered to their satisfaction and are in agreement with plan above. They can contact this office for questions or concerns at any time prior to transferring medical oncology care to [facility]. Drs Sondra Come and Denman George will continue to assist in her care.   Time spent 70 min, including >50% discussion and coordination of care. CC Drs Denman George, [place], [place], [place], [place]    [place], MD 12/12/2015 2:09 PM

## 2015-12-12 NOTE — Telephone Encounter (Signed)
-----   Message from Gordy Levan, MD sent at 12/12/2015 12:33 PM EST ----- Please let patient know that [place] offices don't take her [facility]. ( I called [place], [place] and [place]/ [place])  However, I have been able to get an appointment for her in [place] for this Monday 11-13 with Dr Karlyn Agee. Dr Ladona Horns is the surgeon who put in her first portacath, and he is also able to do flexible sigmoidoscopy that we want done to evaluate the rectal area.  (Dr Ladona Horns is in same office with GI physician, which is why I called that office, but the GI MD has no appointments available until January).  Office # [phone], their new location is [address] in [place], patient to arrive 0815 on Mon 10-13. I was pleased that Dr Ladona Horns can see her so promptly - if she cannot make that apt, she should call his office to reschedule and please let us know date of different apt if so.  RN please fax information to Dr Ladona Horns [phone]  including my note from today, labs today, PET 11-7, MR 10-4, note from Dr Sondra Come on RN's desk thanks     thanks

## 2015-12-12 NOTE — Progress Notes (Signed)
Met with patient per physician's request to discuss financial concerns. Advised patient once her treatment plan is in place, she may apply for the one-time [employer] grant. Patient advised to bring her proof of income when she comes for her next visit to apply for the grants and the decision for the grant would be determined at that same visit. Patient states she will be coming here and going to [facility]. Advised patient how the grants can be used. Patient verbalized understanding. Patient has my card for any additional financial questions or concerns.

## 2015-12-12 NOTE — Telephone Encounter (Signed)
Spoke with Ms Erika [name] about appointments noted below by Dr. Marko Plume. She is able to make both appointments and in agreement with plan. Faxed records to Dr. Nettie Elm office attention Manuela Schwartz for visit 12-15-15.

## 2015-12-12 NOTE — Telephone Encounter (Signed)
  Mason Jim Female, 40 y.o., October 13, 1975 Weight:  90 lb 11.2 oz (41.1 kg) Phone:  [phone] PCP:  Everardo All, MD MRN:  [id number] MyChart:  Active Next Appt:  12/23/2015 appointment with Dr Whitney Muse at [place]: Today  Message Contents  Gordy Levan, MD  Baruch Merl, RN Cc: Dorothyann Gibbs, NP        See prior message also re [facility] and flex sig  Please let patient know that our partner in medical oncology, Dr Ancil Linsey, can see her at [facility] on Nov 21 at 2:20.  Since patient will have the [facility] and flex sig set up after she sees Dr Ladona Horns on 11-13, this timing should be good to see Dr Whitney Muse and be able to start chemo at [facility] rather than starting chemo in [place].  Tell patient that I will let Dr Denman George, Dr Sondra Come and Dr Evie Lacks know about all of this.   Thank you

## 2015-12-16 ENCOUNTER — Telehealth: Payer: Self-pay

## 2015-12-16 ENCOUNTER — Other Ambulatory Visit: Payer: Self-pay | Admitting: Oncology

## 2015-12-16 NOTE — Telephone Encounter (Signed)
Return call received, "She is having a procedure."  This nurse provided information from collaborative.

## 2015-12-16 NOTE — Telephone Encounter (Signed)
-----   Message from Gordy Levan, MD sent at 12/16/2015  8:34 AM EST ----- Labs seen and need follow up:   please let her know iron is very low on her labs. Continue iron tablet daily, best absorption is on empty stomach with OJ. I'm sure Dr Whitney Muse will follow this also   thanks

## 2015-12-19 ENCOUNTER — Telehealth: Payer: Self-pay | Admitting: Oncology

## 2015-12-19 NOTE — Telephone Encounter (Signed)
REFERRALS PER 11/10 LOS COMPLETE. PATIENT ALREADY ON SCHEDULE WITH DR Whitney Muse AND DESK NUSE HAS COMPLETED APPOINTMENT WITH DR Everson.

## 2015-12-23 ENCOUNTER — Other Ambulatory Visit (HOSPITAL_COMMUNITY): Payer: Medicaid - Out of State

## 2015-12-23 ENCOUNTER — Encounter (HOSPITAL_COMMUNITY): Payer: Self-pay | Admitting: Hematology & Oncology

## 2015-12-23 ENCOUNTER — Encounter (HOSPITAL_COMMUNITY): Payer: Medicaid - Out of State | Attending: Hematology & Oncology | Admitting: Hematology & Oncology

## 2015-12-23 VITALS — BP 104/70 | HR 88 | Temp 98.3°F | Resp 16 | Wt 91.0 lb

## 2015-12-23 DIAGNOSIS — R634 Abnormal weight loss: Secondary | ICD-10-CM | POA: Diagnosis not present

## 2015-12-23 DIAGNOSIS — T451X5A Adverse effect of antineoplastic and immunosuppressive drugs, initial encounter: Secondary | ICD-10-CM

## 2015-12-23 DIAGNOSIS — C787 Secondary malignant neoplasm of liver and intrahepatic bile duct: Secondary | ICD-10-CM

## 2015-12-23 DIAGNOSIS — C539 Malignant neoplasm of cervix uteri, unspecified: Secondary | ICD-10-CM | POA: Diagnosis not present

## 2015-12-23 DIAGNOSIS — G620 Drug-induced polyneuropathy: Secondary | ICD-10-CM

## 2015-12-23 MED ORDER — DEXAMETHASONE 4 MG PO TABS: 8.0000 mg | ORAL_TABLET | Freq: Every day | ORAL | 1 refills | Status: DC

## 2015-12-23 MED ORDER — PROCHLORPERAZINE MALEATE 10 MG PO TABS: 10.0000 mg | ORAL_TABLET | Freq: Four times a day (QID) | ORAL | 1 refills | Status: DC | PRN

## 2015-12-23 MED ORDER — ONDANSETRON HCL 8 MG PO TABS: 8.0000 mg | ORAL_TABLET | Freq: Two times a day (BID) | ORAL | 1 refills | Status: DC | PRN

## 2015-12-23 MED ORDER — LIDOCAINE-PRILOCAINE 2.5-2.5 % EX CREA
TOPICAL_CREAM | CUTANEOUS | 3 refills | Status: AC
Start: 2015-12-23 — End: ?

## 2015-12-23 MED ORDER — HYDROCODONE-ACETAMINOPHEN 5-325 MG PO TABS: 1.0000 | ORAL_TABLET | Freq: Four times a day (QID) | ORAL | 0 refills | Status: DC | PRN

## 2015-12-23 MED ORDER — ONDANSETRON HCL 8 MG PO TABS: 8.0000 mg | ORAL_TABLET | Freq: Two times a day (BID) | ORAL | 1 refills | Status: AC | PRN

## 2015-12-23 MED ORDER — LIDOCAINE-PRILOCAINE 2.5-2.5 % EX CREA: TOPICAL_CREAM | CUTANEOUS | 3 refills | Status: DC

## 2015-12-23 NOTE — Progress Notes (Signed)
ALERT: Recent Pathways Treatment decision is outdated. Please await next Pathways decision 

## 2015-12-23 NOTE — Progress Notes (Signed)
Met with pt face to face.  Introduced myself and explain a little bit about my role as the patient navigator.  Pt given a card with all my information on it.  Told pt to call if they had any questions or concerns.  Pt verbalized understanding.  I sent over medications zofran, compazine, and emla cream to her pharmacy. Will start treatment next week.

## 2015-12-23 NOTE — Progress Notes (Signed)
[facility] NOTE  Patient Care Team: Everardo All, MD as PCP - General (Oncology)  CHIEF COMPLAINTS/PURPOSE OF CONSULTATION:    Recurrent cervical cancer ([place])   02/16/2014 Procedure    Cervical biopsy by Dr. Denman George      02/18/2014 Pathology Results    Cervix, biopsy - SQUAMOUS CELL CARCINOMA.      03/07/2014 - 04/24/2014 Radiation Therapy    Dr. Sondra Come- vaginal brachytherapy, 45 Gy to pelvis with sidewall boost 9 Gy and HDR 28.5 Gy in 5 fractions      03/08/2014 - 04/03/2014 Chemotherapy    Weekly CDDP 40 mg/m2 by Dr. 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      07/12/2014 PET scan    Marked interval decrease in size and hypermetabolism of the cervical mass.  The bilateral hypermetabolic pelvic sidewall lymphadenopathy has resolved in the interval.  Areas of hypermetabolic brown fat bilaterally in the chest and abdomen.      11/05/2015 Imaging    MRI abdomen at [facility]- 7.0 x 5.3 cm are in left hepatic lobe with central 3.1 x 2.7 x 2.3 cm complex cystic area, as well as an apparent subcapsular foci      11/26/2015 Procedure    US aspiration of left hepatic lobe- yielding 13 cc of yellow purulent fluid (pain resolved and PO intake increased)      11/26/2015 Pathology Results    [id number]- highly suspicious for squamous cell carcinoma      12/09/2015 PET scan    New small hypermetabolic right internal mammary soft tissue density, suspicious for metastatic lymphadenopathy.  Increased size and number of hypermetabolic liver metastases. New mild hypermetabolic activity in porta hepatis is suspicious for lymph node metastases.  No residual hypermetabolic cervical mass identified. Multiple uterine fibroids again seen without FDG uptake.  New diffuse rectal wall thickening with hypermetabolic activity, suspicious for proctitis which could be infectious in etiology  or secondary to radiation changes ; recommend clinical correlation. Small pelvic fluid collection between the anterior rectal wall and posterior uterine wall, possibly due to abscess.               HISTORY OF PRESENTING ILLNESS:  Erika [name] 40 y.o. female is here because of referral from Dr. Marko Plume for new diagnosis of metastatic squamous cell carcinoma of cervix.  Per Dr. Mariana Kaufman 12/12/2015 note: "Patient was initially diagnosed with squamous cell carcinoma of cervix when she presented to [facility] ED with vaginal hemorrhage on 01-30-14. She was seen by Dr Evie Lacks, with cervical mass identified and high grade cervical dysplasia on biopsy. She was referred to Dr Denman George, with cervical biopsy 02-16-14 ([id number]) invasive squamous cell carcinoma, clinical stage IIB with involvement of right parametria. PET 02-25-14 additionally had uptake in bilateral pelvic nodes. As patient lives in [place], [place], she was treated with radiation by Dr Sondra Come in [place], given with sensitizing weekly CDDP by Dr Everardo All. Peripheral venous access was not adequate for chemo, such that she had PAC by Dr R.Cathey (removed after that treatment completed). Chemo was weekly CDDP 40 mg/m2 from 03-08-14 thru 04-03-14. Radiation was external beam followed by vaginal brachytherapy, 45 Gy to pelvis with sidewall boost 9 Gy and HDR 28.5 Gy in 5 fractions, from 03-07-14 thru 04-24-14.  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 She used zofran  for chemo nausea, which was severe particularly with first few CDDP treatments. She has been postmenopausal since radiation/ chemo, on prempro by gyn oncology. She had radiation diarrhea, which resolved after treatment, baseline then formed bowel movements twice daily until recent different rectal symptoms.  Patient has been followed with alternating visits by Drs Denman George (04-2015), Kinard (07-2015)  and Buist. She felt well until last few months, when she had new localized pain RUQ below ribs anteriorly. Additionally she had decreased appetite with some nausea, weight loss of 8 lbs in 4 months, early satiety, increased night sweats without fever or chills, and now ~ 2 weeks of rectal discomfort with tenesmus/ pressure.  The RUQ pain progressively increased until so severe that she was unable to tolerate, at which time she was seen at [facility] ED.  MR/ MRA abdomen at [facility] 11-05-15 showed 7.0 x 5.3 cm area in left hepatic lobe with central 3.1 x 2.7 x 2.3 cm central complex cystic area, as well as apparent subcapsular foci. US aspiration of the left hepatic lobe 11-26-15 yielded 13 cc of yellow purulent fluid, after which pain improved and po intake easier; pathology ([id number]) highly suspicious for squamous cell carcinoma, with aerobic and anaerobic cultures no growth. PET 12-09-15 compared with 07-12-14 and other imaging showed new 1.2 cm right parasternal/ internal mammary soft tissue, no pulmonary nodules or chest adenopathy, right hepatic lobe mass 5.1 x 7 cm with SUV 20.7 (previously SUV 5.3 with no CT correlate 07-2014), small hypermetabolic lesions along capsular surface lateral right hepatic lobe, uptake in porta hepatis, no other hypermetabolic nodes abdomen/ pelvis. PET also shows new hypermetabolic activity in rectum with SUV 20.2 with diffuse rectal wall thickening and irregular low attenuation area between anterior rectum and posterior uterine wall 2.5 x 4.9 cm "suspicious for abscess" per radiologist.  Patient has discussed situation with Dr Denman George by phone, with recommendation for systemic treatment with carboplatin taxol avastin."  Erika [name] presents to the [facility] today unaccompanied.   She saw Dr. Tye Maryland and had a rectal biopsy performed at [facility]. Pathology was not reviewed at that time.   She reports tingling in fingers and toes are not bad, possibly a 7 out of 10. She  is able to "tap out" the feeling. She is able to button shirts and write without issue.  She lives with her boyfriend and youngest daughter, who is 43 yo. Her boyfriend does not work but receives disability. The patient does not currently work. Her oldest daughter is 79 yo and lives outside of the home. The patient is not sure how disability works as she has always worked until now. The patient has not met with Angie yet.   The patient has [place].   The patient would like to meet with nutrition. It has been hard to eat, her appetite comes and goes. If she doesn't eat immediately when she is hungry then the feeling passes and she doesn't eat. She has been drinking smoothies and Theme park manager for breakfast. She has been losing weight, "It keeps just falling off". She often times forgets to eat when she has a busy day.  She reports an intermittent "bee-sting" pain in her liver. This pain pinches and bothers her most at night. She prefers to wait for the pain to go away on its own before taking pain medication.   She denies ever having blood clots.   She has not had a flu shot this year. She is not interested in receiving  one, noting she always gets sick after having one.  Patient is here for further evaluation and discussion of newly diagnosed metastatic squamous cell carcinoma of the cervix and treatment options.    MEDICAL HISTORY:  Past Medical History:  Diagnosis Date  . Anxiety   . Cervical cancer ([place])   . Family history of adverse reaction to anesthesia    pt states her sister has to have the "reversal" after surgery   . Hypokalemia   . Microcytic hypochromic anemia     SURGICAL HISTORY: Past Surgical History:  Procedure Laterality Date  . CESAREAN SECTION    . port a cath placement      January 2016 / right   . TANDEM RING INSERTION N/A 04/09/2014   Procedure: TANDEM RING PLACEMENT FOR HIGH DOSE RATE RADIATION THERAPY EXAM UNDER ANETHESIA PLACEMENT OF  CERVICAL SLEEVE;  Surgeon: Gery Pray, MD;  Location: [facility];  Service: Urology;  Laterality: N/A;  . TANDEM RING INSERTION N/A 04/23/2014   Procedure: TANDEM RING PLACEMENT ;  Surgeon: Gery Pray, MD;  Location: [facility];  Service: Urology;  Laterality: N/A;  . TANDEM RING INSERTION N/A 04/30/2014   Procedure: TANDEM RING PLACEMENT,EXAM UNDER ANESTHESIA ;  Surgeon: Gery Pray, MD;  Location: [facility];  Service: Urology;  Laterality: N/A;  . TANDEM RING INSERTION N/A 05/07/2014   Procedure: TANDEM RING PLACEMENT;  Surgeon: Gery Pray, MD;  Location: [facility];  Service: Urology;  Laterality: N/A;  . TANDEM RING INSERTION N/A 05/17/2014   Procedure: TANDEM RING INSERTION;  Surgeon: Gery Pray, MD;  Location: [facility];  Service: Urology;  Laterality: N/A;  . TUBAL LIGATION      SOCIAL HISTORY: Social History   Social History  . Marital status: Single    Spouse name: N/A  . Number of children: 2  . Years of education: N/A   Occupational History  . cook at [employer] History Main Topics  . Smoking status: Former Smoker    Packs/day: 0.50    Years: 6.00    Types: Cigarettes    Quit date: 08/02/2011  . Smokeless tobacco: Never Used  . Alcohol use No  . Drug use:     Types: Marijuana  . Sexual activity: Yes   Other Topics Concern  . Not on file   Social History Narrative  . No narrative on file   She lives in [place] with her boyfriend and youngest daughter, who is 55 yo. Her boyfriend does not work but receives disability. The patient does not currently work. Her oldest daughter is 12 yo and lives outside of the home. The patient is not sure how disability works as she has always worked until now.  She previously worked in [employer] She has a Magazine features editor, Dealer Non smoker  FAMILY HISTORY: History reviewed. No pertinent family history. Mother deceased of an asthma attack at 57 yo Father deceased of a heart attack in his 45's One sister in her early 16's.  She has blood clots. She had one in her leg which caused cellulitis. She has a work-related disc injury in her back.   ALLERGIES:  has No Known Allergies.  MEDICATIONS:  Current Outpatient Prescriptions  Medication Sig Dispense Refill  . calcium-vitamin D (OSCAL 500/200 D-3) 500-200 MG-UNIT per tablet Take 1 tablet by mouth 2 (two) times daily. 60 tablet 11  . estrogen, conjugated,-medroxyprogesterone (PREMPRO) 0.3-1.5 MG per tablet Take 1 tablet by mouth daily. 30 tablet 11  .  ferrous sulfate 325 (65 FE) MG tablet Take 325 mg by mouth daily with breakfast.    . LORazepam (ATIVAN) 1 MG tablet TAKE 1 TABLET BY MOUTH AT BEDTIME AS NEEDED FOR ANXIETY /INSOMNIA  0  . naproxen sodium (ANAPROX) 220 MG tablet Take 220 mg by mouth 4 (four) times daily as needed.    . ondansetron (ZOFRAN) 4 MG tablet      No current facility-administered medications for this visit.     Review of Systems  Constitutional: Positive for weight loss.       Decreased appetite  HENT: Negative.   Eyes: Negative.   Respiratory: Negative.   Cardiovascular: Negative.   Gastrointestinal: Positive for abdominal pain.       Sharp intermittent liver pain  Genitourinary: Negative.   Musculoskeletal: Negative.   Skin: Negative.   Neurological: Negative.   Endo/Heme/Allergies: Negative.   Psychiatric/Behavioral: Negative.   All other systems reviewed and are negative. 14 point ROS was done and is otherwise as detailed above or in HPI   PHYSICAL EXAMINATION: ECOG PERFORMANCE STATUS: 1 - Symptomatic but completely ambulatory  Vitals:   12/23/15 1409  BP: 104/70  Pulse: 88  Resp: 16  Temp: 98.3 F (36.8 C)   Filed Weights   12/23/15 1409  Weight: 91 lb (41.3 kg)    Physical Exam  Constitutional: She is oriented to person, place, and time and well-developed, well-nourished, and in no distress.  HENT:  Head: Normocephalic and atraumatic.  Nose: Nose normal.  Mouth/Throat: Oropharynx is clear and moist. No  oropharyngeal exudate.  Eyes: Conjunctivae and EOM are normal. Pupils are equal, round, and reactive to light. Right eye exhibits no discharge. Left eye exhibits no discharge. No scleral icterus.  Neck: Normal range of motion. Neck supple. No tracheal deviation present. No thyromegaly present.  Cardiovascular: Normal rate, regular rhythm and normal heart sounds.  Exam reveals no gallop and no friction rub.   No murmur heard. Pulmonary/Chest: Effort normal and breath sounds normal. She has no wheezes. She has no rales.  Abdominal: Soft. Bowel sounds are normal. She exhibits no distension and no mass. There is no tenderness. There is no rebound and no guarding.  Musculoskeletal: Normal range of motion. She exhibits no edema.  Lymphadenopathy:    She has no cervical adenopathy.  Neurological: She is alert and oriented to person, place, and time. She has normal reflexes. No cranial nerve deficit. Gait normal. Coordination normal.  Skin: Skin is warm and dry. No rash noted.  Psychiatric: Mood, memory, affect and judgment normal.  Nursing note and vitals reviewed.   LABORATORY DATA:  I have reviewed the data as listed Lab Results  Component Value Date   WBC 6.4 12/12/2015   HGB 12.0 12/12/2015   HCT 37.6 12/12/2015   MCV 89.1 12/12/2015   PLT 289 12/12/2015   CMP     Component Value Date/Time   NA 140 12/12/2015 0920   K 4.0 12/12/2015 0920   CL 104 04/19/2014 0950   CO2 26 12/12/2015 0920   GLUCOSE 87 12/12/2015 0920   BUN 8.4 12/12/2015 0920   CREATININE 0.7 12/12/2015 0920   CALCIUM 10.5 (H) 12/12/2015 0920   PROT 9.3 (H) 12/12/2015 0920   ALBUMIN 3.2 (L) 12/12/2015 0920   AST 15 12/12/2015 0920   ALT 8 12/12/2015 0920   ALKPHOS 148 12/12/2015 0920   BILITOT 0.35 12/12/2015 0920   GFRNONAA >90 04/19/2014 0950   GFRAA >90 04/19/2014 0950   PATHOLOGY  RADIOGRAPHIC STUDIES: I have personally reviewed the radiological images as listed and agreed with the findings in the  report. No results found. Study Result   CLINICAL DATA:  Subsequent treatment strategy for cervical carcinoma.  EXAM: NUCLEAR MEDICINE PET SKULL BASE TO THIGH  TECHNIQUE: 5.0 mCi F-18 FDG was injected intravenously. Full-ring PET imaging was performed from the skull base to thigh after the radiotracer. CT data was obtained and used for attenuation correction and anatomic localization.  FASTING BLOOD GLUCOSE:  Value: 76 mg/dl  COMPARISON:  07/12/2014  FINDINGS: NECK  No hypermetabolic lymph nodes in the neck.  CHEST  New 1.2 cm right parasternal/internal mammary soft tissue density is seen which shows FDG uptake with SUV max of 7.0. No other hypermetabolic mediastinal or hilar lymph nodes identified. No suspicious pulmonary nodules on the CT scan.  ABDOMEN/PELVIS  Increased size of hypermetabolic mass with central cystic area is seen in the lateral dome of the right hepatic lobe. This currently measures 5.1 x 7.0 cm on image 120/4, compared to 1.6 x 1.1 cm previously. This has current SUV max of 20.7 compared to 5.3 previously . New small hypermetabolic low-attenuation lesions are now seen along capsular surface of the lateral right hepatic lobe. This is consistent with progressive liver metastases.  New FDG uptake is seen in the porta hepatis with SUV max of 5.0. This is suspicious for hypermetabolic lymphadenopathy which is difficult to measure or visualized in this region due to lack of IV contrast and paucity of intra-abdominal fat. No other hypermetabolic lymphadenopathy identified within the abdomen or pelvis.  Stable small uterine fibroids show no hypermetabolic activity. No residual hypermetabolic cervical mass identified.  New hypermetabolic activity is seen involving the rectum, which corresponds to diffuse rectal wall thickening seen on CT. This has an SUV max of 20.2. There is a irregular low-attenuation area seen between the anterior  rectal wall and posterior uterine wall which measures 2.5 x 4.9 cm on image 183/4. This shows surrounding hypermetabolic activity, and is suspicious for abscess.  SKELETON  No focal hypermetabolic activity to suggest skeletal metastasis. Increased diffuse bone marrow metabolic activity is seen, consistent with treatment response.  IMPRESSION: New small hypermetabolic right internal mammary soft tissue density, suspicious for metastatic lymphadenopathy.  Increased size and number of hypermetabolic liver metastases. New mild hypermetabolic activity in porta hepatis is suspicious for lymph node metastases.  No residual hypermetabolic cervical mass identified. Multiple uterine fibroids again seen without FDG uptake.  New diffuse rectal wall thickening with hypermetabolic activity, suspicious for proctitis which could be infectious in etiology or secondary to radiation changes ; recommend clinical correlation. Small pelvic fluid collection between the anterior rectal wall and posterior uterine wall, possibly due to abscess.   Electronically Signed   By: Earle Gell M.D.   On: 12/09/2015 17:20    ASSESSMENT & PLAN:  Recurrent cervical cancer ([place])   Staging form: Cervix Uteri, AJCC 7th Edition   - Clinical stage from 12/12/2015: FIGO Stage IVB (T2b, N1, M1) - Signed by Gordy Levan, MD on 12/12/2015 No problem-specific Assessment & Plan notes found for this encounter. Liver metastases Liver Biopsy on 10/27 with rare atypical cells Rectal wall thickening/hypermetabolism on PET Colonoscopy with Dr. Ladona Horns Unintentional Weight Loss Chemotherapy induced neuropathy Hx stage III cervical cancer  The patient had a rectal biopsy and colonoscopy performed with Dr. Tye Maryland at [facility]. I will request access to these pathology findings. Per the patient pathology was WNL.   Pathology from her liver  biopsy is reviewed. I discussed this with the patient. I have called Joylene John NP in [place], It may be reasonable to re-biopsy prior to initiation of chemotherapy but I will defer this to Dr. Denman George. We will tentatively schedule Erika [name] for chemotherapy the end of next week. I will touch base with Dr. Denman George prior.   The patient would like a referral to meet with nutrition. We will also get her a case of boost/ensure.   Prescription written for Emla cream, Zofran, Compazine, and Lortab. ([facility] [address])   The patient met with our patient navigator, Anderson Malta, today.   She will return for follow up cycle 1 day 1. She has minimal neuropathy from prior chemotherapy this will be monitored moving forward.  ORDERS PLACED FOR THIS ENCOUNTER: Orders Placed This Encounter  Procedures  . Ambulatory referral to Social Work    MEDICATIONS PRESCRIBED THIS ENCOUNTER: Meds ordered this encounter  Medications  . ondansetron (ZOFRAN) 4 MG tablet   Meds ordered this encounter  Medications  . DISCONTD: HYDROcodone-acetaminophen (NORCO/VICODIN) 5-325 MG tablet    Sig: Take 1 tablet by mouth every 6 (six) hours as needed for moderate pain.    Dispense:  45 tablet    Refill:  0  . DISCONTD: lidocaine-prilocaine (EMLA) cream    Sig: Apply to affected area once    Dispense:  30 g    Refill:  3  . DISCONTD: ondansetron (ZOFRAN) 8 MG tablet    Sig: Take 1 tablet (8 mg total) by mouth 2 (two) times daily as needed for refractory nausea / vomiting. Start on day 3 after chemo.    Dispense:  30 tablet    Refill:  1  . dexamethasone (DECADRON) 4 MG tablet    Sig: Take 2 tablets (8 mg total) by mouth daily. Start the day after chemotherapy for 2 days.    Dispense:  30 tablet    Refill:  1  . DISCONTD: prochlorperazine (COMPAZINE) 10 MG tablet    Sig: Take 1 tablet (10 mg total) by mouth every 6 (six) hours as needed (Nausea or vomiting).    Dispense:  30 tablet    Refill:  1  . ondansetron (ZOFRAN) 8 MG tablet    Sig: Take 1 tablet (8 mg total) by mouth 2  (two) times daily as needed for refractory nausea / vomiting. Start on day 3 after chemo.    Dispense:  30 tablet    Refill:  1  . prochlorperazine (COMPAZINE) 10 MG tablet    Sig: Take 1 tablet (10 mg total) by mouth every 6 (six) hours as needed (Nausea or vomiting).    Dispense:  30 tablet    Refill:  1  . lidocaine-prilocaine (EMLA) cream    Sig: Apply to affected area once    Dispense:  30 g    Refill:  3     All questions were answered. The patient knows to call the clinic with any problems, questions or concerns.  This document serves as a record of services personally performed by Ancil Linsey, MD. It was created on her behalf by Arlyce Harman, a trained medical scribe. The creation of this record is based on the scribe's personal observations and the provider's statements to them. This document has been checked and approved by the attending provider.  I have reviewed the above documentation for accuracy and completeness and I agree with the above.  This note was electronically signed.    Molli Hazard,  MD  12/23/2015 2:52 PM

## 2015-12-23 NOTE — Progress Notes (Signed)
START OFF PATHWAY REGIMEN - [Other Dx]  Carboplatin AUC=6 + Paclitaxel 175 mg/m2 + Bevacizumab 7.5 mg/kg q21 Days  [id number]:Carboplatin AUC=6 + Paclitaxel 175 mg/m2 + Bevacizumab 7.5 mg/kg q21 Days:   A cycle is every 21 days:     Paclitaxel (Taxol(R)) 175 mg/m2 in 500 mL NS IV over 3 hours day 1 Dose Mod: None     Carboplatin (Paraplatin(R)) AUC=6 in 250 mL NS IV over 1 hour day 1 Dose Mod: None     Bevacizumab (Avastin(R)) 7.5 mg/kg in 100 mL NS IV over 90 minutes first infusion, 60 minutes second infusion and 30 minutes all subsequent infusions if tolerated Dose Mod: None Additional Orders: * All AUC calculations intended to be used in [employer] formula  **Always confirm dose/schedule in your pharmacy ordering system**    Intent of Therapy: Non-Curative / Palliative Intent, Discussed with Patient

## 2015-12-23 NOTE — Patient Instructions (Signed)
[place] at [facility] Discharge Instructions  RECOMMENDATIONS MADE BY THE CONSULTANT AND ANY TEST RESULTS WILL BE SENT TO YOUR REFERRING PHYSICIAN.  Exam with Dr. Nuno Brubacher Muse. We will set you up for chemo and then see you in the office one week after chemo treatment.   We are going to refer you to our dietician.  Thank you for choosing [place] at [facility] to provide your oncology and hematology care.  To afford each patient quality time with our provider, please arrive at least 15 minutes before your scheduled appointment time.   Beginning January 23rd 2017 lab work for the [facility] will be done in the  Main lab at [facility] on 1st floor. If you have a lab appointment with the [facility] please come in thru the  Main Entrance and check in at the main information desk  You need to re-schedule your appointment should you arrive 10 or more minutes late.  We strive to give you quality time with our providers, and arriving late affects you and other patients whose appointments are after yours.  Also, if you no show three or more times for appointments you may be dismissed from the clinic at the providers discretion.     Again, thank you for choosing [facility].  Our hope is that these requests will decrease the amount of time that you wait before being seen by our physicians.       _____________________________________________________________  Should you have questions after your visit to [facility], please contact our office at [phone] between the hours of 8:30 a.m. and 4:30 p.m.  Voicemails left after 4:30 p.m. will not be returned until the following business day.  For prescription refill requests, have your pharmacy contact our office.         Resources For Cancer Patients and their Caregivers ? American Cancer Society: Can assist with transportation, wigs, general needs, runs Look Good Feel  Better.        (340)[phone] ? Cancer Care: Provides financial assistance, online support groups, medication/co-pay assistance.  1-800-813-HOPE 989-194-4249) ? [facility] Assists [place] cancer patients and their families through emotional , educational and financial support.  2232009995 ? [place] DSS Where to apply for food stamps, Medicaid and utility assistance. (361) 301-3700 ? [facility]: Transportation to medical appointments. 2492955177 ? Social Security Administration: May apply for disability if have a Stage IV cancer. 321-707-9214 (419)385-2103 ? [employer], Disability and Transit Services: Assists with nutrition, care and transit needs. [place] [place] Support Programs: @10RELATIVEDAYS @ > Cancer Support Group  2nd Tuesday of the month 1pm-2pm, Journey Room  > Creative Journey  3rd Tuesday of the month 1130am-1pm, Journey Room  > Look Good Feel Better  1st Wednesday of the month 10am-12 noon, Journey Room (Call [facility] to register [phone])

## 2015-12-29 ENCOUNTER — Other Ambulatory Visit: Payer: Self-pay | Admitting: Gynecologic Oncology

## 2015-12-29 ENCOUNTER — Encounter: Payer: Self-pay | Admitting: Gynecologic Oncology

## 2015-12-29 DIAGNOSIS — K769 Liver disease, unspecified: Secondary | ICD-10-CM

## 2015-12-29 NOTE — Progress Notes (Signed)
Gynecologic Oncology Multi-Disciplinary Disposition Conference Note  Date of the Conference: December 29, 2015  Patient Name: Erika [name]  Referring Provider: Dr. Evie Lacks Primary GYN Oncologist: Dr. 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  Disposition is to repeat biopsy of liver lesion.  If recurrence confirmed, plan for systemic therapy.   This Multidisciplinary conference took place involving physicians from [place], [place], Radiation Oncology, Pathology, Radiology along with the Gynecologic Oncology Nurse Practitioner and RN.  Comprehensive assessment of the patient's malignancy, staging, need for surgery, chemotherapy, radiation therapy, and need for further testing were reviewed. Supportive measures, both inpatient and following discharge were also discussed. The recommended plan of care is documented. Greater than 35 minutes were spent correlating and coordinating this patient's care.

## 2015-12-31 ENCOUNTER — Other Ambulatory Visit (HOSPITAL_COMMUNITY): Payer: Self-pay | Admitting: Hematology & Oncology

## 2015-12-31 ENCOUNTER — Ambulatory Visit (HOSPITAL_COMMUNITY): Payer: Medicaid - Out of State

## 2015-12-31 ENCOUNTER — Other Ambulatory Visit (HOSPITAL_COMMUNITY): Payer: Self-pay | Admitting: Pharmacist

## 2016-01-01 ENCOUNTER — Other Ambulatory Visit (HOSPITAL_COMMUNITY): Payer: Self-pay | Admitting: Pharmacist

## 2016-01-06 ENCOUNTER — Encounter: Payer: Self-pay | Admitting: *Deleted

## 2016-01-06 NOTE — Progress Notes (Signed)
[place] Psychosocial Distress Screening Clinical Social Work  Clinical Social Work was referred by distress screening protocol.  The patient scored a 5 on the Psychosocial Distress Thermometer which indicates moderate distress. Clinical Social Worker reviewed chart and phoned pt to assess for distress and other psychosocial needs. CSW phoned number provided in EPIC and there was no way to leave a message for pt. CSW will try again to make contact with pt and address her needs voiced in distress screen.   ONCBCN DISTRESS SCREENING 12/23/2015  Screening Type Initial Screening  Distress experienced in past week (1-10) 5  Practical problem type   Emotional problem type Nervousness/Anxiety;Adjusting to illness;Boredom;Adjusting to appearance changes  Spiritual/Religous concerns type Loss of sense of purpose  Physical Problem type Pain;Loss of appetitie;Sleep/insomnia  Physician notified of physical symptoms   Referral to clinical social work Yes  Referral to dietition Yes    Clinical Social Worker follow up needed: Yes.    If yes, follow up plan:  See above Loren Racer, [place] Tuesdays   [phone]

## 2016-01-07 ENCOUNTER — Other Ambulatory Visit (HOSPITAL_COMMUNITY): Payer: Medicaid - Out of State

## 2016-01-07 ENCOUNTER — Ambulatory Visit (HOSPITAL_COMMUNITY): Payer: Medicaid - Out of State | Admitting: Hematology & Oncology

## 2016-01-07 ENCOUNTER — Other Ambulatory Visit: Payer: Self-pay | Admitting: Radiology

## 2016-01-09 ENCOUNTER — Ambulatory Visit (HOSPITAL_COMMUNITY)
Admission: RE | Admit: 2016-01-09 | Discharge: 2016-01-09 | Disposition: A | Discharge status: Home / Self Care | Payer: Medicaid - Out of State | Source: Ambulatory Visit | Attending: Gynecologic Oncology | Admitting: Gynecologic Oncology

## 2016-01-09 ENCOUNTER — Encounter (HOSPITAL_COMMUNITY): Payer: Self-pay

## 2016-01-09 DIAGNOSIS — E876 Hypokalemia: Secondary | ICD-10-CM | POA: Diagnosis not present

## 2016-01-09 DIAGNOSIS — F419 Anxiety disorder, unspecified: Secondary | ICD-10-CM | POA: Insufficient documentation

## 2016-01-09 DIAGNOSIS — R109 Unspecified abdominal pain: Secondary | ICD-10-CM | POA: Insufficient documentation

## 2016-01-09 DIAGNOSIS — C539 Malignant neoplasm of cervix uteri, unspecified: Secondary | ICD-10-CM | POA: Insufficient documentation

## 2016-01-09 DIAGNOSIS — C787 Secondary malignant neoplasm of liver and intrahepatic bile duct: Secondary | ICD-10-CM | POA: Diagnosis not present

## 2016-01-09 DIAGNOSIS — D508 Other iron deficiency anemias: Secondary | ICD-10-CM | POA: Insufficient documentation

## 2016-01-09 DIAGNOSIS — Z87891 Personal history of nicotine dependence: Secondary | ICD-10-CM | POA: Diagnosis not present

## 2016-01-09 DIAGNOSIS — D259 Leiomyoma of uterus, unspecified: Secondary | ICD-10-CM | POA: Insufficient documentation

## 2016-01-09 DIAGNOSIS — K769 Liver disease, unspecified: Secondary | ICD-10-CM | POA: Insufficient documentation

## 2016-01-09 LAB — CBC
HEMATOCRIT: 30.9 % — AB (ref 36.0–46.0)
HEMOGLOBIN: 9.8 g/dL — AB (ref 12.0–15.0)
MCH: 28.2 pg (ref 26.0–34.0)
MCHC: 31.7 g/dL (ref 30.0–36.0)
MCV: 89 fL (ref 78.0–100.0)
Platelets: 207 10*3/uL (ref 150–400)
RBC: 3.47 MIL/uL — ABNORMAL LOW (ref 3.87–5.11)
RDW: 14.2 % (ref 11.5–15.5)
WBC: 5.9 10*3/uL (ref 4.0–10.5)

## 2016-01-09 LAB — APTT: aPTT: 30 seconds (ref 24–36)

## 2016-01-09 LAB — PROTIME-INR
INR: 1.04
Prothrombin Time: 13.6 seconds (ref 11.4–15.2)

## 2016-01-09 MED ORDER — FENTANYL CITRATE (PF) 100 MCG/2ML IJ SOLN
INTRAMUSCULAR | Status: AC
  Filled 2016-01-09: qty 2

## 2016-01-09 MED ORDER — SODIUM CHLORIDE 0.9% FLUSH
10.0000 mL | INTRAVENOUS | Status: AC | PRN
  Administered 2016-01-09: 10 mL

## 2016-01-09 MED ORDER — LIDOCAINE HCL (PF) 1 % IJ SOLN
INTRAMUSCULAR | Status: AC
  Filled 2016-01-09: qty 10

## 2016-01-09 MED ORDER — FENTANYL CITRATE (PF) 100 MCG/2ML IJ SOLN
INTRAMUSCULAR | Status: AC | PRN
  Administered 2016-01-09 (×2): 25 ug via INTRAVENOUS

## 2016-01-09 MED ORDER — MIDAZOLAM HCL 2 MG/2ML IJ SOLN
INTRAMUSCULAR | Status: AC | PRN
  Administered 2016-01-09: 1 mg via INTRAVENOUS
  Administered 2016-01-09 (×2): 0.5 mg via INTRAVENOUS

## 2016-01-09 MED ORDER — MIDAZOLAM HCL 2 MG/2ML IJ SOLN
INTRAMUSCULAR | Status: AC
  Filled 2016-01-09: qty 4

## 2016-01-09 MED ORDER — HEPARIN SOD (PORK) LOCK FLUSH 100 UNIT/ML IV SOLN
500.0000 [IU] | INTRAVENOUS | Status: AC | PRN
  Administered 2016-01-09: 500 [IU]
  Filled 2016-01-09: qty 5

## 2016-01-09 MED ORDER — SODIUM CHLORIDE 0.9 % IV SOLN
INTRAVENOUS | Status: DC
Start: 2016-01-09 — End: 2016-01-10
  Administered 2016-01-09: 13:00:00 via INTRAVENOUS

## 2016-01-09 MED ORDER — GELATIN ABSORBABLE 12-7 MM EX MISC
CUTANEOUS | Status: AC
  Filled 2016-01-09: qty 1

## 2016-01-09 NOTE — H&P (Signed)
Chief Complaint: Patient was seen in consultation today for liver lesion biopsy at the request of Cross,Melissa D  Referring Physician(s): Cross,Melissa D  Supervising Physician: Corrie Mckusick  Patient Status: [facility] - Out-pt  History of Present Illness: Erika [name] is a 40 y.o. 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 Increased size and number of hypermetabolic liver metastases. New mild hypermetabolic activity in porta hepatis is suspicious for lymph node metastases. No residual hypermetabolic cervical mass identified. Multiple uterine fibroids again seen without FDG uptake. New diffuse rectal wall thickening with hypermetabolic activity, suspicious for proctitis which could be infectious in etiology or secondary to radiation changes ; recommend clinical correlation. Small pelvic fluid collection between the anterior rectal wall and posterior uterine wall, possibly due to abscess.  Request now for biopsy of new liver lesion per Dr Livesay/Dr Denman George    Past Medical History:  Diagnosis Date  . Anxiety   . Cervical cancer ([place])   . Family history of adverse reaction to anesthesia    pt states her sister has to have the "reversal" after surgery   . Hypokalemia   . Microcytic hypochromic anemia     Past Surgical History:  Procedure Laterality Date  . CESAREAN SECTION    . port a cath placement      January 2016 / right   . TANDEM RING INSERTION  N/A 04/09/2014   Procedure: TANDEM RING PLACEMENT FOR HIGH DOSE RATE RADIATION THERAPY EXAM UNDER ANETHESIA PLACEMENT OF CERVICAL SLEEVE;  Surgeon: Gery Pray, MD;  Location: [facility];  Service: Urology;  Laterality: N/A;  . TANDEM RING INSERTION N/A 04/23/2014   Procedure: TANDEM RING PLACEMENT ;  Surgeon: Gery Pray, MD;  Location: [facility];  Service: Urology;  Laterality: N/A;  . TANDEM RING INSERTION N/A 04/30/2014   Procedure: TANDEM RING PLACEMENT,EXAM UNDER ANESTHESIA ;  Surgeon: Gery Pray, MD;  Location: [facility];  Service: Urology;  Laterality: N/A;  . TANDEM RING INSERTION N/A 05/07/2014   Procedure: TANDEM RING PLACEMENT;  Surgeon: Gery Pray, MD;  Location: [facility];  Service: Urology;  Laterality: N/A;  . TANDEM RING INSERTION N/A 05/17/2014   Procedure: TANDEM RING INSERTION;  Surgeon: Gery Pray, MD;  Location: [facility];  Service: Urology;  Laterality: N/A;  . TUBAL LIGATION      Allergies: Patient has no known allergies.  Medications: Prior to Admission medications   Medication Sig Start Date End Date Taking? Authorizing Provider  calcium-vitamin D (OSCAL 500/200 D-3) 500-200 MG-UNIT per tablet Take 1 tablet by mouth 2 (two) times daily. 06/10/14  Yes Everitt Amber, MD  estrogen, conjugated,-medroxyprogesterone (PREMPRO) 0.3-1.5 MG per tablet Take 1 tablet by mouth daily. 06/10/14  Yes Everitt Amber, MD  ferrous sulfate 325 (65 FE) MG tablet Take 325 mg by mouth daily with breakfast.   Yes Historical Provider, MD  lidocaine-prilocaine (EMLA) cream Apply to affected area once Patient taking differently: Apply 1 application topically daily as needed. Apply to affected area once 12/23/15  Yes Patrici Ranks, MD  LORazepam (ATIVAN) 1 MG  tablet TAKE 1 TABLET BY MOUTH AT BEDTIME AS NEEDED FOR ANXIETY /INSOMNIA 09/23/15  Yes Historical Provider, MD  naproxen sodium (ANAPROX) 220 MG tablet Take 220 mg by mouth 4 (four) times daily as needed.   Yes Historical Provider, MD    ondansetron (ZOFRAN) 8 MG tablet Take 1 tablet (8 mg total) by mouth 2 (two) times daily as needed for refractory nausea / vomiting. Start on day 3 after chemo. 12/23/15  Yes Patrici Ranks, MD  PACLITAXEL IV Inject into the vein.   Yes Historical Provider, MD  BEVACIZUMAB IV Inject into the vein.    Historical Provider, MD  CARBOPLATIN IV Inject into the vein.    Historical Provider, MD  dexamethasone (DECADRON) 4 MG tablet Take 2 tablets (8 mg total) by mouth daily. Start the day after chemotherapy for 2 days. 12/23/15   Patrici Ranks, MD  HYDROcodone-acetaminophen (NORCO/VICODIN) 5-325 MG tablet Take 1 tablet by mouth every 6 (six) hours as needed for moderate pain. 12/23/15   Patrici Ranks, MD  prochlorperazine (COMPAZINE) 10 MG tablet Take 1 tablet (10 mg total) by mouth every 6 (six) hours as needed (Nausea or vomiting). 12/23/15   Patrici Ranks, MD     History reviewed. No pertinent family history.  Social History   Social History  . Marital status: Single    Spouse name: N/A  . Number of children: 2  . Years of education: N/A   Occupational History  . cook at [employer] History Main Topics  . Smoking status: Former Smoker    Packs/day: 0.50    Years: 6.00    Types: Cigarettes    Quit date: 08/02/2011  . Smokeless tobacco: Never Used  . Alcohol use No  . Drug use:     Types: Marijuana  . Sexual activity: Yes   Other Topics Concern  . None   Social History Narrative  . None    Review of Systems: A 12 point ROS discussed and pertinent positives are indicated in the HPI above.  All other systems are negative.  Review of Systems  Constitutional: Positive for activity change, appetite change, fatigue and unexpected weight change. Negative for fever.  Respiratory: Negative for shortness of breath.   Cardiovascular: Negative for chest pain.  Gastrointestinal: Positive for abdominal pain.  Musculoskeletal: Negative for back pain and gait problem.   Psychiatric/Behavioral: Negative for behavioral problems and confusion.    Vital Signs: BP (!) 84/74   Pulse 98   Temp 97.7 F (36.5 C)   Resp 16   Ht 5\' 1"  (1.549 m)   Wt 91 lb (41.3 kg)   SpO2 100%   BMI 17.19 kg/m   Physical Exam  Constitutional: She is oriented to person, place, and time.  Cardiovascular: Normal rate and regular rhythm.   Pulmonary/Chest: Effort normal and breath sounds normal.  Abdominal: Soft. Bowel sounds are normal.  Musculoskeletal: Normal range of motion.  Neurological: She is alert and oriented to person, place, and time.  Skin: Skin is warm and dry.  Psychiatric: She has a normal mood and affect. Her behavior is normal. Judgment and thought content normal.  Nursing note and vitals reviewed.   Mallampati Score:  MD Evaluation Airway: WNL Heart: WNL Abdomen: WNL Chest/ Lungs: WNL ASA  Classification: 3 Mallampati/Airway Score: One  Imaging: No results found.  Labs:  CBC:  Recent Labs  11/26/15 1103 12/12/15 0920  WBC 6.2 6.4  HGB 11.6* 12.0  HCT  36.8 37.6  PLT 270 289    COAGS:  Recent Labs  11/26/15 1103  INR 0.94  APTT 29    BMP:  Recent Labs  12/12/15 0920  NA 140  K 4.0  CO2 26  GLUCOSE 87  BUN 8.4  CALCIUM 10.5*  CREATININE 0.7    LIVER FUNCTION TESTS:  Recent Labs  12/12/15 0920  BILITOT 0.35  AST 15  ALT 8  ALKPHOS 148  PROT 9.3*  ALBUMIN 3.2*    TUMOR MARKERS: No results for input(s): AFPTM, CEA, CA199, CHROMGRNA in the last 8760 hours.  Assessment and Plan:  Cervical Ca with recurrence Liver metastasis Liver lesion aspiration 11/26/15 was suspicious for Sq cell carcinoma New +PET liver lesion Now for biopsy for tissue diagnosis Risks and Benefits discussed with the patient including, but not limited to bleeding, infection, damage to adjacent structures or low yield requiring additional tests. All of the patient's questions were answered, patient is agreeable to proceed. Consent  signed and in chart.   Thank you for this interesting consult.  I greatly enjoyed meeting Erika [name] and look forward to participating in their care.  A copy of this report was sent to the requesting provider on this date.  Electronically Signed: Monia Sabal A 01/09/2016, 12:44 PM   I spent a total of  30 Minutes   in face to face in clinical consultation, greater than 50% of which was counseling/coordinating care for liver lesion bx

## 2016-01-09 NOTE — Discharge Instructions (Signed)
Liver Biopsy, Care After °Introduction °These instructions give you information on caring for yourself after your procedure. Your doctor may also give you more specific instructions. Call your doctor if you have any problems or questions after your procedure. °Follow these instructions at home: °· Rest at home for 1-2 days or as told by your doctor. °· Have someone stay with you for at least 24 hours. °· Do not do these things in the first 24 hours: °¨ Drive. °¨ Use machinery. °¨ Take care of other people. °¨ Sign legal documents. °¨ Take a bath or shower. °· There are many different ways to close and cover a cut (incision). For example, a cut can be closed with stitches, skin glue, or adhesive strips. Follow your doctor's instructions on: °¨ Taking care of your cut. °¨ Changing and removing your bandage (dressing). °¨ Removing whatever was used to close your cut. °· Do not drink alcohol in the first week. °· Do not lift more than 5 pounds or play contact sports for the first 2 weeks. °· Take medicines only as told by your doctor. For 1 week, do not take medicine that has aspirin in it or medicines like ibuprofen. °· Get your test results. °Contact a doctor if: °· A cut bleeds and leaves more than just a small spot of blood. °· A cut is red, puffs up (swells), or hurts more than before. °· Fluid or something else comes from a cut. °· A cut smells bad. °· You have a fever or chills. °Get help right away if: °· You have swelling, bloating, or pain in your belly (abdomen). °· You get dizzy or faint. °· You have a rash. °· You feel sick to your stomach (nauseous) or throw up (vomit). °· You have trouble breathing, feel short of breath, or feel faint. °· Your chest hurts. °· You have problems talking or seeing. °· You have trouble balancing or moving your arms or legs. °This information is not intended to replace advice given to you by your health care provider. Make sure you discuss any questions you have with your  health care provider. °Document Released: 10/28/2007 Document Revised: 06/26/2015 Document Reviewed: 03/16/2013 °© 2017 Elsevier ° °

## 2016-01-09 NOTE — Procedures (Signed)
Interventional Radiology Procedure Note  Procedure: US biopsy of right liver mass..  Complications: None Recommendations:  - 2 hour obs - Do not submerge for 7 days - Routine care - follow up pathology   Signed,  Dulcy Fanny. Earleen Newport, DO

## 2016-01-15 ENCOUNTER — Other Ambulatory Visit (HOSPITAL_COMMUNITY): Payer: Self-pay | Admitting: Emergency Medicine

## 2016-01-15 NOTE — Progress Notes (Signed)
Called pt to tell her that her pathology results had come back squamous cell carcinoma.  Dr Whitney Muse would like to go ahead and get treatment started.  She already had an appt on 01/21/2016.  I asked if she wanted to start on that day or if she wanted me to move it up.  She said the 01/21/2016 would be good.  Dr Whitney Muse has spoken with Skip about pts insurance issue and she is still to be treated here.

## 2016-01-21 ENCOUNTER — Encounter (HOSPITAL_BASED_OUTPATIENT_CLINIC_OR_DEPARTMENT_OTHER): Payer: Medicaid - Out of State | Admitting: Oncology

## 2016-01-21 ENCOUNTER — Encounter (HOSPITAL_COMMUNITY): Payer: Medicaid - Out of State | Attending: Hematology & Oncology

## 2016-01-21 ENCOUNTER — Encounter (HOSPITAL_COMMUNITY): Payer: Self-pay | Admitting: Oncology

## 2016-01-21 VITALS — BP 103/61 | HR 70 | Temp 98.4°F | Resp 16

## 2016-01-21 DIAGNOSIS — Z5112 Encounter for antineoplastic immunotherapy: Secondary | ICD-10-CM | POA: Diagnosis not present

## 2016-01-21 DIAGNOSIS — Z5189 Encounter for other specified aftercare: Secondary | ICD-10-CM | POA: Diagnosis not present

## 2016-01-21 DIAGNOSIS — C787 Secondary malignant neoplasm of liver and intrahepatic bile duct: Secondary | ICD-10-CM

## 2016-01-21 DIAGNOSIS — Z5111 Encounter for antineoplastic chemotherapy: Secondary | ICD-10-CM | POA: Diagnosis not present

## 2016-01-21 DIAGNOSIS — C539 Malignant neoplasm of cervix uteri, unspecified: Secondary | ICD-10-CM | POA: Diagnosis present

## 2016-01-21 LAB — CBC WITH DIFFERENTIAL/PLATELET
Basophils Absolute: 0 10*3/uL (ref 0.0–0.1)
Basophils Relative: 0 %
Eosinophils Absolute: 0 10*3/uL (ref 0.0–0.7)
Eosinophils Relative: 0 %
HEMATOCRIT: 31.1 % — AB (ref 36.0–46.0)
HEMOGLOBIN: 9.8 g/dL — AB (ref 12.0–15.0)
LYMPHS ABS: 0.9 10*3/uL (ref 0.7–4.0)
Lymphocytes Relative: 14 %
MCH: 29 pg (ref 26.0–34.0)
MCHC: 31.5 g/dL (ref 30.0–36.0)
MCV: 92 fL (ref 78.0–100.0)
MONOS PCT: 11 %
Monocytes Absolute: 0.7 10*3/uL (ref 0.1–1.0)
NEUTROS ABS: 4.8 10*3/uL (ref 1.7–7.7)
NEUTROS PCT: 75 %
Platelets: 271 10*3/uL (ref 150–400)
RBC: 3.38 MIL/uL — AB (ref 3.87–5.11)
RDW: 14.5 % (ref 11.5–15.5)
WBC: 6.5 10*3/uL (ref 4.0–10.5)

## 2016-01-21 LAB — COMPREHENSIVE METABOLIC PANEL
ALK PHOS: 93 U/L (ref 38–126)
ALT: 8 U/L — AB (ref 14–54)
ANION GAP: 7 (ref 5–15)
AST: 16 U/L (ref 15–41)
Albumin: 2.9 g/dL — ABNORMAL LOW (ref 3.5–5.0)
BILIRUBIN TOTAL: 0.3 mg/dL (ref 0.3–1.2)
BUN: 7 mg/dL (ref 6–20)
CO2: 29 mmol/L (ref 22–32)
CREATININE: 0.59 mg/dL (ref 0.44–1.00)
Calcium: 9.3 mg/dL (ref 8.9–10.3)
Chloride: 102 mmol/L (ref 101–111)
GFR calc non Af Amer: >60 mL/min (ref >60)
Glucose, Bld: 85 mg/dL (ref 65–99)
Potassium: 3.8 mmol/L (ref 3.5–5.1)
SODIUM: 138 mmol/L (ref 135–145)
TOTAL PROTEIN: 7.2 g/dL (ref 6.5–8.1)

## 2016-01-21 LAB — URINALYSIS, DIPSTICK ONLY
GLUCOSE, UA: NEGATIVE mg/dL
Hgb urine dipstick: NEGATIVE
KETONES UR: NEGATIVE mg/dL
LEUKOCYTES UA: NEGATIVE
NITRITE: NEGATIVE
PH: 7 (ref 5.0–8.0)
PROTEIN: NEGATIVE mg/dL
Specific Gravity, Urine: 1.01 (ref 1.005–1.030)

## 2016-01-21 MED ORDER — EPINEPHRINE PF 1 MG/ML IJ SOLN: 0.5000 mg | Freq: Once | INTRAMUSCULAR | Status: DC | PRN

## 2016-01-21 MED ORDER — SODIUM CHLORIDE 0.9 % IV SOLN
14.5000 mg/kg | Freq: Once | INTRAVENOUS | Status: AC
  Administered 2016-01-21: 600 mg via INTRAVENOUS
  Filled 2016-01-21: qty 4

## 2016-01-21 MED ORDER — SODIUM CHLORIDE 0.9 % IV SOLN
Freq: Once | INTRAVENOUS | Status: AC
  Administered 2016-01-21: 10:00:00 via INTRAVENOUS

## 2016-01-21 MED ORDER — DIPHENHYDRAMINE HCL 50 MG/ML IJ SOLN: 25.0000 mg | Freq: Once | INTRAMUSCULAR | Status: DC | PRN

## 2016-01-21 MED ORDER — ALBUTEROL SULFATE (2.5 MG/3ML) 0.083% IN NEBU: 2.5000 mg | INHALATION_SOLUTION | Freq: Once | RESPIRATORY_TRACT | Status: DC | PRN

## 2016-01-21 MED ORDER — SODIUM CHLORIDE 0.9 % IV SOLN
20.0000 mg | Freq: Once | INTRAVENOUS | Status: AC
  Administered 2016-01-21: 20 mg via INTRAVENOUS
  Filled 2016-01-21: qty 2

## 2016-01-21 MED ORDER — DIPHENHYDRAMINE HCL 50 MG/ML IJ SOLN
50.0000 mg | Freq: Once | INTRAMUSCULAR | Status: AC
  Administered 2016-01-21: 50 mg via INTRAVENOUS
  Filled 2016-01-21: qty 1

## 2016-01-21 MED ORDER — PACLITAXEL CHEMO INJECTION 300 MG/50ML
175.0000 mg/m2 | Freq: Once | INTRAVENOUS | Status: AC
  Administered 2016-01-21: 234 mg via INTRAVENOUS
  Filled 2016-01-21: qty 39

## 2016-01-21 MED ORDER — SODIUM CHLORIDE 0.9% FLUSH: 10.0000 mL | INTRAVENOUS | Status: DC | PRN

## 2016-01-21 MED ORDER — FAMOTIDINE IN NACL 20-0.9 MG/50ML-% IV SOLN: 20.0000 mg | Freq: Once | INTRAVENOUS | Status: DC | PRN

## 2016-01-21 MED ORDER — HEPARIN SOD (PORK) LOCK FLUSH 100 UNIT/ML IV SOLN
500.0000 [IU] | Freq: Once | INTRAVENOUS | Status: AC | PRN
  Administered 2016-01-21: 500 [IU]

## 2016-01-21 MED ORDER — DIPHENHYDRAMINE HCL 50 MG/ML IJ SOLN: 50.0000 mg | Freq: Once | INTRAMUSCULAR | Status: DC | PRN

## 2016-01-21 MED ORDER — COLD PACK MISC ONCOLOGY: 1.0000 | Freq: Once | Status: AC | PRN

## 2016-01-21 MED ORDER — METHYLPREDNISOLONE SODIUM SUCC 125 MG IJ SOLR
125.0000 mg | Freq: Once | INTRAMUSCULAR | Status: DC | PRN
Start: 2016-01-21 — End: 2016-01-22

## 2016-01-21 MED ORDER — PALONOSETRON HCL INJECTION 0.25 MG/5ML
0.2500 mg | Freq: Once | INTRAVENOUS | Status: AC
  Administered 2016-01-21: 0.25 mg via INTRAVENOUS
  Filled 2016-01-21: qty 5

## 2016-01-21 MED ORDER — HYDROCODONE-ACETAMINOPHEN 5-325 MG PO TABS: 1.0000 | ORAL_TABLET | Freq: Four times a day (QID) | ORAL | 0 refills | Status: DC | PRN

## 2016-01-21 MED ORDER — CARBOPLATIN CHEMO INJECTION 600 MG/60ML
436.0000 mg | Freq: Once | INTRAVENOUS | Status: AC
  Administered 2016-01-21: 440 mg via INTRAVENOUS
  Filled 2016-01-21: qty 44

## 2016-01-21 MED ORDER — EPINEPHRINE PF 1 MG/10ML IJ SOSY: 0.2500 mg | PREFILLED_SYRINGE | Freq: Once | INTRAMUSCULAR | Status: DC | PRN

## 2016-01-21 MED ORDER — FAMOTIDINE IN NACL 20-0.9 MG/50ML-% IV SOLN
20.0000 mg | Freq: Once | INTRAVENOUS | Status: AC
  Administered 2016-01-21: 20 mg via INTRAVENOUS
  Filled 2016-01-21: qty 50

## 2016-01-21 MED ORDER — SODIUM CHLORIDE 0.9 % IV SOLN: Freq: Once | INTRAVENOUS | Status: DC | PRN

## 2016-01-21 MED ORDER — PEGFILGRASTIM 6 MG/0.6ML ~~LOC~~ PSKT
6.0000 mg | PREFILLED_SYRINGE | Freq: Once | SUBCUTANEOUS | Status: AC
  Administered 2016-01-21: 6 mg via SUBCUTANEOUS
  Filled 2016-01-21: qty 0.6

## 2016-01-21 NOTE — Progress Notes (Signed)
Erika Partridge, MD [place] [address]  Recurrent cervical cancer [facility]) - Plan: CBC with Differential, Comprehensive metabolic panel, HYDROcodone-acetaminophen (NORCO/VICODIN) 5-325 MG tablet  CURRENT THERAPY: Carboplatin/Paclitaxel/Avastin beginning on 01/21/2016.  INTERVAL HISTORY: Erika [name] 40 y.o. female returns for followup of metastatic squamous cell carcinoma of cervix.    Recurrent cervical cancer (Erika [name])   02/16/2014 Procedure    Cervical biopsy by Dr. Denman [name]      02/18/2014 Pathology Results    Cervix, biopsy - SQUAMOUS CELL CARCINOMA.      03/07/2014 - 04/24/2014 Radiation Therapy    Dr. Sondra [name]- vaginal brachytherapy, 45 Gy to pelvis with sidewall boost 9 Gy and HDR 28.5 Gy in 5 fractions      03/08/2014 - 04/03/2014 Chemotherapy    Weekly CDDP 40 mg/m2 by Dr. 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      07/12/2014 PET scan    Marked interval decrease in size and hypermetabolism of the cervical mass.  The bilateral hypermetabolic pelvic sidewall lymphadenopathy has resolved in the interval.  Areas of hypermetabolic brown fat bilaterally in the chest and abdomen.      11/05/2015 Imaging    MRI abdomen at [facility]- 7.0 x 5.3 cm are in left hepatic lobe with central 3.1 x 2.7 x 2.3 cm complex cystic area, as well as an apparent subcapsular foci      11/26/2015 Procedure    US aspiration of left hepatic lobe- yielding 13 cc of yellow purulent fluid (pain resolved and PO intake increased)      11/26/2015 Pathology Results    [id number] highly suspicious for squamous cell carcinoma      12/09/2015 PET scan    New small hypermetabolic right internal mammary soft tissue density, suspicious for metastatic lymphadenopathy.  Increased size and number of hypermetabolic liver metastases. New mild hypermetabolic activity in porta hepatis is suspicious  for lymph node metastases.  No residual hypermetabolic cervical mass identified. Multiple uterine fibroids again seen without FDG uptake.  New diffuse rectal wall thickening with hypermetabolic activity, suspicious for proctitis which could be infectious in etiology or secondary to radiation changes ; recommend clinical correlation. Small pelvic fluid collection between the anterior rectal wall and posterior uterine wall, possibly due to abscess.      12/12/2015 Initial Diagnosis    Recurrent cervical cancer (Erika [name])     01/02/2016 Procedure    Port placed by Dr. Ladona [name]      01/09/2016 Procedure    US biopsy      01/12/2016 Pathology Results    Diagnosis Liver, needle/core biopsy - METASTATIC SQUAMOUS CELL CARCINOMA.      01/21/2016 -  Chemotherapy    The patient had palonosetron (ALOXI) injection 0.25 mg, 0.25 mg, Intravenous,  Once, 0 of 4 cycles  bevacizumab (AVASTIN) 625 mg in sodium chloride 0.9 % 100 mL chemo infusion, 15 mg/kg = 625 mg (100 % of original dose 15 mg/kg), Intravenous,  Once, 0 of 4 cycles Dose modification: 15 mg/kg (original dose 15 mg/kg, Cycle 1, Reason: Provider Judgment)  CARBOplatin (PARAPLATIN) in sodium chloride 0.9 % 100 mL chemo infusion,  (original dose ), Intravenous,  Once, 0 of 4 cycles Dose modification:   (Cycle 1)  PACLitaxel (TAXOL) 234 mg in dextrose 5 % 250 mL chemo infusion (> 80mg /m2), 175 mg/m2 = 234 mg, Intravenous,  Once, 0 of 4  cycles  for chemotherapy treatment.         She denies any complaints today.  She has had chemotherapy teaching.  She is educated on the proposed treatment plan.  I briefly reviewed some of the more common and unique side effects associated with her treatment plan.  All of her questions are answered today.  She denies any oncology complaints.  Review of Systems  Constitutional: Negative.  Negative for chills and fever.  HENT: Negative.   Eyes: Negative.   Respiratory: Negative.  Negative for  cough.   Cardiovascular: Negative.  Negative for chest pain.  Gastrointestinal: Negative.  Negative for constipation, diarrhea, nausea and vomiting.  Genitourinary: Negative.  Negative for dysuria.  Musculoskeletal: Negative.  Negative for falls.  Skin: Negative.   Neurological: Negative.  Negative for weakness.  Endo/Heme/Allergies: Negative.   Psychiatric/Behavioral: Negative.     Past Medical History:  Diagnosis Date  . Anxiety   . Cervical cancer ([place])   . Cervical cancer, FIGO stage IIB ([place]) 04/09/2014  . Family history of adverse reaction to anesthesia    pt states her sister has to have the "reversal" after surgery   . Hypokalemia   . Microcytic hypochromic anemia   . Recurrent cervical cancer ([place]) 12/12/2015    Past Surgical History:  Procedure Laterality Date  . CESAREAN SECTION    . port a cath placement      January 2016 / right   . TANDEM RING INSERTION N/A 04/09/2014   Procedure: TANDEM RING PLACEMENT FOR HIGH DOSE RATE RADIATION THERAPY EXAM UNDER ANETHESIA PLACEMENT OF CERVICAL SLEEVE;  Surgeon: Gery Pray, MD;  Location: [facility];  Service: Urology;  Laterality: N/A;  . TANDEM RING INSERTION N/A 04/23/2014   Procedure: TANDEM RING PLACEMENT ;  Surgeon: Gery Pray, MD;  Location: [facility];  Service: Urology;  Laterality: N/A;  . TANDEM RING INSERTION N/A 04/30/2014   Procedure: TANDEM RING PLACEMENT,EXAM UNDER ANESTHESIA ;  Surgeon: Gery Pray, MD;  Location: [facility];  Service: Urology;  Laterality: N/A;  . TANDEM RING INSERTION N/A 05/07/2014   Procedure: TANDEM RING PLACEMENT;  Surgeon: Gery Pray, MD;  Location: [facility];  Service: Urology;  Laterality: N/A;  . TANDEM RING INSERTION N/A 05/17/2014   Procedure: TANDEM RING INSERTION;  Surgeon: Gery Pray, MD;  Location: [facility];  Service: Urology;  Laterality: N/A;  . TUBAL LIGATION      History reviewed. No pertinent family history.  Social History   Social History  . Marital status:  Single    Spouse name: N/A  . Number of children: 2  . Years of education: N/A   Occupational History  . cook at [employer] History Main Topics  . Smoking status: Former Smoker    Packs/day: 0.50    Years: 6.00    Types: Cigarettes    Quit date: 08/02/2011  . Smokeless tobacco: Never Used  . Alcohol use No  . Drug use:     Types: Marijuana  . Sexual activity: Yes   Other Topics Concern  . None   Social History Narrative  . None     PHYSICAL EXAMINATION  ECOG PERFORMANCE STATUS: 0 - Asymptomatic  Vitals:   01/21/16 0931  BP: (!) 100/45  Pulse: 94  Resp: 16  Temp: 98.5 F (36.9 C)    GENERAL:alert, no distress, cachectic, comfortable, smiling and unaccompanied  SKIN: skin color, texture, turgor are normal, no rashes or significant lesions HEAD: Normocephalic, No  masses, lesions, tenderness or abnormalities EYES: normal, EOMI, Conjunctiva are pink and non-injected EARS: External ears normal OROPHARYNX:lips, buccal mucosa, and tongue normal and mucous membranes are moist  NECK: supple, trachea midline LYMPH:  not examined BREAST:not examined LUNGS: clear to auscultation  HEART: regular rate & rhythm ABDOMEN:abdomen soft and normal bowel sounds BACK: Back symmetric, no curvature. EXTREMITIES:less then 2 second capillary refill  NEURO: alert & oriented x 3 with fluent speech, no focal motor/sensory deficits, gait normal   LABORATORY DATA: CBC    Component Value Date/Time   WBC 5.9 01/09/2016 1253   RBC 3.47 (L) 01/09/2016 1253   HGB 9.8 (L) 01/09/2016 1253   HGB 12.0 12/12/2015 0920   HCT 30.9 (L) 01/09/2016 1253   HCT 37.6 12/12/2015 0920   PLT 207 01/09/2016 1253   PLT 289 12/12/2015 0920   MCV 89.0 01/09/2016 1253   MCV 89.1 12/12/2015 0920   MCH 28.2 01/09/2016 1253   MCHC 31.7 01/09/2016 1253   RDW 14.2 01/09/2016 1253   RDW 15.5 (H) 12/12/2015 0920   LYMPHSABS 1.0 12/12/2015 0920   MONOABS 0.3 12/12/2015 0920   EOSABS 0.0 12/12/2015  0920   BASOSABS 0.0 12/12/2015 0920      Chemistry      Component Value Date/Time   NA 140 12/12/2015 0920   K 4.0 12/12/2015 0920   CL 104 04/19/2014 0950   CO2 26 12/12/2015 0920   BUN 8.4 12/12/2015 0920   CREATININE 0.7 12/12/2015 0920      Component Value Date/Time   CALCIUM 10.5 (H) 12/12/2015 0920   ALKPHOS 148 12/12/2015 0920   AST 15 12/12/2015 0920   ALT 8 12/12/2015 0920   BILITOT 0.35 12/12/2015 0920        PENDING LABS:   RADIOGRAPHIC STUDIES:  US Biopsy  Result Date: 01/09/2016 INDICATION: 40 year old female with a history of gynecologic malignancy and a PET avid lesion in the liver. EXAM: ULTRASOUND BIOPSY CORE LIVER MEDICATIONS: None. ANESTHESIA/SEDATION: Moderate (conscious) sedation was employed during this procedure. A total of Versed 2.0 mg and Fentanyl 50 mcg was administered intravenously. Moderate Sedation Time: 13 minutes. The patient's level of consciousness and vital signs were monitored continuously by radiology nursing throughout the procedure under my direct supervision. FLUOROSCOPY TIME:  None COMPLICATIONS: None PROCEDURE: Informed written consent was obtained from the patient after a thorough discussion of the procedural risks, benefits and alternatives. All questions were addressed. Maximal Sterile Barrier Technique was utilized including caps, mask, sterile gowns, sterile gloves, sterile drape, hand hygiene and skin antiseptic. A timeout was performed prior to the initiation of the procedure. Patient positioned supine position on the ultrasound table. Ultrasound survey of the upper abdomen was performed with images stored and sent to PACs. The patient is an prepped and draped in usual sterile fashion. The skin and subcutaneous tissues were generously infiltrated 1% lidocaine for local anesthesia. Using ultrasound guidance, 17 gauge guide needle was advanced into the liver mass of the right liver. Stylet was removed and multiple 18 gauge core biopsy  were acquired. Single Gel-Foam pledget was infused. Patient tolerated the procedure well and remained hemodynamically stable throughout. No complications were encountered and no significant blood loss encountered. IMPRESSION: Status post ultrasound-guided biopsy of right liver mass with tissue specimen sent to pathology for complete histopathologic analysis. Signed, Dulcy Fanny. Earleen Newport, DO [facility] Radiology Electronically Signed   By: Corrie Mckusick D.O.   On: 01/09/2016 15:14     PATHOLOGY:  ASSESSMENT AND PLAN:  Recurrent cervical cancer ([place]) Metastatic squamous cell carcinoma of cervix, S/P US guided biopsy of liver lesion confirming Stage IV disease.  On systemic chemotherapy consisting of Carboplatin/Paclitaxel/Avastin on 01/21/2016.  Oncology history developed.  Pre-treatment labs today: CBC diff, CMET, Urine dipstick.  I personally reviewed and went over laboratory results with the patient.  The results are noted within this dictation.  I have reviewed some common side effects of treatment and symptom management of these.  I reviewed side effects/toxicicites that require her to contact for further direction or management.  I have refilled her pain medication.  Return as scheduled for Nadir check with labs on 02/05/2015.   ORDERS PLACED FOR THIS ENCOUNTER: Orders Placed This Encounter  Procedures  . CBC with Differential  . Comprehensive metabolic panel    MEDICATIONS PRESCRIBED THIS ENCOUNTER: Meds ordered this encounter  Medications  . HYDROcodone-acetaminophen (NORCO/VICODIN) 5-325 MG tablet    Sig: Take 1 tablet by mouth every 6 (six) hours as needed for moderate pain.    Dispense:  45 tablet    Refill:  0    Order Specific Question:   Supervising Provider    Answer:   Patrici Ranks [id number]    THERAPY PLAN:  Carboplatin/Paclitaxel/Avastin  All questions were answered. The patient knows to call the clinic with  any problems, questions or concerns. We can certainly see the patient much sooner if necessary.  Patient and plan discussed with Dr. Ancil Linsey and she is in agreement with the aforementioned.   This note is electronically signed by: Robynn Pane, PA-C 01/21/2016 10:03 AM

## 2016-01-21 NOTE — Patient Instructions (Addendum)
[place] at [facility] Discharge Instructions  RECOMMENDATIONS MADE BY THE CONSULTANT AND ANY TEST RESULTS WILL BE SENT TO YOUR REFERRING PHYSICIAN.  You were seen today by Kirby Crigler PA-C. Treatment today. Follow up as scheduled on 1/4.    Thank you for choosing [place] at [facility] to provide your oncology and hematology care.  To afford each patient quality time with our provider, please arrive at least 15 minutes before your scheduled appointment time.   Beginning January 23rd 2017 lab work for the [facility] will be done in the  Main lab at [facility] on 1st floor. If you have a lab appointment with the [facility] please come in thru the  Main Entrance and check in at the main information desk  You need to re-schedule your appointment should you arrive 10 or more minutes late.  We strive to give you quality time with our providers, and arriving late affects you and other patients whose appointments are after yours.  Also, if you no show three or more times for appointments you may be dismissed from the clinic at the providers discretion.     Again, thank you for choosing [facility].  Our hope is that these requests will decrease the amount of time that you wait before being seen by our physicians.       _____________________________________________________________  Should you have questions after your visit to [facility], please contact our office at [phone] between the hours of 8:30 a.m. and 4:30 p.m.  Voicemails left after 4:30 p.m. will not be returned until the following business day.  For prescription refill requests, have your pharmacy contact our office.         Resources For Cancer Patients and their Caregivers ? American Cancer Society: Can assist with transportation, wigs, general needs, runs Look Good Feel Better.        (713) 245-3972 ? Cancer Care: Provides financial  assistance, online support groups, medication/co-pay assistance.  1-800-813-HOPE 4082740903) ? [place] Assists [place] cancer patients and their families through emotional , educational and financial support.  718-458-6081 ? [place] DSS Where to apply for food stamps, Medicaid and utility assistance. 579-487-0582 ? [facility]: Transportation to medical appointments. 253-853-7375 ? Social Security Administration: May apply for disability if have a Stage IV cancer. 437-788-0433 253 738 9172 ? [employer], Disability and Transit Services: Assists with nutrition, care and transit needs. [place] Support Programs: @10RELATIVEDAYS @ > Cancer Support Group  2nd Tuesday of the month 1pm-2pm, Journey Room  > Creative Journey  3rd Tuesday of the month 1130am-1pm, Journey Room  > Look Good Feel Better  1st Wednesday of the month 10am-12 noon, Journey Room (Call [place] to register [phone])

## 2016-01-21 NOTE — Progress Notes (Signed)
Okay to tx today prior to having lab results per T. Kefalas, PA-C.   Tolerated tx w/o adverse reaction.  Alert, in no distress.  VSS.  Discharged ambulatory.

## 2016-01-21 NOTE — Addendum Note (Signed)
Addended by: [facility] on: 01/21/2016 01:21 PM   Modules accepted: Orders

## 2016-01-21 NOTE — Assessment & Plan Note (Addendum)
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  On systemic chemotherapy consisting of Carboplatin/Paclitaxel/Avastin on 01/21/2016.  Oncology history developed.  Pre-treatment labs today: CBC diff, CMET, Urine dipstick.  I personally reviewed and went over laboratory results with the patient.  The results are noted within this dictation.  I have reviewed some common side effects of treatment and symptom management of these.  I reviewed side effects/toxicicites that require her to contact for further direction or management.  I have refilled her pain medication.  Return as scheduled for Nadir check with labs on 02/05/2015.

## 2016-01-22 ENCOUNTER — Telehealth (HOSPITAL_COMMUNITY): Payer: Self-pay | Admitting: *Deleted

## 2016-01-22 NOTE — Telephone Encounter (Signed)
Pt contacted for 24 h f/u s/p 1st tx with Taxol/Carbo.  Denies any s/s and states she is feeling "fine".  Instructed to call the clinic with my questions or concerns.

## 2016-01-24 ENCOUNTER — Encounter (HOSPITAL_COMMUNITY): Payer: Self-pay | Admitting: Hematology & Oncology

## 2016-01-28 ENCOUNTER — Ambulatory Visit (HOSPITAL_COMMUNITY): Payer: Medicaid - Out of State

## 2016-02-05 ENCOUNTER — Ambulatory Visit (HOSPITAL_COMMUNITY)
Admission: RE | Admit: 2016-02-05 | Discharge: 2016-02-05 | Disposition: A | Discharge status: Home / Self Care | Payer: Medicaid - Out of State | Source: Ambulatory Visit | Attending: Oncology | Admitting: Oncology

## 2016-02-05 ENCOUNTER — Other Ambulatory Visit (HOSPITAL_COMMUNITY): Payer: PRIVATE HEALTH INSURANCE

## 2016-02-05 ENCOUNTER — Encounter (HOSPITAL_COMMUNITY): Payer: Medicaid - Out of State | Attending: Hematology & Oncology | Admitting: Oncology

## 2016-02-05 ENCOUNTER — Encounter (HOSPITAL_COMMUNITY): Payer: Self-pay | Admitting: Oncology

## 2016-02-05 VITALS — BP 108/68 | HR 100 | Temp 97.7°F | Resp 14 | Wt 85.9 lb

## 2016-02-05 DIAGNOSIS — R1030 Lower abdominal pain, unspecified: Secondary | ICD-10-CM | POA: Diagnosis not present

## 2016-02-05 DIAGNOSIS — M545 Low back pain, unspecified: Secondary | ICD-10-CM

## 2016-02-05 DIAGNOSIS — C539 Malignant neoplasm of cervix uteri, unspecified: Secondary | ICD-10-CM | POA: Insufficient documentation

## 2016-02-05 DIAGNOSIS — C787 Secondary malignant neoplasm of liver and intrahepatic bile duct: Secondary | ICD-10-CM | POA: Diagnosis present

## 2016-02-05 LAB — URINALYSIS, ROUTINE W REFLEX MICROSCOPIC
Glucose, UA: NEGATIVE mg/dL
KETONES UR: NEGATIVE mg/dL
Leukocytes, UA: NEGATIVE
NITRITE: NEGATIVE
PH: 5.5 (ref 5.0–8.0)

## 2016-02-05 LAB — CBC WITH DIFFERENTIAL/PLATELET
Basophils Absolute: 0 10*3/uL (ref 0.0–0.1)
Basophils Relative: 0 %
EOS PCT: 0 %
Eosinophils Absolute: 0 10*3/uL (ref 0.0–0.7)
HEMATOCRIT: 35.5 % — AB (ref 36.0–46.0)
Hemoglobin: 11 g/dL — ABNORMAL LOW (ref 12.0–15.0)
LYMPHS ABS: 0.9 10*3/uL (ref 0.7–4.0)
LYMPHS PCT: 11 %
MCH: 29.3 pg (ref 26.0–34.0)
MCHC: 31 g/dL (ref 30.0–36.0)
MCV: 94.7 fL (ref 78.0–100.0)
MONO ABS: 0.6 10*3/uL (ref 0.1–1.0)
Monocytes Relative: 7 %
NEUTROS ABS: 6.9 10*3/uL (ref 1.7–7.7)
Neutrophils Relative %: 82 %
PLATELETS: 175 10*3/uL (ref 150–400)
RBC: 3.75 MIL/uL — AB (ref 3.87–5.11)
RDW: 16.9 % — ABNORMAL HIGH (ref 11.5–15.5)
WBC: 8.5 10*3/uL (ref 4.0–10.5)

## 2016-02-05 LAB — COMPREHENSIVE METABOLIC PANEL
ALT: 10 U/L — AB (ref 14–54)
AST: 16 U/L (ref 15–41)
Albumin: 3.5 g/dL (ref 3.5–5.0)
Alkaline Phosphatase: 122 U/L (ref 38–126)
Anion gap: 8 (ref 5–15)
BILIRUBIN TOTAL: 0.5 mg/dL (ref 0.3–1.2)
BUN: 12 mg/dL (ref 6–20)
CALCIUM: 9.6 mg/dL (ref 8.9–10.3)
CHLORIDE: 99 mmol/L — AB (ref 101–111)
CO2: 27 mmol/L (ref 22–32)
CREATININE: 0.62 mg/dL (ref 0.44–1.00)
Glucose, Bld: 85 mg/dL (ref 65–99)
Potassium: 4.3 mmol/L (ref 3.5–5.1)
Sodium: 134 mmol/L — ABNORMAL LOW (ref 135–145)
TOTAL PROTEIN: 8.4 g/dL — AB (ref 6.5–8.1)

## 2016-02-05 LAB — URINALYSIS, MICROSCOPIC (REFLEX)
Bacteria, UA: NONE SEEN
WBC UA: NONE SEEN WBC/hpf (ref 0–5)

## 2016-02-05 MED ORDER — SODIUM CHLORIDE 0.9% FLUSH
10.0000 mL | INTRAVENOUS | Status: DC | PRN
Start: 2016-02-05 — End: 2016-02-05
  Administered 2016-02-05: 10 mL via INTRAVENOUS
  Filled 2016-02-05: qty 10

## 2016-02-05 MED ORDER — HEPARIN SOD (PORK) LOCK FLUSH 100 UNIT/ML IV SOLN
500.0000 [IU] | Freq: Once | INTRAVENOUS | Status: AC
  Administered 2016-02-05: 500 [IU] via INTRAVENOUS

## 2016-02-05 NOTE — Assessment & Plan Note (Addendum)
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  On systemic chemotherapy consisting of Carboplatin/Paclitaxel/Avastin on 01/21/2016.  Oncology history updated.  NADIR labs today: CBC diff, CMET.  I personally reviewed and went over laboratory results with the patient.  The results are noted within this dictation.  She reports low abdominal pain radiating to her low back.  I have ordered a UA with reflex and urine culture.  She denies any specific urinary complaints.  She is having some issues with constipation that has been a longstanding issue for her.  She will be given a constipation sheet.  Order is placed for an abdominal xray to evaluate stool burden.  Weight is down 4 lbs.  I gave her a sample of Boost Optimum (chocolate).  This was our last one on site.  I have messaged nutritionist regarding her weight loss and to see if we can assist with ascertaining boost for her.  She is advised to use her Zofran q 8 hours PRN as this is effective for her nausea.  She is advised to use her Hydrocodone as prescribed for her abdominal pain, knowing that it too can cause constipation.  She is fearful of her pain medication.  She can take 1/2 tablet instead of a whole tablet if effective.  Return as scheduled for follow-up and cycle #2 on 02/11/2015.

## 2016-02-05 NOTE — Progress Notes (Signed)
No PCP Per Patient No address on file  Recurrent cervical cancer ([place]) - Plan: CBC with Differential, Comprehensive metabolic panel, heparin lock flush 100 unit/mL, sodium chloride flush (NS) 0.9 % injection 10 mL  Acute bilateral low back pain without sciatica - Plan: Urinalysis, Routine w reflex microscopic, Urine culture, Urinalysis, Routine w reflex microscopic, Urine culture  Lower abdominal pain - Plan: DG Abd 2 Views  CURRENT THERAPY: Carboplatin/Paclitaxel/Avastin beginning on 01/21/2016.  INTERVAL HISTORY: Erika [name] 41 y.o. female returns for followup of metastatic squamous cell carcinoma of cervix.    Recurrent cervical cancer ([place])   02/16/2014 Procedure    Cervical biopsy by Dr. Denman George      02/18/2014 Pathology Results    Cervix, biopsy - SQUAMOUS CELL CARCINOMA.      03/07/2014 - 04/24/2014 Radiation Therapy    Dr. Sondra Come- vaginal brachytherapy, 45 Gy to pelvis with sidewall boost 9 Gy and HDR 28.5 Gy in 5 fractions      03/08/2014 - 04/03/2014 Chemotherapy    Weekly CDDP 40 mg/m2 by Dr. 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      07/12/2014 PET scan    Marked interval decrease in size and hypermetabolism of the cervical mass.  The bilateral hypermetabolic pelvic sidewall lymphadenopathy has resolved in the interval.  Areas of hypermetabolic brown fat bilaterally in the chest and abdomen.      11/05/2015 Imaging    MRI abdomen at [place]- 7.0 x 5.3 cm are in left hepatic lobe with central 3.1 x 2.7 x 2.3 cm complex cystic area, as well as an apparent subcapsular foci      11/26/2015 Procedure    US aspiration of left hepatic lobe- yielding 13 cc of yellow purulent fluid (pain resolved and PO intake increased)      11/26/2015 Pathology Results    [id number]- highly suspicious for squamous cell carcinoma      12/09/2015 PET scan    New small hypermetabolic right  internal mammary soft tissue density, suspicious for metastatic lymphadenopathy.  Increased size and number of hypermetabolic liver metastases. New mild hypermetabolic activity in porta hepatis is suspicious for lymph node metastases.  No residual hypermetabolic cervical mass identified. Multiple uterine fibroids again seen without FDG uptake.  New diffuse rectal wall thickening with hypermetabolic activity, suspicious for proctitis which could be infectious in etiology or secondary to radiation changes ; recommend clinical correlation. Small pelvic fluid collection between the anterior rectal wall and posterior uterine wall, possibly due to abscess.      12/12/2015 Initial Diagnosis    Recurrent cervical cancer ([place])     01/02/2016 Procedure    Port placed by Dr. Ladona Horns      01/09/2016 Procedure    US biopsy      01/12/2016 Pathology Results    Diagnosis Liver, needle/core biopsy - METASTATIC SQUAMOUS CELL CARCINOMA.      01/21/2016 -  Chemotherapy    The patient had palonosetron (ALOXI) injection 0.25 mg, 0.25 mg, Intravenous,  Once, 0 of 4 cycles  bevacizumab (AVASTIN) 625 mg in sodium chloride 0.9 % 100 mL chemo infusion, 15 mg/kg = 625 mg (100 % of original dose 15 mg/kg), Intravenous,  Once, 0 of 4 cycles Dose modification: 15 mg/kg (original dose 15 mg/kg, Cycle 1, Reason: Provider Judgment)  CARBOplatin (PARAPLATIN) in sodium chloride 0.9 % 100 mL chemo infusion,  (original dose ),  Intravenous,  Once, 0 of 4 cycles Dose modification:   (Cycle 1)  PACLitaxel (TAXOL) 234 mg in dextrose 5 % 250 mL chemo infusion (> 80mg /m2), 175 mg/m2 = 234 mg, Intravenous,  Once, 0 of 4 cycles  for chemotherapy treatment.         She is here today for NADIR check.  She tolerated treatment well until Saturday AM.  She notes that she woke up with nausea.  She reports 2 episodes of emesis.  She admits that Zofran is effective, but she tries to wait and see if the sickness  passes before using her home antiemetic.  She is advised to take her Zofran at the first sign of nausea.  Also over the weekend, she had issues with constipation.  She notes that she is using a stool softener and reports improvement in her bowels.  She had a good BM this AM.  Additionally, over the weekend, she developed low abdominal pelvic pain.  It comes and goes but is there more times than not.  She notes that it starts in "my private area" and radiates to her low abdomen and low back.  She denies any vaginal bleeding or spotting. She is fearful to use her pain medication as she knows too many people who are addicted.  She does use 1/2 hydrocodone which is effective and actually improves her appetite.  She notes that she waits all day and suffers with the pain until she is in tears before taking her pain medication.  She is advised to not wait to use her pain medication.  Her abdominal pain is worsened when exposed to cold weather.  Her weight is down 4 lbs.  She notes that her appetite was good until this weekend when she started to have the aforementioned issues.  Last night, she took 1/2 hydrocodone and went out for dinner and had a good meal.  She reports that she ate most of her meal which is unusual for her.  She is using Boost Optimum, but this is expensive for her.    Review of Systems  Constitutional: Positive for weight loss. Negative for chills and fever.  HENT: Negative.   Eyes: Negative.   Respiratory: Negative.  Negative for cough and hemoptysis.   Cardiovascular: Negative.  Negative for chest pain.  Gastrointestinal: Positive for abdominal pain, constipation, nausea and vomiting. Negative for diarrhea.  Genitourinary: Negative.   Musculoskeletal: Negative.   Skin: Negative.   Neurological: Negative.   Psychiatric/Behavioral: Negative.     Past Medical History:  Diagnosis Date  . Anxiety   . Cervical cancer ([place])   . Cervical cancer, FIGO stage IIB ([place]) 04/09/2014  .  Family history of adverse reaction to anesthesia    pt states her sister has to have the "reversal" after surgery   . Hypokalemia   . Microcytic hypochromic anemia   . Recurrent cervical cancer ([place]) 12/12/2015    Past Surgical History:  Procedure Laterality Date  . CESAREAN SECTION    . port a cath placement      January 2016 / right   . TANDEM RING INSERTION N/A 04/09/2014   Procedure: TANDEM RING PLACEMENT FOR HIGH DOSE RATE RADIATION THERAPY EXAM UNDER ANETHESIA PLACEMENT OF CERVICAL SLEEVE;  Surgeon: Gery Pray, MD;  Location: [facility];  Service: Urology;  Laterality: N/A;  . TANDEM RING INSERTION N/A 04/23/2014   Procedure: TANDEM RING PLACEMENT ;  Surgeon: Gery Pray, MD;  Location: [facility];  Service: Urology;  Laterality: N/A;  .  TANDEM RING INSERTION N/A 04/30/2014   Procedure: TANDEM RING PLACEMENT,EXAM UNDER ANESTHESIA ;  Surgeon: Gery Pray, MD;  Location: [facility];  Service: Urology;  Laterality: N/A;  . TANDEM RING INSERTION N/A 05/07/2014   Procedure: TANDEM RING PLACEMENT;  Surgeon: Gery Pray, MD;  Location: [facility];  Service: Urology;  Laterality: N/A;  . TANDEM RING INSERTION N/A 05/17/2014   Procedure: TANDEM RING INSERTION;  Surgeon: Gery Pray, MD;  Location: [facility];  Service: Urology;  Laterality: N/A;  . TUBAL LIGATION      History reviewed. No pertinent family history.  Social History   Social History  . Marital status: Single    Spouse name: N/A  . Number of children: 2  . Years of education: N/A   Occupational History  . cook at [employer] History Main Topics  . Smoking status: Former Smoker    Packs/day: 0.50    Years: 6.00    Types: Cigarettes    Quit date: 08/02/2011  . Smokeless tobacco: Never Used  . Alcohol use No  . Drug use:     Types: Marijuana  . Sexual activity: Yes   Other Topics Concern  . None   Social History Narrative  . None     PHYSICAL EXAMINATION  ECOG PERFORMANCE STATUS: 0 -  Asymptomatic  Vitals:   02/05/16 0831  BP: 108/68  Pulse: 100  Resp: 14  Temp: 97.7 F (36.5 C)    GENERAL:alert, no distress, cachectic, comfortable, smiling and accompanied by her husband, Leroy Sea. SKIN: skin color, texture, turgor are normal, no rashes or significant lesions HEAD: Normocephalic, No masses, lesions, tenderness or abnormalities EYES: normal, EOMI, Conjunctiva are pink and non-injected EARS: External ears normal OROPHARYNX:lips, buccal mucosa, and tongue normal and mucous membranes are moist  NECK: supple, trachea midline LYMPH:  not examined BREAST:not examined LUNGS: clear to auscultation and percussion HEART: regular rate & rhythm without murmur, rub, or gallop.  Normal S1 and S2.   ABDOMEN:abdomen soft and normal bowel sounds.  Minimal tenderness is epigastric region. BACK: Back symmetric, no curvature. No CVA tenderness EXTREMITIES:less then 2 second capillary refill  NEURO: alert & oriented x 3 with fluent speech, no focal motor/sensory deficits, gait normal   LABORATORY DATA: CBC    Component Value Date/Time   WBC 8.5 02/05/2016 0921   RBC 3.75 (L) 02/05/2016 0921   HGB 11.0 (L) 02/05/2016 0921   HGB 12.0 12/12/2015 0920   HCT 35.5 (L) 02/05/2016 0921   HCT 37.6 12/12/2015 0920   PLT 175 02/05/2016 0921   PLT 289 12/12/2015 0920   MCV 94.7 02/05/2016 0921   MCV 89.1 12/12/2015 0920   MCH 29.3 02/05/2016 0921   MCHC 31.0 02/05/2016 0921   RDW 16.9 (H) 02/05/2016 0921   RDW 15.5 (H) 12/12/2015 0920   LYMPHSABS 0.9 02/05/2016 0921   LYMPHSABS 1.0 12/12/2015 0920   MONOABS 0.6 02/05/2016 0921   MONOABS 0.3 12/12/2015 0920   EOSABS 0.0 02/05/2016 0921   EOSABS 0.0 12/12/2015 0920   BASOSABS 0.0 02/05/2016 0921   BASOSABS 0.0 12/12/2015 0920      Chemistry      Component Value Date/Time   NA 134 (L) 02/05/2016 0921   NA 140 12/12/2015 0920   K 4.3 02/05/2016 0921   K 4.0 12/12/2015 0920   CL 99 (L) 02/05/2016 0921   CO2 27 02/05/2016 0921    CO2 26 12/12/2015 0920   BUN 12 02/05/2016 [id number]  BUN 8.4 12/12/2015 0920   CREATININE 0.62 02/05/2016 0921   CREATININE 0.7 12/12/2015 0920      Component Value Date/Time   CALCIUM 9.6 02/05/2016 0921   CALCIUM 10.5 (H) 12/12/2015 0920   ALKPHOS 122 02/05/2016 0921   ALKPHOS 148 12/12/2015 0920   AST 16 02/05/2016 0921   AST 15 12/12/2015 0920   ALT 10 (L) 02/05/2016 0921   ALT 8 12/12/2015 0920   BILITOT 0.5 02/05/2016 0921   BILITOT 0.35 12/12/2015 0920      PENDING LABS:   RADIOGRAPHIC STUDIES:  US Biopsy  Result Date: 01/09/2016 INDICATION: 41 year old female with a history of gynecologic malignancy and a PET avid lesion in the liver. EXAM: ULTRASOUND BIOPSY CORE LIVER MEDICATIONS: None. ANESTHESIA/SEDATION: Moderate (conscious) sedation was employed during this procedure. A total of Versed 2.0 mg and Fentanyl 50 mcg was administered intravenously. Moderate Sedation Time: 13 minutes. The patient's level of consciousness and vital signs were monitored continuously by radiology nursing throughout the procedure under my direct supervision. FLUOROSCOPY TIME:  None COMPLICATIONS: None PROCEDURE: Informed written consent was obtained from the patient after a thorough discussion of the procedural risks, benefits and alternatives. All questions were addressed. Maximal Sterile Barrier Technique was utilized including caps, mask, sterile gowns, sterile gloves, sterile drape, hand hygiene and skin antiseptic. A timeout was performed prior to the initiation of the procedure. Patient positioned supine position on the ultrasound table. Ultrasound survey of the upper abdomen was performed with images stored and sent to PACs. The patient is an prepped and draped in usual sterile fashion. The skin and subcutaneous tissues were generously infiltrated 1% lidocaine for local anesthesia. Using ultrasound guidance, 17 gauge guide needle was advanced into the liver mass of the right liver. Stylet was  removed and multiple 18 gauge core biopsy were acquired. Single Gel-Foam pledget was infused. Patient tolerated the procedure well and remained hemodynamically stable throughout. No complications were encountered and no significant blood loss encountered. IMPRESSION: Status post ultrasound-guided biopsy of right liver mass with tissue specimen sent to pathology for complete histopathologic analysis. Signed, Dulcy Fanny. Earleen Newport, DO [facility] Radiology Electronically Signed   By: Corrie Mckusick D.O.   On: 01/09/2016 15:14   Dg Abd 2 Views  Result Date: 02/05/2016 CLINICAL DATA:  Low abdomen pain and pressure for several days, currently the patient is on chemotherapy for cervical carcinoma EXAM: ABDOMEN - 2 VIEW COMPARISON:  None. FINDINGS: There is a moderate to large amount of feces throughout the colon. No bowel obstruction is seen. No free air is noted on the erect view. No opaque calculi are noted. The bones are unremarkable. IMPRESSION: 1. Moderately large amount of feces throughout the colon. 2. No bowel obstruction or free air . Electronically Signed   By: Ivar Drape M.D.   On: 02/05/2016 10:26     PATHOLOGY:    ASSESSMENT AND PLAN:  Recurrent cervical cancer (H[facility]) Metastatic squamous cell carcinoma of cervix, S/P US guided biopsy of liver lesion confirming Stage IV disease.  On systemic chemotherapy consisting of Carboplatin/Paclitaxel/Avastin on 01/21/2016.  Oncology history updated.  NADIR labs today: CBC diff, CMET.  I personally reviewed and went over laboratory results with the patient.  The results are noted within this dictation.  She reports low abdominal pain radiating to her low back.  I have ordered a UA with reflex and urine culture.  She denies any specific urinary complaints.  She is having some issues with constipation that  has been a longstanding issue for her.  She will be given a constipation sheet.  Order is placed for an  abdominal xray to evaluate stool burden.  Weight is down 4 lbs.  I gave her a sample of Boost Optimum (chocolate).  This was our last one on site.  I have messaged nutritionist regarding her weight loss and to see if we can assist with ascertaining boost for her.  She is advised to use her Zofran q 8 hours PRN as this is effective for her nausea.  She is advised to use her Hydrocodone as prescribed for her abdominal pain, knowing that it too can cause constipation.  She is fearful of her pain medication.  She can take 1/2 tablet instead of a whole tablet if effective.  Return as scheduled for follow-up and cycle #2 on 02/11/2015.   ORDERS PLACED FOR THIS ENCOUNTER: Orders Placed This Encounter  Procedures  . Urine culture  . DG Abd 2 Views  . Urinalysis, Routine w reflex microscopic  . Urinalysis, Microscopic (reflex)    MEDICATIONS PRESCRIBED THIS ENCOUNTER: Meds ordered this encounter  Medications  . heparin lock flush 100 unit/mL  . sodium chloride flush (NS) 0.9 % injection 10 mL    THERAPY PLAN:  Carboplatin/Paclitaxel/Avastin  All questions were answered. The patient knows to call the clinic with any problems, questions or concerns. We can certainly see the patient much sooner if necessary.  Patient and plan discussed with Dr. Ancil Linsey and she is in agreement with the aforementioned.   This note is electronically signed by: Doy Mince 02/05/2016 10:49 AM

## 2016-02-05 NOTE — Patient Instructions (Signed)
[place] at [facility] Discharge Instructions  RECOMMENDATIONS MADE BY THE CONSULTANT AND ANY TEST RESULTS WILL BE SENT TO YOUR REFERRING PHYSICIAN.  Labs today  UA today  Abdominal XRay today  Take your Zofran every 8 hours for nausea  Use your Hydrocodone! Even if you only take 1/2 tablet every 6 hours - make sure you take it!  Constipation sheet given  Call [facility] with any problems - this is the only way we can help  Thank you for choosing [place] at [facility] to provide your oncology and hematology care.  To afford each patient quality time with our provider, please arrive at least 15 minutes before your scheduled appointment time.    If you have a lab appointment with the [facility] please come in thru the  Main Entrance and check in at the main information desk  You need to re-schedule your appointment should you arrive 10 or more minutes late.  We strive to give you quality time with our providers, and arriving late affects you and other patients whose appointments are after yours.  Also, if you no show three or more times for appointments you may be dismissed from the clinic at the providers discretion.     Again, thank you for choosing [facility].  Our hope is that these requests will decrease the amount of time that you wait before being seen by our physicians.       _____________________________________________________________  Should you have questions after your visit to [facility], please contact our office at [phone] between the hours of 8:30 a.m. and 4:30 p.m.  Voicemails left after 4:30 p.m. will not be returned until the following business day.  For prescription refill requests, have your pharmacy contact our office.       Resources For Cancer Patients and their Caregivers ? American Cancer Society: Can assist with transportation, wigs, general needs, runs Look Good Feel  Better.        872 082 7830 ? Cancer Care: Provides financial assistance, online support groups, medication/co-pay assistance.  1-800-813-HOPE 613 019 0403) ? [place] Assists [place] cancer patients and their families through emotional , educational and financial support.  (825)526-8470 ? [place] DSS Where to apply for food stamps, Medicaid and utility assistance. 780 477 0334 ? RCATS: Transportation to medical appointments. 539-643-4818 ? Social Security Administration: May apply for disability if have a Stage IV cancer. 651 817 9989 902-321-1687 ? [employer], Disability and Transit Services: Assists with nutrition, care and transit needs. [place] Support Programs: @10RELATIVEDAYS @ > Cancer Support Group  2nd Tuesday of the month 1pm-2pm, Journey Room  > Creative Journey  3rd Tuesday of the month 1130am-1pm, Journey Room  > Look Good Feel Better  1st Wednesday of the month 10am-12 noon, Journey Room (Call [place] to register [phone])

## 2016-02-05 NOTE — Progress Notes (Signed)
Erika [name] presented for Portacath access and flush. Portacath located right chest wall accessed with  H 20 needle. Good blood return present. Portacath flushed with 2ml NS and 500U/49ml Heparin and needle removed intact. Procedure without incident. Patient tolerated procedure well. Labs drawn per orders

## 2016-02-07 LAB — URINE CULTURE: CULTURE: NO GROWTH

## 2016-02-09 ENCOUNTER — Other Ambulatory Visit (HOSPITAL_COMMUNITY): Payer: Self-pay | Admitting: Emergency Medicine

## 2016-02-10 ENCOUNTER — Other Ambulatory Visit (HOSPITAL_COMMUNITY): Payer: Self-pay | Admitting: Hematology & Oncology

## 2016-02-11 ENCOUNTER — Encounter (HOSPITAL_COMMUNITY): Payer: Self-pay | Admitting: Hematology & Oncology

## 2016-02-11 ENCOUNTER — Encounter (HOSPITAL_BASED_OUTPATIENT_CLINIC_OR_DEPARTMENT_OTHER): Payer: Medicaid - Out of State | Admitting: Hematology & Oncology

## 2016-02-11 ENCOUNTER — Encounter (HOSPITAL_BASED_OUTPATIENT_CLINIC_OR_DEPARTMENT_OTHER): Payer: Medicaid - Out of State

## 2016-02-11 VITALS — BP 110/59 | HR 85 | Temp 97.6°F | Resp 16 | Wt 87.4 lb

## 2016-02-11 VITALS — BP 100/60 | HR 78 | Temp 98.0°F | Resp 18

## 2016-02-11 DIAGNOSIS — C539 Malignant neoplasm of cervix uteri, unspecified: Secondary | ICD-10-CM

## 2016-02-11 DIAGNOSIS — C787 Secondary malignant neoplasm of liver and intrahepatic bile duct: Secondary | ICD-10-CM

## 2016-02-11 DIAGNOSIS — K5903 Drug induced constipation: Secondary | ICD-10-CM

## 2016-02-11 DIAGNOSIS — R1011 Right upper quadrant pain: Secondary | ICD-10-CM

## 2016-02-11 DIAGNOSIS — D500 Iron deficiency anemia secondary to blood loss (chronic): Secondary | ICD-10-CM

## 2016-02-11 DIAGNOSIS — Z5112 Encounter for antineoplastic immunotherapy: Secondary | ICD-10-CM | POA: Diagnosis not present

## 2016-02-11 DIAGNOSIS — Z5111 Encounter for antineoplastic chemotherapy: Secondary | ICD-10-CM | POA: Diagnosis not present

## 2016-02-11 DIAGNOSIS — T451X5A Adverse effect of antineoplastic and immunosuppressive drugs, initial encounter: Secondary | ICD-10-CM

## 2016-02-11 DIAGNOSIS — G620 Drug-induced polyneuropathy: Secondary | ICD-10-CM | POA: Diagnosis not present

## 2016-02-11 LAB — CBC WITH DIFFERENTIAL/PLATELET
BASOS PCT: 0 %
Basophils Absolute: 0 10*3/uL (ref 0.0–0.1)
EOS ABS: 0 10*3/uL (ref 0.0–0.7)
EOS PCT: 0 %
HCT: 33.3 % — ABNORMAL LOW (ref 36.0–46.0)
HEMOGLOBIN: 10.7 g/dL — AB (ref 12.0–15.0)
Lymphocytes Relative: 11 %
Lymphs Abs: 0.9 10*3/uL (ref 0.7–4.0)
MCH: 30.1 pg (ref 26.0–34.0)
MCHC: 32.1 g/dL (ref 30.0–36.0)
MCV: 93.8 fL (ref 78.0–100.0)
MONO ABS: 0.6 10*3/uL (ref 0.1–1.0)
MONOS PCT: 7 %
NEUTROS PCT: 82 %
Neutro Abs: 6.2 10*3/uL (ref 1.7–7.7)
PLATELETS: 259 10*3/uL (ref 150–400)
RBC: 3.55 MIL/uL — ABNORMAL LOW (ref 3.87–5.11)
RDW: 17.3 % — AB (ref 11.5–15.5)
WBC: 7.7 10*3/uL (ref 4.0–10.5)

## 2016-02-11 LAB — COMPREHENSIVE METABOLIC PANEL
ALBUMIN: 3.3 g/dL — AB (ref 3.5–5.0)
ALT: 10 U/L — ABNORMAL LOW (ref 14–54)
ANION GAP: 8 (ref 5–15)
AST: 16 U/L (ref 15–41)
Alkaline Phosphatase: 104 U/L (ref 38–126)
BUN: 9 mg/dL (ref 6–20)
CO2: 29 mmol/L (ref 22–32)
Calcium: 9.1 mg/dL (ref 8.9–10.3)
Chloride: 99 mmol/L — ABNORMAL LOW (ref 101–111)
Creatinine, Ser: 0.67 mg/dL (ref 0.44–1.00)
GFR calc Af Amer: >60 mL/min (ref >60)
GFR calc non Af Amer: >60 mL/min (ref >60)
GLUCOSE: 98 mg/dL (ref 65–99)
POTASSIUM: 3.9 mmol/L (ref 3.5–5.1)
SODIUM: 136 mmol/L (ref 135–145)
TOTAL PROTEIN: 7.7 g/dL (ref 6.5–8.1)
Total Bilirubin: 0.3 mg/dL (ref 0.3–1.2)

## 2016-02-11 LAB — URINALYSIS, DIPSTICK ONLY
Bilirubin Urine: NEGATIVE
GLUCOSE, UA: NEGATIVE mg/dL
Hgb urine dipstick: NEGATIVE
Ketones, ur: NEGATIVE mg/dL
LEUKOCYTES UA: NEGATIVE
NITRITE: NEGATIVE
PH: 7 (ref 5.0–8.0)
PROTEIN: NEGATIVE mg/dL
Specific Gravity, Urine: 1.01 (ref 1.005–1.030)

## 2016-02-11 MED ORDER — HYDROCODONE-ACETAMINOPHEN 5-325 MG PO TABS
1.0000 | ORAL_TABLET | Freq: Four times a day (QID) | ORAL | 0 refills | Status: DC | PRN
Start: 2016-02-11 — End: 2016-03-03

## 2016-02-11 MED ORDER — HEPARIN SOD (PORK) LOCK FLUSH 100 UNIT/ML IV SOLN
500.0000 [IU] | Freq: Once | INTRAVENOUS | Status: AC | PRN
  Administered 2016-02-11: 500 [IU]
  Filled 2016-02-11 (×2): qty 5

## 2016-02-11 MED ORDER — PEGFILGRASTIM 6 MG/0.6ML ~~LOC~~ PSKT
6.0000 mg | PREFILLED_SYRINGE | Freq: Once | SUBCUTANEOUS | Status: AC
  Administered 2016-02-11: 6 mg via SUBCUTANEOUS
  Filled 2016-02-11: qty 0.6

## 2016-02-11 MED ORDER — DEXAMETHASONE SODIUM PHOSPHATE 100 MG/10ML IJ SOLN
20.0000 mg | Freq: Once | INTRAMUSCULAR | Status: AC
  Administered 2016-02-11: 20 mg via INTRAVENOUS
  Filled 2016-02-11: qty 2

## 2016-02-11 MED ORDER — SODIUM CHLORIDE 0.9 % IV SOLN
Freq: Once | INTRAVENOUS | Status: AC
  Administered 2016-02-11: 11:00:00 via INTRAVENOUS

## 2016-02-11 MED ORDER — PALONOSETRON HCL INJECTION 0.25 MG/5ML
0.2500 mg | Freq: Once | INTRAVENOUS | Status: AC
  Administered 2016-02-11: 0.25 mg via INTRAVENOUS
  Filled 2016-02-11: qty 5

## 2016-02-11 MED ORDER — SODIUM CHLORIDE 0.9% FLUSH: 10.0000 mL | INTRAVENOUS | Status: DC | PRN

## 2016-02-11 MED ORDER — FAMOTIDINE IN NACL 20-0.9 MG/50ML-% IV SOLN
20.0000 mg | Freq: Once | INTRAVENOUS | Status: AC
  Administered 2016-02-11: 20 mg via INTRAVENOUS
  Filled 2016-02-11: qty 50

## 2016-02-11 MED ORDER — PACLITAXEL CHEMO INJECTION 300 MG/50ML
175.0000 mg/m2 | Freq: Once | INTRAVENOUS | Status: AC
  Administered 2016-02-11: 234 mg via INTRAVENOUS
  Filled 2016-02-11: qty 39

## 2016-02-11 MED ORDER — DIPHENHYDRAMINE HCL 50 MG/ML IJ SOLN
50.0000 mg | Freq: Once | INTRAMUSCULAR | Status: AC
  Administered 2016-02-11: 50 mg via INTRAVENOUS
  Filled 2016-02-11: qty 1

## 2016-02-11 MED ORDER — SODIUM CHLORIDE 0.9 % IV SOLN
14.5000 mg/kg | Freq: Once | INTRAVENOUS | Status: AC
  Administered 2016-02-11: 600 mg via INTRAVENOUS
  Filled 2016-02-11: qty 8

## 2016-02-11 MED ORDER — CARBOPLATIN CHEMO INJECTION 600 MG/60ML
436.0000 mg | Freq: Once | INTRAVENOUS | Status: AC
  Administered 2016-02-11: 440 mg via INTRAVENOUS
  Filled 2016-02-11: qty 44

## 2016-02-11 MED ORDER — LORAZEPAM 1 MG PO TABS: ORAL_TABLET | ORAL | 1 refills | Status: DC

## 2016-02-11 NOTE — Progress Notes (Signed)
Tolerated infusion w/o adverse reaction.  Alert, in no distress.  VSS.  Discharged ambulatory in c/o family for transport home.

## 2016-02-11 NOTE — Patient Instructions (Signed)
[facility] Discharge Instructions for Patients Receiving Chemotherapy   Beginning January 23rd 2017 lab work for the [facility] will be done in the  Main lab at [facility] on 1st floor. If you have a lab appointment with the [place] please come in thru the  Main Entrance and check in at the main information desk   Today you received the following chemotherapy agents:  Taxol, carboplatin, and Avastin  If you develop nausea and vomiting, or diarrhea that is not controlled by your medication, call the clinic.  The clinic phone number is [phone]. Office hours are Monday-Friday 8:30am-5:00pm.  BELOW ARE SYMPTOMS THAT SHOULD BE REPORTED IMMEDIATELY:  *FEVER GREATER THAN 101.0 F  *CHILLS WITH OR WITHOUT FEVER  NAUSEA AND VOMITING THAT IS NOT CONTROLLED WITH YOUR NAUSEA MEDICATION  *UNUSUAL SHORTNESS OF BREATH  *UNUSUAL BRUISING OR BLEEDING  TENDERNESS IN MOUTH AND THROAT WITH OR WITHOUT PRESENCE OF ULCERS  *URINARY PROBLEMS  *BOWEL PROBLEMS  UNUSUAL RASH Items with * indicate a potential emergency and should be followed up as soon as possible. If you have an emergency after office hours please contact your primary care physician or go to the nearest emergency department.  Please call the clinic during office hours if you have any questions or concerns.   You may also contact the Patient Navigator at [phone] should you have any questions or need assistance in obtaining follow up care.      Resources For Cancer Patients and their Caregivers ? American Cancer Society: Can assist with transportation, wigs, general needs, runs Look Good Feel Better.        248-847-3965 ? Cancer Care: Provides financial assistance, online support groups, medication/co-pay assistance.  1-800-813-HOPE (763)664-6693) ? [place] Assists [place] cancer patients and their families through emotional , educational and financial support.   (708) 244-2164 ? [place] DSS Where to apply for food stamps, Medicaid and utility assistance. 458 311 9652 ? RCATS: Transportation to medical appointments. 5736320813 ? Social Security Administration: May apply for disability if have a Stage IV cancer. (361) 740-5644 845-507-7325 ? [employer], Disability and Transit Services: Assists with nutrition, care and transit needs. 867-385-4099

## 2016-02-11 NOTE — Progress Notes (Signed)
[place]  PROGRESS NOTE  Patient Care Team: No Pcp Per Patient as PCP - General (General Practice)  CHIEF COMPLAINTS/PURPOSE OF CONSULTATION:    Recurrent cervical cancer ([place])   02/16/2014 Procedure    Cervical biopsy by Dr. Denman [name]      02/18/2014 Pathology Results    Cervix, biopsy - SQUAMOUS CELL CARCINOMA.      03/07/2014 - 04/24/2014 Radiation Therapy    Dr. Sondra [name]- vaginal brachytherapy, 45 Gy to pelvis with sidewall boost 9 Gy and HDR 28.5 Gy in 5 fractions      03/08/2014 - 04/03/2014 Chemotherapy    Weekly CDDP 40 mg/m2 by Dr. 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      07/12/2014 PET scan    Marked interval decrease in size and hypermetabolism of the cervical mass.  The bilateral hypermetabolic pelvic sidewall lymphadenopathy has resolved in the interval.  Areas of hypermetabolic brown fat bilaterally in the chest and abdomen.      11/05/2015 Imaging    MRI abdomen at [place]- 7.0 x 5.3 cm are in left hepatic lobe with central 3.1 x 2.7 x 2.3 cm complex cystic area, as well as an apparent subcapsular foci      11/26/2015 Procedure    US aspiration of left hepatic lobe- yielding 13 cc of yellow purulent fluid (pain resolved and PO intake increased)      11/26/2015 Pathology Results    [id number] highly suspicious for squamous cell carcinoma      12/09/2015 PET scan    New small hypermetabolic right internal mammary soft tissue density, suspicious for metastatic lymphadenopathy.  Increased size and number of hypermetabolic liver metastases. New mild hypermetabolic activity in porta hepatis is suspicious for lymph node metastases.  No residual hypermetabolic cervical mass identified. Multiple uterine fibroids again seen without FDG uptake.  New diffuse rectal wall thickening with hypermetabolic activity, suspicious for proctitis which could be infectious in  etiology or secondary to radiation changes ; recommend clinical correlation. Small pelvic fluid collection between the anterior rectal wall and posterior uterine wall, possibly due to abscess.               HISTORY OF PRESENTING ILLNESS:  Erika [name] 41 y.o. female is here because of referral from Dr. Marko [name] for ongoing follow-up of metastatic squamous cell carcinoma of cervix.  Per Dr. Mariana [name] 12/12/2015 note: "Patient was initially diagnosed with squamous cell carcinoma of cervix when she presented to [facility] ED with vaginal hemorrhage on 01-30-14. She was seen by Dr Erika [name], with cervical mass identified and high grade cervical dysplasia on biopsy. She was referred to Dr Erika [name], with cervical biopsy 02-16-14 [id number]) invasive squamous cell carcinoma, clinical stage IIB with involvement of right parametria. PET 02-25-14 additionally had uptake in bilateral pelvic nodes. As patient lives in [place], [place], she was treated with radiation by Dr Erika [name] in [place], given with sensitizing weekly CDDP by Dr Erika [name]. Peripheral venous access was not adequate for chemo, such that she had PAC by Dr Erika [name] (removed after that treatment completed). Chemo was weekly CDDP 40 mg/m2 from 03-08-14 thru 04-03-14. Radiation was external beam followed by vaginal brachytherapy, 45 Gy to pelvis with sidewall boost 9 Gy and HDR 28.5 Gy in 5 fractions, from 03-07-14 thru 04-24-14.  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 She  used zofran for chemo nausea, which was severe particularly with first few CDDP treatments. She has been postmenopausal since radiation/ chemo, on prempro by gyn oncology. She had radiation diarrhea, which resolved after treatment, baseline then formed bowel movements twice daily until recent different rectal symptoms.  Patient has been followed with alternating visits by Drs Erika [name] (04-2015),  Erika [name] (07-2015) and Erika [name]. She felt well until last few months, when she had new localized pain RUQ below ribs anteriorly. Additionally she had decreased appetite with some nausea, weight loss of 8 lbs in 4 months, early satiety, increased night sweats without fever or chills, and now ~ 2 weeks of rectal discomfort with tenesmus/ pressure.  The RUQ pain progressively increased until so severe that she was unable to tolerate, at which time she was seen at [facility] ED.  MR/ MRA abdomen at [facility] 11-05-15 showed 7.0 x 5.3 cm area in left hepatic lobe with central 3.1 x 2.7 x 2.3 cm central complex cystic area, as well as apparent subcapsular foci. US aspiration of the left hepatic lobe 11-26-15 yielded 13 cc of yellow purulent fluid, after which pain improved and po intake easier; pathology [id number]) highly suspicious for squamous cell carcinoma, with aerobic and anaerobic cultures no growth. PET 12-09-15 compared with 07-12-14 and other imaging showed new 1.2 cm right parasternal/ internal mammary soft tissue, no pulmonary nodules or chest adenopathy, right hepatic lobe mass 5.1 x 7 cm with SUV 20.7 (previously SUV 5.3 with no CT correlate 07-2014), small hypermetabolic lesions along capsular surface lateral right hepatic lobe, uptake in porta hepatis, no other hypermetabolic nodes abdomen/ pelvis. PET also shows new hypermetabolic activity in rectum with SUV 20.2 with diffuse rectal wall thickening and irregular low attenuation area between anterior rectum and posterior uterine wall 2.5 x 4.9 cm "suspicious for abscess" per radiologist.  Patient has discussed situation with Dr Erika [name] by phone, with recommendation for systemic treatment with carboplatin taxol avastin."  [name] presents to the [facility] today accompanied by her boyfriend. She is here for cycle 2 of Carboplatin/Taxol/Avastin.    She is still having constipation, even after she takes miralax and dulcolax. She says that this is what is  causing her the most problems.   Other than that, she is doing pretty good. She says she has been handling everything well. She just wants to get the treatment over with. She notes that she does have moments.   She has some aching and tingling in her fingers when she is in the cold.   She has been eating and says she gained 2 pounds. Denies mouth sores or leg swelling.   Her RUQ pain is still present, but better.   MEDICAL HISTORY:  Past Medical History:  Diagnosis Date  . Anxiety   . Cervical cancer ([place])   . Cervical cancer, FIGO stage IIB ([place]) 04/09/2014  . Family history of adverse reaction to anesthesia    pt states her sister has to have the "reversal" after surgery   . Hypokalemia   . Microcytic hypochromic anemia   . Recurrent cervical cancer ([place]) 12/12/2015    SURGICAL HISTORY: Past Surgical History:  Procedure Laterality Date  . CESAREAN SECTION    . port a cath placement      January 2016 / right   . TANDEM RING INSERTION N/A 04/09/2014   Procedure: TANDEM RING PLACEMENT FOR HIGH DOSE RATE RADIATION THERAPY EXAM UNDER ANETHESIA PLACEMENT OF CERVICAL SLEEVE;  Surgeon: Gery Pray, MD;  Location: [facility];  Service: Urology;  Laterality: N/A;  . TANDEM RING INSERTION N/A 04/23/2014   Procedure: TANDEM RING PLACEMENT ;  Surgeon: Gery Pray, MD;  Location: [facility] ORS;  Service: Urology;  Laterality: N/A;  . TANDEM RING INSERTION N/A 04/30/2014   Procedure: TANDEM RING PLACEMENT,EXAM UNDER ANESTHESIA ;  Surgeon: Gery Pray, MD;  Location: [facility] ORS;  Service: Urology;  Laterality: N/A;  . TANDEM RING INSERTION N/A 05/07/2014   Procedure: TANDEM RING PLACEMENT;  Surgeon: Gery Pray, MD;  Location: [facility] ORS;  Service: Urology;  Laterality: N/A;  . TANDEM RING INSERTION N/A 05/17/2014   Procedure: TANDEM RING INSERTION;  Surgeon: Gery Pray, MD;  Location: [facility];  Service: Urology;  Laterality: N/A;  . TUBAL LIGATION      SOCIAL HISTORY: Social History    Social History  . Marital status: Single    Spouse name: N/A  . Number of children: 2  . Years of education: N/A   Occupational History  . cook at [employer] History Main Topics  . Smoking status: Former Smoker    Packs/day: 0.50    Years: 6.00    Types: Cigarettes    Quit date: 08/02/2011  . Smokeless tobacco: Never Used  . Alcohol use No  . Drug use:     Types: Marijuana  . Sexual activity: Yes   Other Topics Concern  . Not on file   Social History Narrative  . No narrative on file   She lives in [place] with her boyfriend and youngest daughter, who is 77 yo. Her boyfriend does not work but receives disability. The patient does not currently work. Her oldest daughter is 12 yo and lives outside of the home. The patient is not sure how disability works as she has always worked until now.  She previously worked in [employer] She has a Magazine features editor, Dealer Non smoker  FAMILY HISTORY: Family History  Problem Relation Age of Onset  . Asthma Mother   . Heart attack Father    Mother deceased of an asthma attack at 70 yo Father deceased of a heart attack in his 50's One sister in her early 5's. She has blood clots. She had one in her leg which caused cellulitis. She has a work-related disc injury in her back.   ALLERGIES:  has No Known Allergies.  MEDICATIONS:  Current Outpatient Prescriptions  Medication Sig Dispense Refill  . BEVACIZUMAB IV Inject into the vein.    . calcium-vitamin D (OSCAL 500/200 D-3) 500-200 MG-UNIT per tablet Take 1 tablet by mouth 2 (two) times daily. 60 tablet 11  . CARBOPLATIN IV Inject into the vein.    Marland Kitchen dexamethasone (DECADRON) 4 MG tablet Take 2 tablets (8 mg total) by mouth daily. Start the day after chemotherapy for 2 days. 30 tablet 1  . estrogen, conjugated,-medroxyprogesterone (PREMPRO) 0.3-1.5 MG per tablet Take 1 tablet by mouth daily. 30 tablet 11  . ferrous sulfate 325 (65 FE) MG tablet Take 325 mg by mouth daily with breakfast.     . HYDROcodone-acetaminophen (NORCO/VICODIN) 5-325 MG tablet Take 1 tablet by mouth every 6 (six) hours as needed for moderate pain. 60 tablet 0  . lidocaine-prilocaine (EMLA) cream Apply to affected area once (Patient taking differently: Apply 1 application topically daily as needed. Apply to affected area once) 30 g 3  . LORazepam (ATIVAN) 1 MG tablet TAKE 1 TABLET BY MOUTH AT BEDTIME AS NEEDED FOR ANXIETY /INSOMNIA 30 tablet 1  .  naproxen sodium (ANAPROX) 220 MG tablet Take 220 mg by mouth 4 (four) times daily as needed.    . ondansetron (ZOFRAN) 8 MG tablet Take 1 tablet (8 mg total) by mouth 2 (two) times daily as needed for refractory nausea / vomiting. Start on day 3 after chemo. 30 tablet 1  . PACLITAXEL IV Inject into the vein.    Marland Kitchen prochlorperazine (COMPAZINE) 10 MG tablet Take 1 tablet (10 mg total) by mouth every 6 (six) hours as needed (Nausea or vomiting). 30 tablet 1   No current facility-administered medications for this visit.     Review of Systems  Constitutional: Negative.   HENT: Negative.        Neg mouth sores.  Eyes: Negative.   Respiratory: Negative.   Cardiovascular: Negative.  Negative for leg swelling.  Gastrointestinal: Positive for constipation.  Genitourinary: Negative.   Musculoskeletal:       Aching  Skin: Negative.   Neurological: Positive for tingling (fingers).  Endo/Heme/Allergies: Negative.   Psychiatric/Behavioral: Negative.   [name] other systems reviewed and are negative. 14 point ROS was done and is otherwise as detailed above or in HPI   PHYSICAL EXAMINATION: ECOG PERFORMANCE STATUS: 1 - Symptomatic but completely ambulatory  Vitals:   02/11/16 0938  BP: (!) 110/59  Pulse: 85  Resp: 16  Temp: 97.6 F (36.4 C)   Filed Weights   02/11/16 0938  Weight: 87 lb 6.4 oz (39.6 kg)    Physical Exam  Constitutional: She is oriented to person, place, and time and well-developed, well-nourished, and in no distress.  HENT:  Head:  Normocephalic and atraumatic.  Nose: Nose normal.  Mouth/Throat: Oropharynx is clear and moist. No oropharyngeal exudate.  Eyes: Conjunctivae and EOM are normal. Pupils are equal, round, and reactive to light. Right eye exhibits no discharge. Left eye exhibits no discharge. No scleral icterus.  Neck: Normal range of motion. Neck supple. No tracheal deviation present. No thyromegaly present.  Cardiovascular: Normal rate, regular rhythm and normal heart sounds.  Exam reveals no gallop and no friction rub.   No murmur heard. Pulmonary/Chest: Effort normal and breath sounds normal. She has no wheezes. She has no rales.  Abdominal: Soft. Bowel sounds are normal. She exhibits no distension and no mass. There is no tenderness. There is no rebound and no guarding.  Musculoskeletal: Normal range of motion. She exhibits no edema.  Lymphadenopathy:    She has no cervical adenopathy.  Neurological: She is alert and oriented to person, place, and time. She has normal reflexes. No cranial nerve deficit. Gait normal. Coordination normal.  Skin: Skin is warm and dry. No rash noted.  Psychiatric: Mood, memory, affect and judgment normal.  Nursing note and vitals reviewed.   LABORATORY DATA:  I have reviewed the data as listed Lab Results  Component Value Date   WBC 7.7 02/11/2016   HGB 10.7 (L) 02/11/2016   HCT 33.3 (L) 02/11/2016   MCV 93.8 02/11/2016   PLT 259 02/11/2016   CMP     Component Value Date/Time   NA 136 02/11/2016 1004   NA 140 12/12/2015 0920   K 3.9 02/11/2016 1004   K 4.0 12/12/2015 0920   CL 99 (L) 02/11/2016 1004   CO2 29 02/11/2016 1004   CO2 26 12/12/2015 0920   GLUCOSE 98 02/11/2016 1004   GLUCOSE 87 12/12/2015 0920   BUN 9 02/11/2016 1004   BUN 8.4 12/12/2015 0920   CREATININE 0.67 02/11/2016 1004   CREATININE 0.7  12/12/2015 0920   CALCIUM 9.1 02/11/2016 1004   CALCIUM 10.5 (H) 12/12/2015 0920   PROT 7.7 02/11/2016 1004   PROT 9.3 (H) 12/12/2015 0920   ALBUMIN  3.3 (L) 02/11/2016 1004   ALBUMIN 3.2 (L) 12/12/2015 0920   AST 16 02/11/2016 1004   AST 15 12/12/2015 0920   ALT 10 (L) 02/11/2016 1004   ALT 8 12/12/2015 0920   ALKPHOS 104 02/11/2016 1004   ALKPHOS 148 12/12/2015 0920   BILITOT 0.3 02/11/2016 1004   BILITOT 0.35 12/12/2015 0920   GFRNONAA >60 02/11/2016 1004   GFRAA >60 02/11/2016 1004   PATHOLOGY   RADIOGRAPHIC STUDIES: I have personally reviewed the radiological images as listed and agreed with the findings in the report. No results found. Study Result   CLINICAL DATA:  Low abdomen pain and pressure for several days, currently the patient is on chemotherapy for cervical carcinoma  EXAM: ABDOMEN - 2 VIEW  COMPARISON:  None.  FINDINGS: There is a moderate to large amount of feces throughout the colon. No bowel obstruction is seen. No free air is noted on the erect view. No opaque calculi are noted. The bones are unremarkable.  IMPRESSION: 1. Moderately large amount of feces throughout the colon. 2. No bowel obstruction or free air .   Electronically Signed   By: Ivar Drape M.D.   On: 02/05/2016 10:26    ASSESSMENT & PLAN:  Recurrent cervical cancer ([place])   Staging form: Cervix Uteri, AJCC 7th Edition   - Clinical stage from 12/12/2015: FIGO Stage IVB (T2b, N1, M1) - Signed by Gordy Levan, MD on 12/12/2015 No problem-specific Assessment & Plan notes found for this encounter. Liver metastases Rectal wall thickening/hypermetabolism on PET, negative biopsy Colonoscopy with Dr. Ladona Horns Unintentional Weight Loss Chemotherapy induced neuropathy Hx stage III cervical cancer Chemotherapy induced constipation  We discussed the importance of treating her chemotherapy induced constipation. We discussed how it can cause abdominal pain, nausea, vomiting etc. She was provided with a constipation sheet. I discussed current recommendations to alleviate her constipation and then maintenance to prevent it.   I  encouraged her to tell me if she is starting to struggle emotionally.   Labs reviewed with the patient. Results are noted above.   Refills today, per her request. Re-imaging post 3 cycles.   She will return for a follow up on 1/31 for a physical as well as treatment.   ORDERS PLACED FOR THIS ENCOUNTER: Orders Placed This Encounter  Procedures  . CBC with Differential  . Comprehensive metabolic panel  . CA 125    MEDICATIONS PRESCRIBED THIS ENCOUNTER: Meds ordered this encounter  Medications  . LORazepam (ATIVAN) 1 MG tablet    Sig: TAKE 1 TABLET BY MOUTH AT BEDTIME AS NEEDED FOR ANXIETY /INSOMNIA    Dispense:  30 tablet    Refill:  1  . HYDROcodone-acetaminophen (NORCO/VICODIN) 5-325 MG tablet    Sig: Take 1 tablet by mouth every 6 (six) hours as needed for moderate pain.    Dispense:  60 tablet    Refill:  0   [name] questions were answered. The patient knows to call the clinic with any problems, questions or concerns.  This document serves as a record of services personally performed by Ancil Linsey, MD. It was created on her behalf by Martinique Casey, a trained medical scribe. The creation of this record is based on the scribe's personal observations and the provider's statements to them. This document has been checked and  approved by the attending provider.  I have reviewed the above documentation for accuracy and completeness and I agree with the above.  This note was electronically signed.    Molli Hazard, MD  02/11/2016 9:01 PM

## 2016-02-11 NOTE — Patient Instructions (Addendum)
[place] at [facility] Discharge Instructions  RECOMMENDATIONS MADE BY THE CONSULTANT AND ANY TEST RESULTS WILL BE SENT TO YOUR REFERRING PHYSICIAN.  You were seen today by Dr. Whitney Muse. Return in 3 weeks for follow up, labs and chemo. Constipation sheet given today as well.    See Amy at checkout for appointments.  Thank you for choosing [place] at [facility] to provide your oncology and hematology care.  To afford each patient quality time with our provider, please arrive at least 15 minutes before your scheduled appointment time.    If you have a lab appointment with the [facility] please come in thru the  Main Entrance and check in at the main information desk  You need to re-schedule your appointment should you arrive 10 or more minutes late.  We strive to give you quality time with our providers, and arriving late affects you and other patients whose appointments are after yours.  Also, if you no show three or more times for appointments you may be dismissed from the clinic at the providers discretion.     Again, thank you for choosing [facility].  Our hope is that these requests will decrease the amount of time that you wait before being seen by our physicians.       _____________________________________________________________  Should you have questions after your visit to [facility], please contact our office at [phone] between the hours of 8:30 a.m. and 4:30 p.m.  Voicemails left after 4:30 p.m. will not be returned until the following business day.  For prescription refill requests, have your pharmacy contact our office.       Resources For Cancer Patients and their Caregivers ? American Cancer Society: Can assist with transportation, wigs, general needs, runs Look Good Feel Better.        575-129-4192 ? Cancer Care: Provides financial assistance, online support groups,  medication/co-pay assistance.  1-800-813-HOPE (279) 285-9036) ? [place] Assists [place] cancer patients and their families through emotional , educational and financial support.  (317)710-5526 ? [place] DSS Where to apply for food stamps, Medicaid and utility assistance. 603 680 3097 ? [facility]: Transportation to medical appointments. 774-593-1942 ? Social Security Administration: May apply for disability if have a Stage IV cancer. 607-235-3492 408-133-8886 ? [employer], Disability and Transit Services: Assists with nutrition, care and transit needs. [place] Support Programs: @10RELATIVEDAYS @ > Cancer Support Group  2nd Tuesday of the month 1pm-2pm, Journey Room  > Creative Journey  3rd Tuesday of the month 1130am-1pm, Journey Room  > Look Good Feel Better  1st Wednesday of the month 10am-12 noon, Journey Room (Call [place] to register [phone])

## 2016-02-13 ENCOUNTER — Encounter (HOSPITAL_COMMUNITY): Payer: Medicaid - Out of State

## 2016-02-13 NOTE — Progress Notes (Signed)
Nutrition Assessment   Reason for Assessment:   MD referral received for weight loss  ASSESSMENT:  41 year old female with metastatic squamous cell carcinoma of cervix. Past medical history of reviewed.    Patient comes to clinic today for nutrition appointment.  Patient reports some days appetite is good and others not so good.  Reports that she is drinking boost optimum.  Reports that she has constipation.  Wanting to know what foods/snacks are high in calories   Nutrition Focused Physical Exam: Nutrition-Focused physical exam completed. Findings are moderate fat depletion, moderate muscle depletion, and no edema.    Medications: decadron, zofran, compazine, miralax (reports causes gas), MOM and mag citrate  Labs: reviewed  Anthropometrics:   Height: 60 inches Weight: 87 pounds (increased by 2 pounds since last visit on 1/4)  BMI: 17  Noted weight on 1/4 85 pounds on 12/20 89 lb and on 11/10 90 lb   Estimated Energy Needs  Kcals: 1200-1400 calories/d Protein: 48 - 60 g/d Fluid: 1.4 L/d  NUTRITION DIAGNOSIS: Malnutrition related to cancer and cancer related treatments as evidenced by moderate muscle mass loss and fat loss   MALNUTRITION DIAGNOSIS: Moderate malnutrition in context of chronic illness   INTERVENTION:   Discussed ways to increase calories and protein. Handout provided Also discussed strategies to assist with constipation. Handout provided.   Discussed ensure program with patient and patient aware she is eligible to receive up to 3 complimentary cases of ensure plus.  I will order her first complimentary case today.  It will be ready for pick up on Tuesday and patient informed.  Coupons and samples of boost optimum provided    MONITORING, EVALUATION, GOAL: Patient will consume adequate calories and protein to maintain/gain weight   NEXT VISIT: Feb 16th at [place]. Zenia Resides, [place], [place] (pager)

## 2016-03-03 ENCOUNTER — Encounter (HOSPITAL_COMMUNITY): Payer: Self-pay

## 2016-03-03 ENCOUNTER — Encounter (HOSPITAL_BASED_OUTPATIENT_CLINIC_OR_DEPARTMENT_OTHER): Payer: Medicaid - Out of State

## 2016-03-03 ENCOUNTER — Encounter (HOSPITAL_BASED_OUTPATIENT_CLINIC_OR_DEPARTMENT_OTHER): Payer: Medicaid - Out of State | Admitting: Oncology

## 2016-03-03 VITALS — BP 128/87 | HR 75 | Temp 97.4°F | Resp 16

## 2016-03-03 DIAGNOSIS — G620 Drug-induced polyneuropathy: Secondary | ICD-10-CM

## 2016-03-03 DIAGNOSIS — Z5112 Encounter for antineoplastic immunotherapy: Secondary | ICD-10-CM | POA: Diagnosis not present

## 2016-03-03 DIAGNOSIS — C539 Malignant neoplasm of cervix uteri, unspecified: Secondary | ICD-10-CM

## 2016-03-03 DIAGNOSIS — R634 Abnormal weight loss: Secondary | ICD-10-CM

## 2016-03-03 DIAGNOSIS — C787 Secondary malignant neoplasm of liver and intrahepatic bile duct: Secondary | ICD-10-CM | POA: Diagnosis not present

## 2016-03-03 DIAGNOSIS — Z5111 Encounter for antineoplastic chemotherapy: Secondary | ICD-10-CM | POA: Diagnosis not present

## 2016-03-03 DIAGNOSIS — K5909 Other constipation: Secondary | ICD-10-CM | POA: Diagnosis not present

## 2016-03-03 LAB — CBC WITH DIFFERENTIAL/PLATELET
BASOS ABS: 0 10*3/uL (ref 0.0–0.1)
BASOS PCT: 0 %
EOS ABS: 0 10*3/uL (ref 0.0–0.7)
EOS PCT: 0 %
HCT: 33.5 % — ABNORMAL LOW (ref 36.0–46.0)
HEMOGLOBIN: 10.9 g/dL — AB (ref 12.0–15.0)
LYMPHS PCT: 15 %
Lymphs Abs: 0.7 10*3/uL (ref 0.7–4.0)
MCH: 32 pg (ref 26.0–34.0)
MCHC: 32.5 g/dL (ref 30.0–36.0)
MCV: 98.2 fL (ref 78.0–100.0)
Monocytes Absolute: 0.2 10*3/uL (ref 0.1–1.0)
Monocytes Relative: 5 %
NEUTROS ABS: 3.6 10*3/uL (ref 1.7–7.7)
Neutrophils Relative %: 80 %
PLATELETS: 130 10*3/uL — AB (ref 150–400)
RBC: 3.41 MIL/uL — AB (ref 3.87–5.11)
RDW: 18.8 % — ABNORMAL HIGH (ref 11.5–15.5)
WBC: 4.5 10*3/uL (ref 4.0–10.5)

## 2016-03-03 LAB — COMPREHENSIVE METABOLIC PANEL
ALBUMIN: 3 g/dL — AB (ref 3.5–5.0)
ALK PHOS: 98 U/L (ref 38–126)
ALT: 10 U/L — AB (ref 14–54)
AST: 14 U/L — AB (ref 15–41)
Anion gap: 6 (ref 5–15)
BUN: 16 mg/dL (ref 6–20)
CALCIUM: 9.1 mg/dL (ref 8.9–10.3)
CHLORIDE: 102 mmol/L (ref 101–111)
CO2: 30 mmol/L (ref 22–32)
CREATININE: 0.57 mg/dL (ref 0.44–1.00)
GFR calc Af Amer: >60 mL/min (ref >60)
GFR calc non Af Amer: >60 mL/min (ref >60)
GLUCOSE: 92 mg/dL (ref 65–99)
Potassium: 3.8 mmol/L (ref 3.5–5.1)
SODIUM: 138 mmol/L (ref 135–145)
Total Bilirubin: 0.2 mg/dL — ABNORMAL LOW (ref 0.3–1.2)
Total Protein: 7 g/dL (ref 6.5–8.1)

## 2016-03-03 LAB — URINALYSIS, DIPSTICK ONLY
BILIRUBIN URINE: NEGATIVE
GLUCOSE, UA: NEGATIVE mg/dL
Hgb urine dipstick: NEGATIVE
KETONES UR: NEGATIVE mg/dL
Leukocytes, UA: NEGATIVE
NITRITE: NEGATIVE
Protein, ur: NEGATIVE mg/dL
SPECIFIC GRAVITY, URINE: 1.01 (ref 1.005–1.030)
pH: 5.5 (ref 5.0–8.0)

## 2016-03-03 MED ORDER — SODIUM CHLORIDE 0.9% FLUSH: 10.0000 mL | INTRAVENOUS | Status: DC | PRN

## 2016-03-03 MED ORDER — PALONOSETRON HCL INJECTION 0.25 MG/5ML
INTRAVENOUS | Status: AC
  Filled 2016-03-03: qty 5

## 2016-03-03 MED ORDER — FAMOTIDINE IN NACL 20-0.9 MG/50ML-% IV SOLN
20.0000 mg | Freq: Once | INTRAVENOUS | Status: AC
  Administered 2016-03-03: 20 mg via INTRAVENOUS

## 2016-03-03 MED ORDER — DIPHENHYDRAMINE HCL 50 MG/ML IJ SOLN
50.0000 mg | Freq: Once | INTRAMUSCULAR | Status: AC
  Administered 2016-03-03: 50 mg via INTRAVENOUS
  Filled 2016-03-03: qty 1

## 2016-03-03 MED ORDER — SODIUM CHLORIDE 0.9 % IV SOLN
436.0000 mg | Freq: Once | INTRAVENOUS | Status: AC
  Administered 2016-03-03: 440 mg via INTRAVENOUS
  Filled 2016-03-03: qty 44

## 2016-03-03 MED ORDER — HEPARIN SOD (PORK) LOCK FLUSH 100 UNIT/ML IV SOLN
500.0000 [IU] | Freq: Once | INTRAVENOUS | Status: AC | PRN
  Administered 2016-03-03: 500 [IU]
  Filled 2016-03-03: qty 5

## 2016-03-03 MED ORDER — PACLITAXEL CHEMO INJECTION 300 MG/50ML
175.0000 mg/m2 | Freq: Once | INTRAVENOUS | Status: AC
  Administered 2016-03-03: 234 mg via INTRAVENOUS
  Filled 2016-03-03: qty 39

## 2016-03-03 MED ORDER — PALONOSETRON HCL INJECTION 0.25 MG/5ML
0.2500 mg | Freq: Once | INTRAVENOUS | Status: AC
  Administered 2016-03-03: 0.25 mg via INTRAVENOUS

## 2016-03-03 MED ORDER — SODIUM CHLORIDE 0.9 % IV SOLN
14.5000 mg/kg | Freq: Once | INTRAVENOUS | Status: AC
  Administered 2016-03-03: 600 mg via INTRAVENOUS
  Filled 2016-03-03: qty 16

## 2016-03-03 MED ORDER — SODIUM CHLORIDE 0.9 % IV SOLN
Freq: Once | INTRAVENOUS | Status: AC
  Administered 2016-03-03: 11:00:00 via INTRAVENOUS

## 2016-03-03 MED ORDER — PEGFILGRASTIM 6 MG/0.6ML ~~LOC~~ PSKT
6.0000 mg | PREFILLED_SYRINGE | Freq: Once | SUBCUTANEOUS | Status: AC
  Administered 2016-03-03: 6 mg via SUBCUTANEOUS
  Filled 2016-03-03: qty 0.6

## 2016-03-03 MED ORDER — SODIUM CHLORIDE 0.9 % IV SOLN
20.0000 mg | Freq: Once | INTRAVENOUS | Status: AC
  Administered 2016-03-03: 20 mg via INTRAVENOUS
  Filled 2016-03-03: qty 2

## 2016-03-03 MED ORDER — FAMOTIDINE IN NACL 20-0.9 MG/50ML-% IV SOLN
INTRAVENOUS | Status: AC
  Filled 2016-03-03: qty 50

## 2016-03-03 MED ORDER — HYDROCODONE-ACETAMINOPHEN 5-325 MG PO TABS
1.0000 | ORAL_TABLET | Freq: Four times a day (QID) | ORAL | 0 refills | Status: DC | PRN
Start: 2016-03-03 — End: 2016-03-18

## 2016-03-03 NOTE — Progress Notes (Signed)
Tolerated tx w/o adverse reaction.  Alert, in no distress.  VSS.  Discharged ambulatory in c/o family.  

## 2016-03-03 NOTE — Patient Instructions (Signed)
[place] at [facility] Discharge Instructions  RECOMMENDATIONS MADE BY THE CONSULTANT AND ANY TEST RESULTS WILL BE SENT TO YOUR REFERRING PHYSICIAN.  You were seen today by Dr. Barron Schmid Follow up in 3 weeks w/chemo We gave you application for handicap placard.  Thank you for choosing [place] at [facility] to provide your oncology and hematology care.  To afford each patient quality time with our provider, please arrive at least 15 minutes before your scheduled appointment time.    If you have a lab appointment with the [place] [facility] please come in thru the  Main Entrance and check in at the main information desk  You need to re-schedule your appointment should you arrive 10 or more minutes late.  We strive to give you quality time with our providers, and arriving late affects you and other patients whose appointments are after yours.  Also, if you no show three or more times for appointments you may be dismissed from the clinic at the providers discretion.     Again, thank you for choosing [facility].  Our hope is that these requests will decrease the amount of time that you wait before being seen by our physicians.       _____________________________________________________________  Should you have questions after your visit to [facility], please contact our office at [phone] between the hours of 8:30 a.m. and 4:30 p.m.  Voicemails left after 4:30 p.m. will not be returned until the following business day.  For prescription refill requests, have your pharmacy contact our office.       Resources For Cancer Patients and their Caregivers ? American Cancer Society: Can assist with transportation, wigs, general needs, runs Look Good Feel Better.        860-417-1731 ? Cancer Care: Provides financial assistance, online support groups, medication/co-pay assistance.  1-800-813-HOPE 830-099-8676) ? [place] Assists [place] cancer patients and their families through emotional , educational and financial support.  604-183-1455 ? [place] DSS Where to apply for food stamps, Medicaid and utility assistance. 551-019-9055 ? RCATS: Transportation to medical appointments. (971)532-6493 ? Social Security Administration: May apply for disability if have a Stage IV cancer. 640-246-8939 615-482-5105 ? [employer], Disability and Transit Services: Assists with nutrition, care and transit needs. [place] Support Programs: @10RELATIVEDAYS @ > Cancer Support Group  2nd Tuesday of the month 1pm-2pm, Journey Room  > Creative Journey  3rd Tuesday of the month 1130am-1pm, Journey Room  > Look Good Feel Better  1st Wednesday of the month 10am-12 noon, Journey Room (Call [place] to register [phone])

## 2016-03-03 NOTE — Patient Instructions (Signed)
[facility] Discharge Instructions for Patients Receiving Chemotherapy   Beginning January 23rd 2017 lab work for the [facility] will be done in the  Main lab at [facility] on 1st floor. If you have a lab appointment with the [place] please come in thru the  Main Entrance and check in at the main information desk   Today you received the following chemotherapy agents:  Avastin, Taxol, and Carboplatin  If you develop nausea and vomiting, or diarrhea that is not controlled by your medication, call the clinic.  The clinic phone number is [phone]. Office hours are Monday-Friday 8:30am-5:00pm.  BELOW ARE SYMPTOMS THAT SHOULD BE REPORTED IMMEDIATELY:  *FEVER GREATER THAN 101.0 F  *CHILLS WITH OR WITHOUT FEVER  NAUSEA AND VOMITING THAT IS NOT CONTROLLED WITH YOUR NAUSEA MEDICATION  *UNUSUAL SHORTNESS OF BREATH  *UNUSUAL BRUISING OR BLEEDING  TENDERNESS IN MOUTH AND THROAT WITH OR WITHOUT PRESENCE OF ULCERS  *URINARY PROBLEMS  *BOWEL PROBLEMS  UNUSUAL RASH Items with * indicate a potential emergency and should be followed up as soon as possible. If you have an emergency after office hours please contact your primary care physician or go to the nearest emergency department.  Please call the clinic during office hours if you have any questions or concerns.   You may also contact the Patient Navigator at [phone] should you have any questions or need assistance in obtaining follow up care.      Resources For Cancer Patients and their Caregivers ? American Cancer Society: Can assist with transportation, wigs, general needs, runs Look Good Feel Better.        [phone] ? Cancer Care: Provides financial assistance, online support groups, medication/co-pay assistance.  1-800-813-HOPE [phone]) ? [place] Assists [place] cancer patients and their families through emotional , educational and financial support.   380-554-2732 ? [place] DSS Where to apply for food stamps, Medicaid and utility assistance. 9383312835 ? RCATS: Transportation to medical appointments. 605-813-1388 ? Social Security Administration: May apply for disability if have a Stage IV cancer. (971)319-7272 308-022-9521 ? [employer], Disability and Transit Services: Assists with nutrition, care and transit needs. [phone]

## 2016-03-03 NOTE — Progress Notes (Signed)
[facility]  PROGRESS NOTE  Patient Care Team: No Pcp Per Patient as PCP - General (General Practice)  CHIEF COMPLAINTS/PURPOSE OF CONSULTATION:    Recurrent cervical cancer ([place])   02/16/2014 Procedure    Cervical biopsy by Dr. Denman George      02/18/2014 Pathology Results    Cervix, biopsy - SQUAMOUS CELL CARCINOMA.      03/07/2014 - 04/24/2014 Radiation Therapy    Dr. Sondra Come- vaginal brachytherapy, 45 Gy to pelvis with sidewall boost 9 Gy and HDR 28.5 Gy in 5 fractions      03/08/2014 - 04/03/2014 Chemotherapy    Weekly CDDP 40 mg/m2 by Dr. 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      07/12/2014 PET scan    Marked interval decrease in size and hypermetabolism of the cervical mass.  The bilateral hypermetabolic pelvic sidewall lymphadenopathy has resolved in the interval.  Areas of hypermetabolic brown fat bilaterally in the chest and abdomen.      11/05/2015 Imaging    MRI abdomen at [facility]- 7.0 x 5.3 cm are in left hepatic lobe with central 3.1 x 2.7 x 2.3 cm complex cystic area, as well as an apparent subcapsular foci      11/26/2015 Procedure    US aspiration of left hepatic lobe- yielding 13 cc of yellow purulent fluid (pain resolved and PO intake increased)      11/26/2015 Pathology Results    [id number] highly suspicious for squamous cell carcinoma      12/09/2015 PET scan    New small hypermetabolic right internal mammary soft tissue density, suspicious for metastatic lymphadenopathy.  Increased size and number of hypermetabolic liver metastases. New mild hypermetabolic activity in porta hepatis is suspicious for lymph node metastases.  No residual hypermetabolic cervical mass identified. Multiple uterine fibroids again seen without FDG uptake.  New diffuse rectal wall thickening with hypermetabolic activity, suspicious for proctitis which could be infectious in  etiology or secondary to radiation changes ; recommend clinical correlation. Small pelvic fluid collection between the anterior rectal wall and posterior uterine wall, possibly due to abscess.               HISTORY OF PRESENTING ILLNESS:  Erika [name] 41 y.o. female is here because of referral from Dr. Marko Plume for ongoing follow-up of metastatic squamous cell carcinoma of cervix.  Per Dr. Mariana Kaufman 12/12/2015 note: "Patient was initially diagnosed with squamous cell carcinoma of cervix when she presented to [facility] ED with vaginal hemorrhage on 01-30-14. She was seen by Dr Evie Lacks, with cervical mass identified and high grade cervical dysplasia on biopsy. She was referred to Dr Denman George, with cervical biopsy 02-16-14 [id number]) invasive squamous cell carcinoma, clinical stage IIB with involvement of right parametria. PET 02-25-14 additionally had uptake in bilateral pelvic nodes. As patient lives in [place], [place], she was treated with radiation by Dr Sondra Come in [place], given with sensitizing weekly CDDP by Dr Everardo All. Peripheral venous access was not adequate for chemo, such that she had PAC by Dr R.Cathey (removed after that [name] completed). Chemo was weekly CDDP 40 mg/m2 from 03-08-14 thru 04-03-14. Radiation was external beam followed by vaginal brachytherapy, 45 Gy to pelvis with sidewall boost 9 Gy and HDR 28.5 Gy in 5 fractions, from 03-07-14 thru 04-24-14.  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 She  used zofran for chemo nausea, which was severe particularly with first few CDDP treatments. She has been postmenopausal since radiation/ chemo, on prempro by gyn oncology. She had radiation diarrhea, which resolved after [name], baseline then formed bowel movements twice daily until recent different rectal symptoms.  Patient has been followed with alternating visits by Drs Denman George (04-2015),  Kin[place] (07-2015) and [place]. She felt well until last few months, when she had new localized pain RUQ below ribs anteriorly. Additionally she had decreased appetite with some nausea, weight loss of 8 lbs in 4 months, early satiety, increased night sweats without fever or chills, and now ~ 2 weeks of rectal discomfort with tenesmus/ pressure.  The RUQ pain progressively increased until so severe that she was unable to tolerate, at which time she was seen at [facility] ED.  MR/ MRA abdomen at [facility] 11-05-15 showed 7.0 x 5.3 cm area in left hepatic lobe with central 3.1 x 2.7 x 2.3 cm central complex cystic area, as well as apparent subcapsular foci. US aspiration of the left hepatic lobe 11-26-15 yielded 13 cc of yellow purulent fluid, after which pain improved and po intake easier; pathology [id number]) highly suspicious for squamous cell carcinoma, with aerobic and anaerobic cultures no growth. PET 12-09-15 compared with 07-12-14 and other imaging showed new 1.2 cm right parasternal/ internal mammary soft tissue, no pulmonary nodules or chest adenopathy, right hepatic lobe mass 5.1 x 7 cm with SUV 20.7 (previously SUV 5.3 with no CT correlate 07-2014), small hypermetabolic lesions along capsular surface lateral right hepatic lobe, uptake in porta hepatis, no other hypermetabolic nodes abdomen/ pelvis. PET also shows new hypermetabolic activity in rectum with SUV 20.2 with diffuse rectal wall thickening and irregular low attenuation area between anterior rectum and posterior uterine wall 2.5 x 4.9 cm "suspicious for abscess" per radiologist.  Patient has discussed situation with Dr Denman George by phone, with recommendation for systemic [name] with carboplatin taxol avastin."  Erika [name].  She has some vaginal pain. Her vaginal pain most commonly occurs when she is cold. She has some SOB and fatigue after exertion. She would like a  handicapped sticker so she doesn't have to walk as far. Denies loss of appetite, chest pain, nausea, vomiting, diarrhea, or any other complaints.   MEDICAL HISTORY:  Past Medical History:  Diagnosis Date  . Anxiety   . Cervical cancer ([place])   . Cervical cancer, FIGO stage IIB ([place]) 04/09/2014  . Family history of adverse reaction to anesthesia    pt states her sister has to have the "reversal" after surgery   . Hypokalemia   . Microcytic hypochromic anemia   . Recurrent cervical cancer ([place]) 12/12/2015    SURGICAL HISTORY: Past Surgical History:  Procedure Laterality Date  . CESAREAN SECTION    . port a cath placement      January 2016 / right   . TANDEM RING INSERTION N/A 04/09/2014   Procedure: TANDEM RING PLACEMENT FOR HIGH DOSE RATE RADIATION THERAPY EXAM UNDER ANETHESIA PLACEMENT OF CERVICAL SLEEVE;  Surgeon: Gery Pray, MD;  Location: [facility];  Service: Urology;  Laterality: N/A;  . TANDEM RING INSERTION N/A 04/23/2014   Procedure: TANDEM RING PLACEMENT ;  Surgeon: Gery Pray, MD;  Location: [facility];  Service: Urology;  Laterality: N/A;  . TANDEM RING INSERTION N/A 04/30/2014   Procedure: TANDEM RING PLACEMENT,EXAM UNDER ANESTHESIA ;  Surgeon: Gery Pray, MD;  Location:  [facility];  Service: Urology;  Laterality: N/A;  . TANDEM RING INSERTION N/A 05/07/2014   Procedure: TANDEM RING PLACEMENT;  Surgeon: Gery Pray, MD;  Location: [facility];  Service: Urology;  Laterality: N/A;  . TANDEM RING INSERTION N/A 05/17/2014   Procedure: TANDEM RING INSERTION;  Surgeon: Gery Pray, MD;  Location: [facility];  Service: Urology;  Laterality: N/A;  . TUBAL LIGATION      SOCIAL HISTORY: Social History   Social History  . Marital status: Single    Spouse name: N/A  . Number of children: 2  . Years of education: N/A   Occupational History  . cook at [employer] History Main Topics  . Smoking status: Former Smoker    Packs/day: 0.50    Years: 6.00    Types:  Cigarettes    Quit date: 08/02/2011  . Smokeless tobacco: Never Used  . Alcohol use No  . Drug use: Yes    Types: Marijuana  . Sexual activity: Yes   Other Topics Concern  . Not on file   Social History Narrative  . No narrative on file   She lives in [place] with her boyfriend and youngest daughter, who is 84 yo. Her boyfriend does not work but receives disability. The patient does not currently work. Her oldest daughter is 24 yo and lives outside of the home. The patient is not sure how disability works as she has always worked until now.  She previously worked in [employer] She has a Magazine features editor, Dealer Non smoker  FAMILY HISTORY: Family History  Problem Relation Age of Onset  . Asthma Mother   . Heart attack Father    Mother deceased of an asthma attack at 45 yo Father deceased of a heart attack in his 62's One sister in her early 63's. She has blood clots. She had one in her leg which caused cellulitis. She has a work-related disc injury in her back.   ALLERGIES:  has No Known Allergies.  MEDICATIONS:  Current Outpatient Prescriptions  Medication Sig Dispense Refill  . BEVACIZUMAB IV Inject into the vein.    . calcium-vitamin D (OSCAL 500/200 D-3) 500-200 MG-UNIT per tablet Take 1 tablet by mouth 2 (two) times daily. 60 tablet 11  . CARBOPLATIN IV Inject into the vein.    Marland Kitchen dexamethasone (DECADRON) 4 MG tablet Take 2 tablets (8 mg total) by mouth daily. Start the day after chemotherapy for 2 days. 30 tablet 1  . estrogen, conjugated,-medroxyprogesterone (PREMPRO) 0.3-1.5 MG per tablet Take 1 tablet by mouth daily. 30 tablet 11  . ferrous sulfate 325 (65 FE) MG tablet Take 325 mg by mouth daily with breakfast.    . HYDROcodone-acetaminophen (NORCO/VICODIN) 5-325 MG tablet Take 1 tablet by mouth every 6 (six) hours as needed for moderate pain. 60 tablet 0  . lidocaine-prilocaine (EMLA) cream Apply to affected area once (Patient taking differently: Apply 1 application topically  daily as needed. Apply to affected area once) 30 g 3  . LORazepam (ATIVAN) 1 MG tablet TAKE 1 TABLET BY MOUTH AT BEDTIME AS NEEDED FOR ANXIETY /INSOMNIA 30 tablet 1  . naproxen sodium (ANAPROX) 220 MG tablet Take 220 mg by mouth 4 (four) times daily as needed.    . ondansetron (ZOFRAN) 8 MG tablet Take 1 tablet (8 mg total) by mouth 2 (two) times daily as needed for refractory nausea / vomiting. Start on day 3 after chemo. 30 tablet 1  . PACLITAXEL IV  Inject into the vein.    Marland Kitchen prochlorperazine (COMPAZINE) 10 MG tablet Take 1 tablet (10 mg total) by mouth every 6 (six) hours as needed (Nausea or vomiting). 30 tablet 1   No current facility-administered medications for this visit.     Review of Systems  Constitutional: Positive for malaise/fatigue.       Alopecia  HENT: Negative.   Eyes: Negative.   Respiratory: Positive for shortness of breath.   Cardiovascular: Negative.  Negative for chest pain.  Gastrointestinal: Negative.  Negative for diarrhea, nausea and vomiting.  Genitourinary:       Vaginal pain  Musculoskeletal: Negative.   Skin: Negative.   Neurological: Negative.   Endo/Heme/Allergies: Negative.   Psychiatric/Behavioral: Negative.   All other systems reviewed and are negative. 14 point ROS was done and is otherwise as detailed above or in HPI   PHYSICAL EXAMINATION: ECOG PERFORMANCE STATUS: 1 - Symptomatic but completely ambulatory  Vitals:   03/03/16 0916  BP: 114/79  Pulse: 72  Resp: 16  Temp: 98.5 F (36.9 C)   Filed Weights   03/03/16 0916  Weight: 87 lb 4.8 oz (39.6 kg)   Physical Exam  Constitutional: She is oriented to person, place, and time and well-developed, well-nourished, and in no distress.  HENT:  Head: Normocephalic and atraumatic.  Nose: Nose normal.  Mouth/Throat: Oropharynx is clear and moist. No oropharyngeal exudate.  Eyes: Conjunctivae and EOM are normal. Pupils are equal, round, and reactive to light. Right eye exhibits no  discharge. Left eye exhibits no discharge. No scleral icterus.  Neck: Normal range of motion. Neck supple. No tracheal deviation present. No thyromegaly present.  Cardiovascular: Normal rate, regular rhythm and normal heart sounds.  Exam reveals no gallop and no friction rub.   No murmur heard. Pulmonary/Chest: Effort normal and breath sounds normal. She has no wheezes. She has no rales.  Abdominal: Soft. Bowel sounds are normal. She exhibits no distension and no mass. There is no tenderness. There is no rebound and no guarding.  Musculoskeletal: Normal range of motion. She exhibits no edema.  Lymphadenopathy:    She has no cervical adenopathy.  Neurological: She is alert and oriented to person, place, and time. She has normal reflexes. No cranial nerve deficit. Gait normal. Coordination normal.  Skin: Skin is warm and dry. No rash noted.  Psychiatric: Mood, memory, affect and judgment normal.  Nursing note and vitals reviewed.   LABORATORY DATA:  I have reviewed the data as listed Lab Results  Component Value Date   WBC 7.7 02/11/2016   HGB 10.7 (L) 02/11/2016   HCT 33.3 (L) 02/11/2016   MCV 93.8 02/11/2016   PLT 259 02/11/2016   CMP     Component Value Date/Time   NA 136 02/11/2016 1004   NA 140 12/12/2015 0920   K 3.9 02/11/2016 1004   K 4.0 12/12/2015 0920   CL 99 (L) 02/11/2016 1004   CO2 29 02/11/2016 1004   CO2 26 12/12/2015 0920   GLUCOSE 98 02/11/2016 1004   GLUCOSE 87 12/12/2015 0920   BUN 9 02/11/2016 1004   BUN 8.4 12/12/2015 0920   CREATININE 0.67 02/11/2016 1004   CREATININE 0.7 12/12/2015 0920   CALCIUM 9.1 02/11/2016 1004   CALCIUM 10.5 (H) 12/12/2015 0920   PROT 7.7 02/11/2016 1004   PROT 9.3 (H) 12/12/2015 0920   ALBUMIN 3.3 (L) 02/11/2016 1004   ALBUMIN 3.2 (L) 12/12/2015 0920   AST 16 02/11/2016 1004   AST 15 12/12/2015  0920   ALT 10 (L) 02/11/2016 1004   ALT 8 12/12/2015 0920   ALKPHOS 104 02/11/2016 1004   ALKPHOS 148 12/12/2015 0920    BILITOT 0.3 02/11/2016 1004   BILITOT 0.35 12/12/2015 0920   GFRNONAA >60 02/11/2016 1004   GFRAA >60 02/11/2016 1004   PATHOLOGY   RADIOGRAPHIC STUDIES: I have personally reviewed the radiological images as listed and agreed with the findings in the report.  DG ABD 2 Views 02/05/2016 IMPRESSION: 1. Moderately large amount of feces throughout the colon. 2. No bowel obstruction or free air .  ASSESSMENT & PLAN:  Cancer Staging Recurrent cervical cancer ([place]) Staging form: Cervix Uteri, AJCC 7th Edition - Clinical stage from 12/12/2015: FIGO Stage IVB (T2b, N1, M1) - Signed by Gordy Levan, MD on 12/12/2015  No problem-specific Assessment & Plan notes found for this encounter. Liver metastases Rectal wall thickening/hypermetabolism on PET, negative biopsy Colonoscopy with Dr. Ladona Horns Unintentional Weight Loss Chemotherapy induced neuropathy Hx stage III cervical cancer Chemotherapy induced constipation  Continue with cycle 3 of carbo/taxol/avastin today. Patient is tolerating chemotherapy well.  Labs reviewed with the patient. Results are noted above.   Refills today, per her request.   Re-imaging post 3 cycles, will order on her next visit.   I have requested a Handicap sticker for her.   Refilled Norco.   She will return for a follow up in 3 weeks.     MEDICATIONS PRESCRIBED THIS ENCOUNTER: Meds ordered this encounter  Medications  . HYDROcodone-acetaminophen (NORCO/VICODIN) 5-325 MG tablet    Sig: Take 1 tablet by mouth every 6 (six) hours as needed for moderate pain.    Dispense:  60 tablet    Refill:  0   All questions were answered. The patient knows to call the clinic with any problems, questions or concerns.  This document serves as a record of services personally performed by Twana First, MD. It was created on her behalf by Martinique Casey, a trained medical scribe. The creation of this record is based on the scribe's personal observations and the  provider's statements to them. This document has been checked and approved by the attending provider.  I have reviewed the above documentation for accuracy and completeness and I agree with the above.  This note was electronically signed.    Martinique M Casey  03/03/2016 9:27 AM

## 2016-03-04 LAB — CA 125: CA 125: 9.2 U/mL (ref 0.0–38.1)

## 2016-03-11 ENCOUNTER — Encounter (HOSPITAL_COMMUNITY): Payer: Medicaid - Out of State | Attending: Oncology

## 2016-03-11 ENCOUNTER — Other Ambulatory Visit (HOSPITAL_COMMUNITY): Payer: Self-pay | Admitting: Oncology

## 2016-03-11 VITALS — BP 101/85 | HR 82 | Temp 98.5°F | Resp 18

## 2016-03-11 DIAGNOSIS — Z95828 Presence of other vascular implants and grafts: Secondary | ICD-10-CM

## 2016-03-11 DIAGNOSIS — R110 Nausea: Secondary | ICD-10-CM | POA: Diagnosis not present

## 2016-03-11 DIAGNOSIS — C787 Secondary malignant neoplasm of liver and intrahepatic bile duct: Secondary | ICD-10-CM

## 2016-03-11 DIAGNOSIS — K5903 Drug induced constipation: Secondary | ICD-10-CM | POA: Insufficient documentation

## 2016-03-11 DIAGNOSIS — R5383 Other fatigue: Secondary | ICD-10-CM

## 2016-03-11 DIAGNOSIS — E876 Hypokalemia: Secondary | ICD-10-CM

## 2016-03-11 DIAGNOSIS — E860 Dehydration: Secondary | ICD-10-CM | POA: Diagnosis not present

## 2016-03-11 DIAGNOSIS — F419 Anxiety disorder, unspecified: Secondary | ICD-10-CM | POA: Insufficient documentation

## 2016-03-11 DIAGNOSIS — Z87891 Personal history of nicotine dependence: Secondary | ICD-10-CM | POA: Diagnosis not present

## 2016-03-11 DIAGNOSIS — C539 Malignant neoplasm of cervix uteri, unspecified: Secondary | ICD-10-CM | POA: Diagnosis not present

## 2016-03-11 LAB — COMPREHENSIVE METABOLIC PANEL
ALBUMIN: 3.4 g/dL — AB (ref 3.5–5.0)
ALT: 9 U/L — ABNORMAL LOW (ref 14–54)
ANION GAP: 10 (ref 5–15)
AST: 15 U/L (ref 15–41)
Alkaline Phosphatase: 137 U/L — ABNORMAL HIGH (ref 38–126)
BILIRUBIN TOTAL: 0.3 mg/dL (ref 0.3–1.2)
BUN: 14 mg/dL (ref 6–20)
CHLORIDE: 92 mmol/L — AB (ref 101–111)
CO2: 32 mmol/L (ref 22–32)
Calcium: 9.4 mg/dL (ref 8.9–10.3)
Creatinine, Ser: 0.57 mg/dL (ref 0.44–1.00)
GFR calc Af Amer: >60 mL/min (ref >60)
GLUCOSE: 97 mg/dL (ref 65–99)
POTASSIUM: 2.9 mmol/L — AB (ref 3.5–5.1)
Sodium: 134 mmol/L — ABNORMAL LOW (ref 135–145)
TOTAL PROTEIN: 7.3 g/dL (ref 6.5–8.1)

## 2016-03-11 LAB — CBC WITH DIFFERENTIAL/PLATELET
BASOS ABS: 0 10*3/uL (ref 0.0–0.1)
Basophils Relative: 0 %
Eosinophils Absolute: 0 10*3/uL (ref 0.0–0.7)
Eosinophils Relative: 0 %
HEMATOCRIT: 33 % — AB (ref 36.0–46.0)
Hemoglobin: 10.9 g/dL — ABNORMAL LOW (ref 12.0–15.0)
LYMPHS ABS: 1.6 10*3/uL (ref 0.7–4.0)
Lymphocytes Relative: 9 %
MCH: 32.2 pg (ref 26.0–34.0)
MCHC: 33 g/dL (ref 30.0–36.0)
MCV: 97.3 fL (ref 78.0–100.0)
MONO ABS: 3 10*3/uL — AB (ref 0.1–1.0)
MONOS PCT: 17 %
NEUTROS ABS: 13.3 10*3/uL — AB (ref 1.7–7.7)
Neutrophils Relative %: 74 %
PLATELETS: 137 10*3/uL — AB (ref 150–400)
RBC: 3.39 MIL/uL — ABNORMAL LOW (ref 3.87–5.11)
RDW: 18.2 % — AB (ref 11.5–15.5)
WBC Morphology: INCREASED
WBC: 17.9 10*3/uL — AB (ref 4.0–10.5)

## 2016-03-11 MED ORDER — POTASSIUM CHLORIDE CRYS ER 20 MEQ PO TBCR: 20.0000 meq | EXTENDED_RELEASE_TABLET | Freq: Two times a day (BID) | ORAL | 0 refills | Status: DC

## 2016-03-11 MED ORDER — HEPARIN SOD (PORK) LOCK FLUSH 100 UNIT/ML IV SOLN
INTRAVENOUS | Status: AC
  Filled 2016-03-11: qty 5

## 2016-03-11 MED ORDER — SODIUM CHLORIDE 0.9 % IV SOLN
INTRAVENOUS | Status: AC
  Administered 2016-03-11: 11:00:00 via INTRAVENOUS

## 2016-03-11 MED ORDER — HEPARIN SOD (PORK) LOCK FLUSH 100 UNIT/ML IV SOLN
500.0000 [IU] | Freq: Once | INTRAVENOUS | Status: AC
  Administered 2016-03-11: 500 [IU] via INTRAVENOUS

## 2016-03-11 MED ORDER — POTASSIUM CHLORIDE 20 MEQ/15ML (10%) PO SOLN
60.0000 meq | Freq: Once | ORAL | Status: AC
  Administered 2016-03-11: 60 meq via ORAL
  Filled 2016-03-11: qty 45

## 2016-03-11 MED ORDER — POTASSIUM CHLORIDE 10 MEQ/100ML IV SOLN
10.0000 meq | INTRAVENOUS | Status: AC
  Administered 2016-03-11 (×2): 10 meq via INTRAVENOUS
  Filled 2016-03-11 (×2): qty 100

## 2016-03-11 MED ORDER — POTASSIUM CHLORIDE CRYS ER 20 MEQ PO TBCR: 60.0000 meq | EXTENDED_RELEASE_TABLET | Freq: Once | ORAL | Status: DC

## 2016-03-11 NOTE — Progress Notes (Signed)
Patient presented to clinic with complaints of fatigue, nausea and feeling dehydrated. Lab work ordered as well as IV fluids for hydration.  Tolerated IV fluids and IV potassium well. States she feels much better than when she first got here today. Stable and ambulatory on discharge home with husband.

## 2016-03-11 NOTE — Patient Instructions (Signed)
[place] at [facility] Discharge Instructions  RECOMMENDATIONS MADE BY THE CONSULTANT AND ANY TEST RESULTS WILL BE SENT TO YOUR REFERRING PHYSICIAN.  IV fluids, IV potassium and oral potassium given today as ordered. Return as scheduled.  Thank you for choosing [place] at [facility] to provide your oncology and hematology care.  To afford each patient quality time with our provider, please arrive at least 15 minutes before your scheduled appointment time.    If you have a lab appointment with the [place] please come in thru the  Main Entrance and check in at the main information desk  You need to re-schedule your appointment should you arrive 10 or more minutes late.  We strive to give you quality time with our providers, and arriving late affects you and other patients whose appointments are after yours.  Also, if you no show three or more times for appointments you may be dismissed from the clinic at the providers discretion.     Again, thank you for choosing [facility].  Our hope is that these requests will decrease the amount of time that you wait before being seen by our physicians.       _____________________________________________________________  Should you have questions after your visit to [facility], please contact our office at [phone] between the hours of 8:30 a.m. and 4:30 p.m.  Voicemails left after 4:30 p.m. will not be returned until the following business day.  For prescription refill requests, have your pharmacy contact our office.       Resources For Cancer Patients and their Caregivers ? American Cancer Society: Can assist with transportation, wigs, general needs, runs Look Good Feel Better.        971-106-8253 ? Cancer Care: Provides financial assistance, online support groups, medication/co-pay assistance.  1-800-813-HOPE 5394583747) ? [place] Assists [place] cancer patients and their families through emotional , educational and financial support.  ([phone] ? [place] DSS Where to apply for food stamps, Medicaid and utility assistance. 7253182231 ? RCATS: Transportation to medical appointments. 412-129-6076 ? Social Security Administration: May apply for disability if have a Stage IV cancer. 267 346 0778 (586) [phone] ? [employer], Disability and Transit Services: Assists with nutrition, care and transit needs. [place] Support Programs: @10RELATIVEDAYS @ > Cancer Support Group  2nd Tuesday of the month 1pm-2pm, Journey Room  > Creative Journey  3rd Tuesday of the month 1130am-1pm, Journey Room  > Look Good Feel Better  1st Wednesday of the month 10am-12 noon, Journey Room (Call [place] to register [phone])

## 2016-03-18 ENCOUNTER — Other Ambulatory Visit (HOSPITAL_COMMUNITY): Payer: Self-pay | Admitting: Oncology

## 2016-03-18 DIAGNOSIS — C539 Malignant neoplasm of cervix uteri, unspecified: Secondary | ICD-10-CM

## 2016-03-18 MED ORDER — HYDROCODONE-ACETAMINOPHEN 5-325 MG PO TABS: 1.0000 | ORAL_TABLET | Freq: Four times a day (QID) | ORAL | 0 refills | Status: DC | PRN

## 2016-03-19 ENCOUNTER — Encounter (HOSPITAL_COMMUNITY): Payer: Medicaid - Out of State

## 2016-03-19 NOTE — Progress Notes (Signed)
Nutrition Follow-up:  Patient seen in clinic for nutrition follow-up this am.  Patient with metastatic squamous cell carcinoma of cervix.    Patient reports no nausea, vomiting. Had bout of constipation for about 2 days but otherwise appetite has been good.  Report biggest compliant is fatigue and feeling tired, also gets cold easily.    Reports that she has been really trying to eat small frequent meals.  Reports she always eats breakfast of eggs, bacon, then drinks boost and eats dry cereal. For lunch sometimes has sandwich or burger and every night always has a good dinner of protein and 2 vegetables.  Reports she snacks on dry cereal a lot.   Medications: reviewed  Labs: reveiwed  Anthropometrics:   Patient weight is stable at 87 pounds noted on 1/31 and 1/10.    NUTRITION DIAGNOSIS: Malnutrition continues   MALNUTRITION DIAGNOSIS: Moderate malnutrition continues   INTERVENTION:   Provided suggestions of higher calorie, higher protein snacks foods vs dry cereal for patient to try.  Teach back used. Patient never picked up 1st complimentary case of ensure in January so giving it to her today.    MONITORING, EVALUATION, GOAL: Patient will consume adequate calories and protein to maintain/gain weight   NEXT VISIT: March 23rd  Erika [name], [place], [place] (pager)

## 2016-03-24 ENCOUNTER — Ambulatory Visit (HOSPITAL_COMMUNITY): Payer: PRIVATE HEALTH INSURANCE | Admitting: Oncology

## 2016-03-24 ENCOUNTER — Ambulatory Visit (HOSPITAL_COMMUNITY): Payer: PRIVATE HEALTH INSURANCE

## 2016-03-30 ENCOUNTER — Other Ambulatory Visit (HOSPITAL_COMMUNITY): Payer: Self-pay | Admitting: Oncology

## 2016-03-30 ENCOUNTER — Encounter (HOSPITAL_BASED_OUTPATIENT_CLINIC_OR_DEPARTMENT_OTHER): Payer: Medicaid - Out of State | Admitting: Oncology

## 2016-03-30 ENCOUNTER — Encounter (HOSPITAL_COMMUNITY): Payer: Self-pay

## 2016-03-30 ENCOUNTER — Encounter (HOSPITAL_BASED_OUTPATIENT_CLINIC_OR_DEPARTMENT_OTHER): Payer: Medicaid - Out of State

## 2016-03-30 VITALS — BP 115/86 | HR 89 | Temp 98.3°F | Resp 16 | Wt 78.7 lb

## 2016-03-30 DIAGNOSIS — Z5112 Encounter for antineoplastic immunotherapy: Secondary | ICD-10-CM

## 2016-03-30 DIAGNOSIS — K5900 Constipation, unspecified: Secondary | ICD-10-CM

## 2016-03-30 DIAGNOSIS — N390 Urinary tract infection, site not specified: Secondary | ICD-10-CM

## 2016-03-30 DIAGNOSIS — G620 Drug-induced polyneuropathy: Secondary | ICD-10-CM

## 2016-03-30 DIAGNOSIS — Z5111 Encounter for antineoplastic chemotherapy: Secondary | ICD-10-CM | POA: Diagnosis not present

## 2016-03-30 DIAGNOSIS — R102 Pelvic and perineal pain: Secondary | ICD-10-CM | POA: Diagnosis not present

## 2016-03-30 DIAGNOSIS — C539 Malignant neoplasm of cervix uteri, unspecified: Secondary | ICD-10-CM

## 2016-03-30 DIAGNOSIS — C787 Secondary malignant neoplasm of liver and intrahepatic bile duct: Secondary | ICD-10-CM

## 2016-03-30 DIAGNOSIS — R109 Unspecified abdominal pain: Secondary | ICD-10-CM

## 2016-03-30 DIAGNOSIS — R3911 Hesitancy of micturition: Secondary | ICD-10-CM

## 2016-03-30 LAB — URINALYSIS, ROUTINE W REFLEX MICROSCOPIC
BILIRUBIN URINE: NEGATIVE
Glucose, UA: NEGATIVE mg/dL
Hgb urine dipstick: NEGATIVE
KETONES UR: NEGATIVE mg/dL
Nitrite: NEGATIVE
PH: 6 (ref 5.0–8.0)
PROTEIN: NEGATIVE mg/dL
Specific Gravity, Urine: 1.015 (ref 1.005–1.030)

## 2016-03-30 LAB — CBC WITH DIFFERENTIAL/PLATELET
Basophils Absolute: 0 10*3/uL (ref 0.0–0.1)
Basophils Relative: 0 %
EOS ABS: 0 10*3/uL (ref 0.0–0.7)
Eosinophils Relative: 0 %
HEMATOCRIT: 34.8 % — AB (ref 36.0–46.0)
HEMOGLOBIN: 11.3 g/dL — AB (ref 12.0–15.0)
LYMPHS ABS: 0.9 10*3/uL (ref 0.7–4.0)
Lymphocytes Relative: 14 %
MCH: 32.8 pg (ref 26.0–34.0)
MCHC: 32.5 g/dL (ref 30.0–36.0)
MCV: 101.2 fL — ABNORMAL HIGH (ref 78.0–100.0)
MONOS PCT: 9 %
Monocytes Absolute: 0.6 10*3/uL (ref 0.1–1.0)
NEUTROS ABS: 5.3 10*3/uL (ref 1.7–7.7)
NEUTROS PCT: 77 %
Platelets: 284 10*3/uL (ref 150–400)
RBC: 3.44 MIL/uL — AB (ref 3.87–5.11)
RDW: 18.8 % — ABNORMAL HIGH (ref 11.5–15.5)
WBC: 6.9 10*3/uL (ref 4.0–10.5)

## 2016-03-30 LAB — COMPREHENSIVE METABOLIC PANEL
ALBUMIN: 3.5 g/dL (ref 3.5–5.0)
ALT: 7 U/L — AB (ref 14–54)
AST: 14 U/L — ABNORMAL LOW (ref 15–41)
Alkaline Phosphatase: 89 U/L (ref 38–126)
Anion gap: 8 (ref 5–15)
BILIRUBIN TOTAL: 0.3 mg/dL (ref 0.3–1.2)
BUN: 11 mg/dL (ref 6–20)
CO2: 29 mmol/L (ref 22–32)
CREATININE: 0.53 mg/dL (ref 0.44–1.00)
Calcium: 9.4 mg/dL (ref 8.9–10.3)
Chloride: 98 mmol/L — ABNORMAL LOW (ref 101–111)
GFR calc non Af Amer: >60 mL/min (ref >60)
GLUCOSE: 93 mg/dL (ref 65–99)
Potassium: 3.9 mmol/L (ref 3.5–5.1)
SODIUM: 135 mmol/L (ref 135–145)
TOTAL PROTEIN: 8 g/dL (ref 6.5–8.1)

## 2016-03-30 MED ORDER — FAMOTIDINE IN NACL 20-0.9 MG/50ML-% IV SOLN
20.0000 mg | Freq: Once | INTRAVENOUS | Status: AC
  Administered 2016-03-30: 20 mg via INTRAVENOUS
  Filled 2016-03-30: qty 50

## 2016-03-30 MED ORDER — SODIUM CHLORIDE 0.9 % IV SOLN
Freq: Once | INTRAVENOUS | Status: AC
  Administered 2016-03-30: 12:00:00 via INTRAVENOUS

## 2016-03-30 MED ORDER — MORPHINE SULFATE ER 15 MG PO TBCR: 15.0000 mg | EXTENDED_RELEASE_TABLET | Freq: Two times a day (BID) | ORAL | 0 refills | Status: DC

## 2016-03-30 MED ORDER — CIPROFLOXACIN HCL 500 MG PO TABS: 500.0000 mg | ORAL_TABLET | Freq: Two times a day (BID) | ORAL | 0 refills | Status: DC

## 2016-03-30 MED ORDER — PEGFILGRASTIM 6 MG/0.6ML ~~LOC~~ PSKT
6.0000 mg | PREFILLED_SYRINGE | Freq: Once | SUBCUTANEOUS | Status: AC
  Administered 2016-03-30: 6 mg via SUBCUTANEOUS
  Filled 2016-03-30: qty 0.6

## 2016-03-30 MED ORDER — SODIUM CHLORIDE 0.9 % IV SOLN
420.0000 mg | Freq: Once | INTRAVENOUS | Status: AC
  Administered 2016-03-30: 420 mg via INTRAVENOUS
  Filled 2016-03-30: qty 42

## 2016-03-30 MED ORDER — SODIUM CHLORIDE 0.9 % IV SOLN
20.0000 mg | Freq: Once | INTRAVENOUS | Status: AC
  Administered 2016-03-30: 20 mg via INTRAVENOUS
  Filled 2016-03-30: qty 2

## 2016-03-30 MED ORDER — SODIUM CHLORIDE 0.9 % IV SOLN
550.0000 mg | Freq: Once | INTRAVENOUS | Status: AC
  Administered 2016-03-30: 550 mg via INTRAVENOUS
  Filled 2016-03-30: qty 6

## 2016-03-30 MED ORDER — PACLITAXEL CHEMO INJECTION 300 MG/50ML
175.0000 mg/m2 | Freq: Once | INTRAVENOUS | Status: AC
  Administered 2016-03-30: 234 mg via INTRAVENOUS
  Filled 2016-03-30: qty 39

## 2016-03-30 MED ORDER — HEPARIN SOD (PORK) LOCK FLUSH 100 UNIT/ML IV SOLN
500.0000 [IU] | Freq: Once | INTRAVENOUS | Status: AC | PRN
  Administered 2016-03-30: 500 [IU]

## 2016-03-30 MED ORDER — DIPHENHYDRAMINE HCL 50 MG/ML IJ SOLN
50.0000 mg | Freq: Once | INTRAMUSCULAR | Status: AC
  Administered 2016-03-30: 50 mg via INTRAVENOUS
  Filled 2016-03-30: qty 1

## 2016-03-30 MED ORDER — HYDROCODONE-ACETAMINOPHEN 5-325 MG PO TABS: 1.0000 | ORAL_TABLET | Freq: Four times a day (QID) | ORAL | 0 refills | Status: DC | PRN

## 2016-03-30 MED ORDER — PALONOSETRON HCL INJECTION 0.25 MG/5ML
0.2500 mg | Freq: Once | INTRAVENOUS | Status: AC
  Administered 2016-03-30: 0.25 mg via INTRAVENOUS
  Filled 2016-03-30: qty 5

## 2016-03-30 NOTE — Progress Notes (Signed)
[place]  PROGRESS NOTE  Patient Care Team: No Pcp Per Patient as PCP - General (General Practice)  CHIEF COMPLAINTS/PURPOSE OF CONSULTATION:    Recurrent cervical cancer ([place])   02/16/2014 Procedure    Cervical biopsy by Dr. Denman George      02/18/2014 Pathology Results    Cervix, biopsy - SQUAMOUS CELL CARCINOMA.      03/07/2014 - 04/24/2014 Radiation Therapy    Dr. Sondra Come- vaginal brachytherapy, 45 Gy to pelvis with sidewall boost 9 Gy and HDR 28.5 Gy in 5 fractions      03/08/2014 - 04/03/2014 Chemotherapy    Weekly CDDP 40 mg/m2 by Dr. 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      07/12/2014 PET scan    Marked interval decrease in size and hypermetabolism of the cervical mass.  The bilateral hypermetabolic pelvic sidewall lymphadenopathy has resolved in the interval.  Areas of hypermetabolic brown fat bilaterally in the chest and abdomen.      11/05/2015 Imaging    MRI abdomen at [facility]- 7.0 x 5.3 cm are in left hepatic lobe with central 3.1 x 2.7 x 2.3 cm complex cystic area, as well as an apparent subcapsular foci      11/26/2015 Procedure    US aspiration of left hepatic lobe- yielding 13 cc of yellow purulent fluid (pain resolved and PO intake increased)      11/26/2015 Pathology Results    [id number] highly suspicious for squamous cell carcinoma      12/09/2015 PET scan    New small hypermetabolic right internal mammary soft tissue density, suspicious for metastatic lymphadenopathy.  Increased size and number of hypermetabolic liver metastases. New mild hypermetabolic activity in porta hepatis is suspicious for lymph node metastases.  No residual hypermetabolic cervical mass identified. Multiple uterine fibroids again seen without FDG uptake.  New diffuse rectal wall thickening with hypermetabolic activity, suspicious for proctitis which could be infectious in  etiology or secondary to radiation changes ; recommend clinical correlation. Small pelvic fluid collection between the anterior rectal wall and posterior uterine wall, possibly due to abscess.    01/21/2016 - (End Date) Chemotherapy The patient had palonosetron (ALOXI) injection 0.25 mg, 0.25 mg, Intravenous,  Once, 0 of 4 cycles  bevacizumab (AVASTIN) 625 mg in sodium chloride 0.9 % 100 mL chemo infusion, 15 mg/kg = 625 mg (100 % of original dose 15 mg/kg), Intravenous,  Once, 0 of 4 cycles Dose modification: 15 mg/kg (original dose 15 mg/kg, Cycle 1, Reason: Provider Judgment)  CARBOplatin (PARAPLATIN) in sodium chloride 0.9 % 100 mL chemo infusion,  (original dose ), Intravenous,  Once, 0 of 4 cycles Dose modification:   (Cycle 1)  PACLitaxel (TAXOL) 234 mg in dextrose 5 % 250 mL chemo infusion (> 80mg /m2), 175 mg/m2 = 234 mg, Intravenous,  Once, 0 of 4 cycles  for chemotherapy treatment.       HISTORY OF PRESENTING ILLNESS:  Erika [name] 41 y.o. female is here because of referral from Dr. Marko Plume for ongoing follow-up of metastatic squamous cell carcinoma of cervix.  Per Dr. Mariana Kaufman 12/12/2015 note: "Patient was initially diagnosed with squamous cell carcinoma of cervix when she presented to [facility] ED with vaginal hemorrhage on 01-30-14. She was seen by Dr Evie Lacks, with cervical mass identified and high grade cervical dysplasia on biopsy. She was referred to Dr Denman George, with cervical biopsy 02-16-14 [id number]) invasive squamous cell  carcinoma, clinical stage IIB with involvement of right parametria. PET 02-25-14 additionally had uptake in bilateral pelvic nodes. As patient lives in [place], [place], she was treated with radiation by Dr Sondra Come in [place], given with sensitizing weekly CDDP by Dr Everardo All. Peripheral venous access was not adequate for chemo, such that she had PAC by Dr R.Cathey (removed after that treatment completed). Chemo was weekly CDDP 40 mg/m2 from 03-08-14  thru 04-03-14. Radiation was external beam followed by vaginal brachytherapy, 45 Gy to pelvis with sidewall boost 9 Gy and HDR 28.5 Gy in 5 fractions, from 03-07-14 thru 04-24-14.  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 She used zofran for chemo nausea, which was severe particularly with first few CDDP treatments. She has been postmenopausal since radiation/ chemo, on prempro by gyn oncology. She had radiation diarrhea, which resolved after treatment, baseline then formed bowel movements twice daily until recent different rectal symptoms.  Patient has been followed with alternating visits by Drs Denman George (04-2015), Kinard (07-2015) and [place]. She felt well until last few months, when she had new localized pain RUQ below ribs anteriorly. Additionally she had decreased appetite with some nausea, weight loss of 8 lbs in 4 months, early satiety, increased night sweats without fever or chills, and now ~ 2 weeks of rectal discomfort with tenesmus/ pressure.  The RUQ pain progressively increased until so severe that she was unable to tolerate, at which time she was seen at [facility] ED.  MR/ MRA abdomen at [facility] 11-05-15 showed 7.0 x 5.3 cm area in left hepatic lobe with central 3.1 x 2.7 x 2.3 cm central complex cystic area, as well as apparent subcapsular foci. US aspiration of the left hepatic lobe 11-26-15 yielded 13 cc of yellow purulent fluid, after which pain improved and po intake easier; pathology [id number] highly suspicious for squamous cell carcinoma, with aerobic and anaerobic cultures no growth. PET 12-09-15 compared with 07-12-14 and other imaging showed new 1.2 cm right parasternal/ internal mammary soft tissue, no pulmonary nodules or chest adenopathy, right hepatic lobe mass 5.1 x 7 cm with SUV 20.7 (previously SUV 5.3 with no CT correlate 07-2014), small hypermetabolic lesions along capsular surface lateral  right hepatic lobe, uptake in porta hepatis, no other hypermetabolic nodes abdomen/ pelvis. PET also shows new hypermetabolic activity in rectum with SUV 20.2 with diffuse rectal wall thickening and irregular low attenuation area between anterior rectum and posterior uterine wall 2.5 x 4.9 cm "suspicious for abscess" per radiologist.  Patient has discussed situation with Dr Denman George by phone, with recommendation for systemic treatment with carboplatin taxol avastin."  Ms. Pelowski presents to [facility] today accompanied by her daughter and boyfriend  for follow up. She is in a treatment bed.   She states she lost a few more pounds. She notes she is eating well and is on ensure but still losing weight. She drinks about 2-3 Ensures a day. She states she eats snacks, cookies, peanut butter throughout the day.   She notes she has been feeling sick since her treatment with diarrhea one day and constipation the other. She notes that she is starting to feel better now. The nurse states she is staying constipated. She is taking stool softeners and last week she only had a bowel movement 2 times.   She notes she still has lower abdominal pain and vaginal pain. She states she has a burning in the bottom of  her abdomen since the last 3-4 days but does not currently have any burning abdominal pain.  She notes the pain is unchanged from before. She notes she doesn't go anywhere because some days she cant sit comfortably in the car due to her abdominal pain.  Denies chest pain, sob, dysuria.  She requested a refill on her [place].    MEDICAL HISTORY:  Past Medical History:  Diagnosis Date  . Anxiety   . Cervical cancer ([place])   . Cervical cancer, FIGO stage IIB ([place]) 04/09/2014  . Family history of adverse reaction to anesthesia    pt states her sister has to have the "reversal" after surgery   . Hypokalemia   . Microcytic hypochromic anemia   . Recurrent cervical cancer ([place]) 12/12/2015    SURGICAL  HISTORY: Past Surgical History:  Procedure Laterality Date  . CESAREAN SECTION    . port a cath placement      January 2016 / right   . TANDEM RING INSERTION N/A 04/09/2014   Procedure: TANDEM RING PLACEMENT FOR HIGH DOSE RATE RADIATION THERAPY EXAM UNDER ANETHESIA PLACEMENT OF CERVICAL SLEEVE;  Surgeon: Gery Pray, MD;  Location: [facility];  Service: Urology;  Laterality: N/A;  . TANDEM RING INSERTION N/A 04/23/2014   Procedure: TANDEM RING PLACEMENT ;  Surgeon: Gery Pray, MD;  Location: [facility];  Service: Urology;  Laterality: N/A;  . TANDEM RING INSERTION N/A 04/30/2014   Procedure: TANDEM RING PLACEMENT,EXAM UNDER ANESTHESIA ;  Surgeon: Gery Pray, MD;  Location: [facility];  Service: Urology;  Laterality: N/A;  . TANDEM RING INSERTION N/A 05/07/2014   Procedure: TANDEM RING PLACEMENT;  Surgeon: Gery Pray, MD;  Location: [facility];  Service: Urology;  Laterality: N/A;  . TANDEM RING INSERTION N/A 05/17/2014   Procedure: TANDEM RING INSERTION;  Surgeon: Gery Pray, MD;  Location: [facility];  Service: Urology;  Laterality: N/A;  . TUBAL LIGATION      SOCIAL HISTORY: Social History   Social History  . Marital status: Single    Spouse name: N/A  . Number of children: 2  . Years of education: N/A   Occupational History  . cook at [employer] History Main Topics  . Smoking status: Former Smoker    Packs/day: 0.50    Years: 6.00    Types: Cigarettes    Quit date: 08/02/2011  . Smokeless tobacco: Never Used  . Alcohol use No  . Drug use: Yes    Types: Marijuana  . Sexual activity: Yes   Other Topics Concern  . Not on file   Social History Narrative  . No narrative on file   She lives in [place] with her boyfriend and youngest daughter, who is 53 yo. Her boyfriend does not work but receives disability. The patient does not currently work. Her oldest daughter is 53 yo and lives outside of the home. The patient is not sure how disability works as she has  always worked until now.  She previously worked in [employer] She has a Magazine features editor, Dealer Non smoker  FAMILY HISTORY: Family History  Problem Relation Age of Onset  . Asthma Mother   . Heart attack Father    Mother deceased of an asthma attack at 34 yo Father deceased of a heart attack in his 110's One sister in her early 74's. She has blood clots. She had one in her leg which caused cellulitis. She has a work-related disc injury in her back.  ALLERGIES:  has No Known Allergies.  MEDICATIONS:  Current Outpatient Prescriptions  Medication Sig Dispense Refill  . BEVACIZUMAB IV Inject into the vein.    . calcium-vitamin D (OSCAL 500/200 D-3) 500-200 MG-UNIT per tablet Take 1 tablet by mouth 2 (two) times daily. 60 tablet 11  . CARBOPLATIN IV Inject into the vein.    Marland Kitchen dexamethasone (DECADRON) 4 MG tablet Take 2 tablets (8 mg total) by mouth daily. Start the day after chemotherapy for 2 days. 30 tablet 1  . estrogen, conjugated,-medroxyprogesterone (PREMPRO) 0.3-1.5 MG per tablet Take 1 tablet by mouth daily. 30 tablet 11  . ferrous sulfate 325 (65 FE) MG tablet Take 325 mg by mouth daily with breakfast.    . HYDROcodone-acetaminophen (NORCO/VICODIN) 5-325 MG tablet Take 1-2 tablets by mouth every 6 (six) hours as needed for moderate pain. 60 tablet 0  . lidocaine-prilocaine (EMLA) cream Apply to affected area once (Patient taking differently: Apply 1 application topically daily as needed. Apply to affected area once) 30 g 3  . LORazepam (ATIVAN) 1 MG tablet TAKE 1 TABLET BY MOUTH AT BEDTIME AS NEEDED FOR ANXIETY /INSOMNIA 30 tablet 1  . morphine (MS CONTIN) 15 MG 12 hr tablet Take 1 tablet (15 mg total) by mouth every 12 (twelve) hours. 60 tablet 0  . naproxen sodium (ANAPROX) 220 MG tablet Take 220 mg by mouth 4 (four) times daily as needed.    . ondansetron (ZOFRAN) 8 MG tablet Take 1 tablet (8 mg total) by mouth 2 (two) times daily as needed for refractory nausea / vomiting. Start on day 3  after chemo. 30 tablet 1  . PACLITAXEL IV Inject into the vein.    . potassium chloride SA (K-DUR,KLOR-CON) 20 MEQ tablet Take 1 tablet (20 mEq total) by mouth 2 (two) times daily. 60 tablet 0  . prochlorperazine (COMPAZINE) 10 MG tablet Take 1 tablet (10 mg total) by mouth every 6 (six) hours as needed (Nausea or vomiting). 30 tablet 1   No current facility-administered medications for this visit.      Review of Systems  Constitutional: Negative.   HENT: Negative.   Eyes: Negative.   Respiratory: Negative.  Negative for shortness of breath.   Cardiovascular: Negative.  Negative for chest pain.  Gastrointestinal: Positive for abdominal pain (with burning in lower abdomen), constipation (taking stool softeners) and diarrhea.       Vaginal pain  Genitourinary: Negative.  Negative for dysuria.  Musculoskeletal: Negative.   Skin: Negative.   Neurological: Negative.   Endo/Heme/Allergies: Negative.   Psychiatric/Behavioral: Negative.   All other systems reviewed and are negative. 14 point ROS was done and is otherwise as detailed above or in HPI   PHYSICAL EXAMINATION: ECOG PERFORMANCE STATUS: 1 - Symptomatic but completely ambulatory  There were no vitals filed for this visit. There were no vitals filed for this visit.   Vitals with BMI 03/30/2016  Height   Weight 78 lbs 11 oz  BMI   Systolic 123456  Diastolic 86  Pulse [id number]  Respirations 16     Physical Exam  Constitutional: She is oriented to person, place, and time and well-developed, well-nourished, and in no distress.  Physical exam was done in a treatment bed  HENT:  Head: Normocephalic and atraumatic.  Nose: Nose normal.  Mouth/Throat: Oropharynx is clear and moist. No oropharyngeal exudate.  Eyes: Conjunctivae and EOM are normal. Pupils are equal, round, and reactive to light. Right eye exhibits no discharge. Left eye exhibits no discharge.  No scleral icterus.  Neck: Normal range of motion. Neck supple. No tracheal  deviation present. No thyromegaly present.  Cardiovascular: Normal rate, regular rhythm and normal heart sounds.  Exam reveals no gallop and no friction rub.   No murmur heard. Pulmonary/Chest: Effort normal and breath sounds normal. She has no wheezes. She has no rales.  Abdominal: Soft. Bowel sounds are normal. She exhibits no distension and no mass. There is no tenderness. There is no rebound and no guarding.  Suprapubic TTP  Musculoskeletal: Normal range of motion. She exhibits no edema.  Lymphadenopathy:    She has no cervical adenopathy.  Neurological: She is alert and oriented to person, place, and time. She has normal reflexes. No cranial nerve deficit. Gait normal. Coordination normal.  Skin: Skin is warm and dry. No rash noted.  Psychiatric: Mood, memory, affect and judgment normal.  Nursing note and vitals reviewed.   LABORATORY DATA:  I have reviewed the data as listed Lab Results  Component Value Date   WBC 6.9 03/30/2016   HGB 11.3 (L) 03/30/2016   HCT 34.8 (L) 03/30/2016   MCV 101.2 (H) 03/30/2016   PLT 284 03/30/2016   CMP     Component Value Date/Time   NA 134 (L) 03/11/2016 1047   NA 140 12/12/2015 0920   K 2.9 (L) 03/11/2016 1047   K 4.0 12/12/2015 0920   CL 92 (L) 03/11/2016 1047   CO2 32 03/11/2016 1047   CO2 26 12/12/2015 0920   GLUCOSE 97 03/11/2016 1047   GLUCOSE 87 12/12/2015 0920   BUN 14 03/11/2016 1047   BUN 8.4 12/12/2015 0920   CREATININE 0.57 03/11/2016 1047   CREATININE 0.7 12/12/2015 0920   CALCIUM 9.4 03/11/2016 1047   CALCIUM 10.5 (H) 12/12/2015 0920   PROT 7.3 03/11/2016 1047   PROT 9.3 (H) 12/12/2015 0920   ALBUMIN 3.4 (L) 03/11/2016 1047   ALBUMIN 3.2 (L) 12/12/2015 0920   AST 15 03/11/2016 1047   AST 15 12/12/2015 0920   ALT 9 (L) 03/11/2016 1047   ALT 8 12/12/2015 0920   ALKPHOS 137 (H) 03/11/2016 1047   ALKPHOS 148 12/12/2015 0920   BILITOT 0.3 03/11/2016 1047   BILITOT 0.35 12/12/2015 0920   GFRNONAA >60 03/11/2016 1047     GFRAA >60 03/11/2016 1047   PATHOLOGY   RADIOGRAPHIC STUDIES: I have personally reviewed the radiological images as listed and agreed with the findings in the report.  DG ABD 2 Views 02/05/2016 IMPRESSION: 1. Moderately large amount of feces throughout the colon. 2. No bowel obstruction or free air .  ASSESSMENT & PLAN:  Cancer Staging Recurrent cervical cancer ([place]) Staging form: Cervix Uteri, AJCC 7th Edition - Clinical stage from 12/12/2015: FIGO Stage IVB (T2b, N1, M1) - Signed by Gordy Levan, MD on 12/12/2015  No problem-specific Assessment & Plan notes found for this encounter. Liver metastases Rectal wall thickening/hypermetabolism on PET, negative biopsy Colonoscopy with Dr. Ladona Horns Unintentional Weight Loss Chemotherapy induced neuropathy Hx stage III cervical cancer Chemotherapy induced constipation  Continue with cycle 4 of carbo/taxol/avastin today. Patient is tolerating chemotherapy well.  I advised her to drink at least 3 Ensures a day.  I will set her up for hydration twice a week.  Pain is poorly controlled. Added MS contin 15 mg PO BID for basal pain control. Refilled norco today, I have advised patient to take 1-2 tabs as needed q6h for breakthrough pain.   Advised her to use miralax daily for constipation, especially with  the increase in pain meds.  I have ordered restaging PET-CT to assess response to treatment to be performed prior to her next visit.  RTC in  3 weeks with repeat PET.    MEDICATIONS PRESCRIBED THIS ENCOUNTER: Meds ordered this encounter  Medications  . HYDROcodone-acetaminophen (NORCO/VICODIN) 5-325 MG tablet    Sig: Take 1-2 tablets by mouth every 6 (six) hours as needed for moderate pain.    Dispense:  60 tablet    Refill:  0  . morphine (MS CONTIN) 15 MG 12 hr tablet    Sig: Take 1 tablet (15 mg total) by mouth every 12 (twelve) hours.    Dispense:  60 tablet    Refill:  0   All questions were answered. The patient  knows to call the clinic with any problems, questions or concerns.  This document serves as a record of services personally performed by Twana First, MD. It was created on her behalf by Shirlean Mylar, a trained medical scribe. The creation of this record is based on the scribe's personal observations and the provider's statements to them. This document has been checked and approved by the attending provider.  I have reviewed the above documentation for accuracy and completeness and I agree with the above.  This note was electronically signed.    [employer]  03/30/2016 10:24 AM

## 2016-03-30 NOTE — Progress Notes (Signed)
Patient has been unable to void since check in. Per Dr.Zhou ok to treat with avastin and send urine specimen whenever available.  Tolerated chemo well. Erika KitchenMason [name] arrived today for ONPRO neulasta on body injector. See MAR for administration details. Injector in place and engaged with green light indicator on flashing. Tolerated application with out problems.  Stable and ambulatory on discharge home with family.

## 2016-03-30 NOTE — Patient Instructions (Signed)
[facility] Discharge Instructions  RECOMMENDATIONS MADE BY THE CONSULTANT AND ANY TEST RESULTS WILL BE SENT TO YOUR REFERRING PHYSICIAN.  You were seen today by Dr. Twana First We will be scheduling you for IV fluids twice weekly You will have PET scan in the next week Follow up in 3 weeks See Amy up front for appointments   Thank you for choosing [place] at [facility] to provide your oncology and hematology care.  To afford each patient quality time with our provider, please arrive at least 15 minutes before your scheduled appointment time.    If you have a lab appointment with the [facility] please come in thru the  Main Entrance and check in at the main information desk  You need to re-schedule your appointment should you arrive 10 or more minutes late.  We strive to give you quality time with our providers, and arriving late affects you and other patients whose appointments are after yours.  Also, if you no show three or more times for appointments you may be dismissed from the clinic at the providers discretion.     Again, thank you for choosing [facility].  Our hope is that these requests will decrease the amount of time that you wait before being seen by our physicians.       _____________________________________________________________  Should you have questions after your visit to [facility], please contact our office at [phone] between the hours of 8:30 a.m. and 4:30 p.m.  Voicemails left after 4:30 p.m. will not be returned until the following business day.  For prescription refill requests, have your pharmacy contact our office.       Resources For Cancer Patients and their Caregivers ? American Cancer Society: Can assist with transportation, wigs, general needs, runs Look Good Feel Better.        910-711-6928 ? Cancer Care: Provides financial assistance, online support  groups, medication/co-pay assistance.  1-800-813-HOPE 440-678-7411) ? [place] Assists [place] cancer patients and their families through emotional , educational and financial support.  762-514-7820 ? [place] DSS Where to apply for food stamps, Medicaid and utility assistance. 803-766-8432 ? [facility]: Transportation to medical appointments. 732 005 8648 ? Social Security Administration: May apply for disability if have a Stage IV cancer. 346-585-8851 684-348-0444 ? [employer], Disability and Transit Services: Assists with nutrition, care and transit needs. [place] Support Programs: @10RELATIVEDAYS @ > Cancer Support Group  2nd Tuesday of the month 1pm-2pm, Journey Room  > Creative Journey  3rd Tuesday of the month 1130am-1pm, Journey Room  > Look Good Feel Better  1st Wednesday of the month 10am-12 noon, Journey Room (Call [place] to register [phone])

## 2016-03-30 NOTE — Patient Instructions (Signed)
[facility] Discharge Instructions for Patients Receiving Chemotherapy   Beginning January 23rd 2017 lab work for the [facility] will be done in the  Main lab at [facility] on 1st floor. If you have a lab appointment with the [facility] please come in thru the  Main Entrance and check in at the main information desk   Today you received the following chemotherapy agents avastin, taxol and carbo. Neulasta device will dispense medication between 7 pm and 8 pm tomorrow. You may take device off after 8 pm tomorrow Wed Feb 28. You have a urinary tract infection. A prescription for cipro was sent to your pharmacy. You need to take all of this drug as directed.  To help prevent nausea and vomiting after your treatment, we encourage you to take your nausea medication as instructed.    If you develop nausea and vomiting, or diarrhea that is not controlled by your medication, call the clinic.  The clinic phone number is [phone]. Office hours are Monday-Friday 8:30am-5:00pm.  BELOW ARE SYMPTOMS THAT SHOULD BE REPORTED IMMEDIATELY:  *FEVER GREATER THAN 101.0 F  *CHILLS WITH OR WITHOUT FEVER  NAUSEA AND VOMITING THAT IS NOT CONTROLLED WITH YOUR NAUSEA MEDICATION  *UNUSUAL SHORTNESS OF BREATH  *UNUSUAL BRUISING OR BLEEDING  TENDERNESS IN MOUTH AND THROAT WITH OR WITHOUT PRESENCE OF ULCERS  *URINARY PROBLEMS  *BOWEL PROBLEMS  UNUSUAL RASH Items with * indicate a potential emergency and should be followed up as soon as possible. If you have an emergency after office hours please contact your primary care physician or go to the nearest emergency department.  Please call the clinic during office hours if you have any questions or concerns.   You may also contact the Patient Navigator at [phone] should you have any questions or need assistance in obtaining follow up care.      Resources For Cancer Patients and their Caregivers ? American Cancer  Society: Can assist with transportation, wigs, general needs, runs Look Good Feel Better.        8150752037 ? Cancer Care: Provides financial assistance, online support groups, medication/co-pay assistance.  1-800-813-HOPE 979-813-7069) ? [place] Assists [place] cancer patients and their families through emotional , educational and financial support.  ([phone] ? [place] DSS Where to apply for food stamps, Medicaid and utility assistance. 6605944123 ? RCATS: Transportation to medical appointments. 425-679-7269 ? Social Security Administration: May apply for disability if have a Stage IV cancer. [phone] 703-274-1601 ? [employer], Disability and Transit Services: Assists with nutrition, care and transit needs. [phone]

## 2016-03-31 LAB — CA 125: CA 125: 8.7 U/mL (ref 0.0–38.1)

## 2016-04-01 ENCOUNTER — Telehealth (HOSPITAL_COMMUNITY): Payer: Self-pay

## 2016-04-01 ENCOUNTER — Other Ambulatory Visit (HOSPITAL_COMMUNITY): Payer: Self-pay

## 2016-04-01 NOTE — Telephone Encounter (Signed)
Patient called stating she thinks the Cipro is causing her stomach pain and diarrhea. Reviewed with MD. Patient just had chemo yesterday. MD thinks the chemo is causing her stomach issues and not the Cipro. Called and notified patient. She states she has medicine for nausea, diarrhea, constipation, etc. Instructed her to keep taking the Cipro. Patient verbalized understanding.

## 2016-04-02 ENCOUNTER — Encounter (HOSPITAL_COMMUNITY): Payer: Self-pay

## 2016-04-02 ENCOUNTER — Encounter (HOSPITAL_COMMUNITY): Payer: Medicaid - Out of State | Attending: Oncology

## 2016-04-02 ENCOUNTER — Other Ambulatory Visit (HOSPITAL_COMMUNITY): Payer: Self-pay | Admitting: Oncology

## 2016-04-02 VITALS — BP 108/62 | HR 111 | Temp 97.9°F | Resp 16

## 2016-04-02 DIAGNOSIS — Z87891 Personal history of nicotine dependence: Secondary | ICD-10-CM | POA: Insufficient documentation

## 2016-04-02 DIAGNOSIS — C539 Malignant neoplasm of cervix uteri, unspecified: Secondary | ICD-10-CM | POA: Insufficient documentation

## 2016-04-02 DIAGNOSIS — E876 Hypokalemia: Secondary | ICD-10-CM | POA: Insufficient documentation

## 2016-04-02 DIAGNOSIS — C787 Secondary malignant neoplasm of liver and intrahepatic bile duct: Secondary | ICD-10-CM

## 2016-04-02 DIAGNOSIS — E860 Dehydration: Secondary | ICD-10-CM

## 2016-04-02 DIAGNOSIS — R197 Diarrhea, unspecified: Secondary | ICD-10-CM

## 2016-04-02 DIAGNOSIS — N3000 Acute cystitis without hematuria: Secondary | ICD-10-CM

## 2016-04-02 DIAGNOSIS — K769 Liver disease, unspecified: Secondary | ICD-10-CM | POA: Insufficient documentation

## 2016-04-02 DIAGNOSIS — F419 Anxiety disorder, unspecified: Secondary | ICD-10-CM | POA: Insufficient documentation

## 2016-04-02 DIAGNOSIS — R110 Nausea: Secondary | ICD-10-CM

## 2016-04-02 MED ORDER — HEPARIN SOD (PORK) LOCK FLUSH 100 UNIT/ML IV SOLN
500.0000 [IU] | Freq: Once | INTRAVENOUS | Status: AC
  Administered 2016-04-02: 500 [IU] via INTRAVENOUS

## 2016-04-02 MED ORDER — SODIUM CHLORIDE 0.9 % IV SOLN
INTRAVENOUS | Status: DC
  Administered 2016-04-02: 10:00:00 via INTRAVENOUS

## 2016-04-02 MED ORDER — NITROFURANTOIN MONOHYD MACRO 100 MG PO CAPS: 100.0000 mg | ORAL_CAPSULE | Freq: Two times a day (BID) | ORAL | 0 refills | Status: DC

## 2016-04-02 MED ORDER — HEPARIN SOD (PORK) LOCK FLUSH 100 UNIT/ML IV SOLN
INTRAVENOUS | Status: AC
  Filled 2016-04-02: qty 5

## 2016-04-02 NOTE — Progress Notes (Signed)
Patient reports that she has burning in her abdomen when she takes her Cipro.  She is taking it with food, but still has this burning sensation shortly after taking the medication.  Robynn Pane, PA aware and changed her antibiotic, patient aware and will start new medication today.  Patient tolerated infusion well.  VSS.  Patient ambulatory and stable upon discharge from clinic.

## 2016-04-02 NOTE — Patient Instructions (Signed)
[facility] at [facility] Discharge Instructions  RECOMMENDATIONS MADE BY THE CONSULTANT AND ANY TEST RESULTS WILL BE SENT TO YOUR REFERRING PHYSICIAN.  IV fluids today  Thank you for choosing [facility] at [facility] to provide your oncology and hematology care.  To afford each patient quality time with our provider, please arrive at least 15 minutes before your scheduled appointment time.    If you have a lab appointment with the Cancer Center please come in thru the  Main Entrance and check in at the main information desk  You need to re-schedule your appointment should you arrive 10 or more minutes late.  We strive to give you quality time with our providers, and arriving late affects you and other patients whose appointments are after yours.  Also, if you no show three or more times for appointments you may be dismissed from the clinic at the providers discretion.     Again, thank you for choosing [facility].  Our hope is that these requests will decrease the amount of time that you wait before being seen by our physicians.       _____________________________________________________________  Should you have questions after your visit to [facility], please contact our office at [phone] between the hours of 8:30 a.m. and 4:30 p.m.  Voicemails left after 4:30 p.m. will not be returned until the following business day.  For prescription refill requests, have your pharmacy contact our office.       Resources For Cancer Patients and their Caregivers ? American Cancer Society: Can assist with transportation, wigs, general needs, runs Look Good Feel Better.        1-888-227-6333 ? Cancer Care: Provides financial assistance, online support groups, medication/co-pay assistance.  1-800-813-HOPE (4673) ? [facility] Assists [place] cancer patients and their families through emotional  , educational and financial support.  336-427-4357 ? [place] DSS Where to apply for food stamps, Medicaid and utility assistance. 336-342-1394 ? RCATS: Transportation to medical appointments. 336-347-2287 ? Social Security Administration: May apply for disability if have a Stage IV cancer. 336-342-7796 1-800-772-1213 ? [place] Aging, Disability and Transit Services: Assists with nutrition, care and transit needs. 336-349-2343  Cancer Center Support Programs: @10RELATIVEDAYS@ > Cancer Support Group  2nd Tuesday of the month 1pm-2pm, Journey Room  > Creative Journey  3rd Tuesday of the month 1130am-1pm, Journey Room  > Look Good Feel Better  1st Wednesday of the month 10am-12 noon, Journey Room (Call American Cancer Society to register 1-800-395-5775)    

## 2016-04-06 ENCOUNTER — Encounter (HOSPITAL_BASED_OUTPATIENT_CLINIC_OR_DEPARTMENT_OTHER): Payer: Medicaid - Out of State

## 2016-04-06 VITALS — BP 122/81 | HR 73 | Temp 98.3°F | Resp 18 | Wt 77.6 lb

## 2016-04-06 DIAGNOSIS — C787 Secondary malignant neoplasm of liver and intrahepatic bile duct: Secondary | ICD-10-CM | POA: Diagnosis not present

## 2016-04-06 DIAGNOSIS — C539 Malignant neoplasm of cervix uteri, unspecified: Secondary | ICD-10-CM

## 2016-04-06 DIAGNOSIS — R197 Diarrhea, unspecified: Secondary | ICD-10-CM | POA: Diagnosis not present

## 2016-04-06 DIAGNOSIS — E639 Nutritional deficiency, unspecified: Secondary | ICD-10-CM

## 2016-04-06 MED ORDER — HEPARIN SOD (PORK) LOCK FLUSH 100 UNIT/ML IV SOLN
500.0000 [IU] | Freq: Once | INTRAVENOUS | Status: AC | PRN
  Administered 2016-04-06: 500 [IU]

## 2016-04-06 MED ORDER — SODIUM CHLORIDE 0.9 % IV SOLN
Freq: Once | INTRAVENOUS | Status: AC
  Administered 2016-04-06: 12:00:00 via INTRAVENOUS

## 2016-04-06 NOTE — Progress Notes (Signed)
Tolerated hydration fluids well. Stable and ambulatory on discharge home with daughter.

## 2016-04-06 NOTE — Patient Instructions (Signed)
[facility] at [facility] Discharge Instructions  RECOMMENDATIONS MADE BY THE CONSULTANT AND ANY TEST RESULTS WILL BE SENT TO YOUR REFERRING PHYSICIAN.  Hydration fluids given today as ordered. Return as scheduled.  Thank you for choosing [facility] at [facility] to provide your oncology and hematology care.  To afford each patient quality time with our provider, please arrive at least 15 minutes before your scheduled appointment time.    If you have a lab appointment with the Cancer Center please come in thru the  Main Entrance and check in at the main information desk  You need to re-schedule your appointment should you arrive 10 or more minutes late.  We strive to give you quality time with our providers, and arriving late affects you and other patients whose appointments are after yours.  Also, if you no show three or more times for appointments you may be dismissed from the clinic at the providers discretion.     Again, thank you for choosing  Cancer Center.  Our hope is that these requests will decrease the amount of time that you wait before being seen by our physicians.       _____________________________________________________________  Should you have questions after your visit to  Cancer Center, please contact our office at [phone] between the hours of 8:30 a.m. and 4:30 p.m.  Voicemails left after 4:30 p.m. will not be returned until the following business day.  For prescription refill requests, have your pharmacy contact our office.       Resources For Cancer Patients and their Caregivers ? American Cancer Society: Can assist with transportation, wigs, general needs, runs Look Good Feel Better.        1-888-227-6333 ? Cancer Care: Provides financial assistance, online support groups, medication/co-pay assistance.  1-800-813-HOPE (4673) ? [facility] Assists [place] cancer  patients and their families through emotional , educational and financial support.  336-427-4357 ? [place] DSS Where to apply for food stamps, Medicaid and utility assistance. 336-342-1394 ? RCATS: Transportation to medical appointments. 336-347-2287 ? Social Security Administration: May apply for disability if have a Stage IV cancer. 336-342-7796 1-800-772-1213 ? [place] Aging, Disability and Transit Services: Assists with nutrition, care and transit needs. 336-349-2343  Cancer Center Support Programs: @10RELATIVEDAYS@ > Cancer Support Group  2nd Tuesday of the month 1pm-2pm, Journey Room  > Creative Journey  3rd Tuesday of the month 1130am-1pm, Journey Room  > Look Good Feel Better  1st Wednesday of the month 10am-12 noon, Journey Room (Call American Cancer Society to register 1-800-395-5775)   

## 2016-04-08 ENCOUNTER — Encounter (HOSPITAL_COMMUNITY)
Admission: RE | Admit: 2016-04-08 | Discharge: 2016-04-08 | Disposition: A | Discharge status: Home / Self Care | Payer: Self-pay | Source: Ambulatory Visit | Attending: Hematology & Oncology | Admitting: Hematology & Oncology

## 2016-04-08 DIAGNOSIS — C787 Secondary malignant neoplasm of liver and intrahepatic bile duct: Secondary | ICD-10-CM | POA: Insufficient documentation

## 2016-04-08 DIAGNOSIS — C539 Malignant neoplasm of cervix uteri, unspecified: Secondary | ICD-10-CM | POA: Insufficient documentation

## 2016-04-08 LAB — GLUCOSE, CAPILLARY: GLUCOSE-CAPILLARY: 86 mg/dL (ref 65–99)

## 2016-04-08 MED ORDER — FLUDEOXYGLUCOSE F - 18 (FDG) INJECTION: 5.0000 | Freq: Once | INTRAVENOUS | Status: DC | PRN

## 2016-04-09 ENCOUNTER — Encounter (HOSPITAL_COMMUNITY): Payer: Medicaid - Out of State | Attending: Oncology

## 2016-04-09 ENCOUNTER — Encounter (HOSPITAL_COMMUNITY): Payer: Self-pay

## 2016-04-09 VITALS — BP 99/56 | HR 83 | Temp 97.6°F | Resp 18

## 2016-04-09 DIAGNOSIS — C787 Secondary malignant neoplasm of liver and intrahepatic bile duct: Secondary | ICD-10-CM | POA: Diagnosis not present

## 2016-04-09 DIAGNOSIS — C539 Malignant neoplasm of cervix uteri, unspecified: Secondary | ICD-10-CM | POA: Diagnosis not present

## 2016-04-09 MED ORDER — HEPARIN SOD (PORK) LOCK FLUSH 100 UNIT/ML IV SOLN
INTRAVENOUS | Status: AC
  Filled 2016-04-09: qty 5

## 2016-04-09 MED ORDER — HEPARIN SOD (PORK) LOCK FLUSH 100 UNIT/ML IV SOLN
500.0000 [IU] | Freq: Once | INTRAVENOUS | Status: AC
  Administered 2016-04-09: 500 [IU] via INTRAVENOUS
  Filled 2016-04-09: qty 5

## 2016-04-09 MED ORDER — SODIUM CHLORIDE 0.9 % IV SOLN
Freq: Once | INTRAVENOUS | Status: AC
  Administered 2016-04-09: 10:00:00 via INTRAVENOUS

## 2016-04-09 MED ORDER — SODIUM CHLORIDE 0.9% FLUSH
10.0000 mL | INTRAVENOUS | Status: DC | PRN
Start: 2016-04-09 — End: 2016-04-09
  Administered 2016-04-09: 10 mL via INTRAVENOUS
  Filled 2016-04-09: qty 10

## 2016-04-09 NOTE — Patient Instructions (Signed)
[place] at [facility] Discharge Instructions  RECOMMENDATIONS MADE BY THE CONSULTANT AND ANY TEST RESULTS WILL BE SENT TO YOUR REFERRING PHYSICIAN.  Received IV fluids today. Follow-up as scheduled. Call clinic for any questions or concerns  Thank you for choosing [place] at [facility] to provide your oncology and hematology care.  To afford each patient quality time with our provider, please arrive at least 15 minutes before your scheduled appointment time.    If you have a lab appointment with the [facility] please come in thru the  Main Entrance and check in at the main information desk  You need to re-schedule your appointment should you arrive 10 or more minutes late.  We strive to give you quality time with our providers, and arriving late affects you and other patients whose appointments are after yours.  Also, if you no show three or more times for appointments you may be dismissed from the clinic at the providers discretion.     Again, thank you for choosing [facility].  Our hope is that these requests will decrease the amount of time that you wait before being seen by our physicians.       _____________________________________________________________  Should you have questions after your visit to [facility], please contact our office at [phone] between the hours of 8:30 a.m. and 4:30 p.m.  Voicemails left after 4:30 p.m. will not be returned until the following business day.  For prescription refill requests, have your pharmacy contact our office.       Resources For Cancer Patients and their Caregivers ? American Cancer Society: Can assist with transportation, wigs, general needs, runs Look Good Feel Better.        215 174 2426 ? Cancer Care: Provides financial assistance, online support groups, medication/co-pay assistance.  1-800-813-HOPE (239)858-4142) ? [place] Assists [place] cancer patients and their families through emotional , educational and financial support.  312-662-3705 ? [place] DSS Where to apply for food stamps, Medicaid and utility assistance. 361-096-7536 ? RCATS: Transportation to medical appointments. 347-162-6666 ? Social Security Administration: May apply for disability if have a Stage IV cancer. 325-192-1707 260-735-1647 ? [employer], Disability and Transit Services: Assists with nutrition, care and transit needs. [place] Support Programs: @10RELATIVEDAYS @ > Cancer Support Group  2nd Tuesday of the month 1pm-2pm, Journey Room  > Creative Journey  3rd Tuesday of the month 1130am-1pm, Journey Room  > Look Good Feel Better  1st Wednesday of the month 10am-12 noon, Journey Room (Call [place] to register [phone])

## 2016-04-09 NOTE — Progress Notes (Signed)
Mason Jim tolerated IV fluids well without complaints or incident.VSS upon discharge. Pt discharged self ambulatory in satisfactory condition accompanied by family

## 2016-04-13 ENCOUNTER — Ambulatory Visit (HOSPITAL_COMMUNITY): Payer: Self-pay

## 2016-04-15 ENCOUNTER — Encounter (HOSPITAL_COMMUNITY): Payer: Self-pay

## 2016-04-15 ENCOUNTER — Encounter (HOSPITAL_BASED_OUTPATIENT_CLINIC_OR_DEPARTMENT_OTHER): Payer: Medicaid - Out of State

## 2016-04-15 VITALS — BP 113/74 | HR 81 | Temp 98.0°F | Resp 18 | Wt 78.4 lb

## 2016-04-15 DIAGNOSIS — C539 Malignant neoplasm of cervix uteri, unspecified: Secondary | ICD-10-CM

## 2016-04-15 DIAGNOSIS — E639 Nutritional deficiency, unspecified: Secondary | ICD-10-CM | POA: Diagnosis not present

## 2016-04-15 MED ORDER — HYDROCODONE-ACETAMINOPHEN 7.5-325 MG PO TABS: 1.0000 | ORAL_TABLET | ORAL | 0 refills | Status: DC | PRN

## 2016-04-15 MED ORDER — SODIUM CHLORIDE 0.9 % IV SOLN
Freq: Once | INTRAVENOUS | Status: AC
  Administered 2016-04-15: 11:00:00 via INTRAVENOUS

## 2016-04-15 MED ORDER — SODIUM CHLORIDE 0.45 % IV SOLN
Freq: Once | INTRAVENOUS | Status: DC
  Filled 2016-04-15: qty 1000

## 2016-04-15 MED ORDER — SODIUM CHLORIDE 0.9% FLUSH
10.0000 mL | INTRAVENOUS | Status: DC | PRN
  Administered 2016-04-15: 10 mL
  Filled 2016-04-15: qty 10

## 2016-04-15 MED ORDER — HEPARIN SOD (PORK) LOCK FLUSH 100 UNIT/ML IV SOLN
500.0000 [IU] | Freq: Once | INTRAVENOUS | Status: AC | PRN
  Administered 2016-04-15: 500 [IU]
  Filled 2016-04-15: qty 5

## 2016-04-15 NOTE — Patient Instructions (Signed)
[place] at [facility] Discharge Instructions  RECOMMENDATIONS MADE BY THE CONSULTANT AND ANY TEST RESULTS WILL BE SENT TO YOUR REFERRING PHYSICIAN.  1 liter of fluids given Follow up as scheduled  Thank you for choosing [place] at [facility] to provide your oncology and hematology care.  To afford each patient quality time with our provider, please arrive at least 15 minutes before your scheduled appointment time.    If you have a lab appointment with the [facility] please come in thru the  Main Entrance and check in at the main information desk  You need to re-schedule your appointment should you arrive 10 or more minutes late.  We strive to give you quality time with our providers, and arriving late affects you and other patients whose appointments are after yours.  Also, if you no show three or more times for appointments you may be dismissed from the clinic at the providers discretion.     Again, thank you for choosing [facility].  Our hope is that these requests will decrease the amount of time that you wait before being seen by our physicians.       _____________________________________________________________  Should you have questions after your visit to [facility], please contact our office at [phone] between the hours of 8:30 a.m. and 4:30 p.m.  Voicemails left after 4:30 p.m. will not be returned until the following business day.  For prescription refill requests, have your pharmacy contact our office.       Resources For Cancer Patients and their Caregivers ? American Cancer Society: Can assist with transportation, wigs, general needs, runs Look Good Feel Better.        434-438-7695 ? Cancer Care: Provides financial assistance, online support groups, medication/co-pay assistance.  1-800-813-HOPE (479)792-6664) ? [place] Assists [place] cancer patients and  their families through emotional , educational and financial support.  818-439-6720 ? [place] DSS Where to apply for food stamps, Medicaid and utility assistance. 618-477-6926 ? RCATS: Transportation to medical appointments. 725-852-3298 ? Social Security Administration: May apply for disability if have a Stage IV cancer. [phone] ? [employer], Disability and Transit Services: Assists with nutrition, care and transit needs. [place] Support Programs: @10RELATIVEDAYS @ > Cancer Support Group  2nd Tuesday of the month 1pm-2pm, Journey Room  > Creative Journey  3rd Tuesday of the month 1130am-1pm, Journey Room  > Look Good Feel Better  1st Wednesday of the month 10am-12 noon, Journey Room (Call [place] to register [phone])

## 2016-04-15 NOTE — Progress Notes (Signed)
Tolerated fluids well. Stable and ambulatory on discharge home with boyfriend.

## 2016-04-15 NOTE — Progress Notes (Signed)
Patient received one liter of fluids today per order. Vitals stable and discharged home from clinic ambulatory. Follow up as scheduled.

## 2016-04-16 ENCOUNTER — Telehealth (HOSPITAL_COMMUNITY): Payer: Self-pay | Admitting: Oncology

## 2016-04-16 NOTE — Telephone Encounter (Signed)
Pc to vision rx to get auth for [facility] 7.5-325. Per csr pts plan termed on 2/28 and is scheduled for eligibility again on 05/02/16. Contacted pt to let her know

## 2016-04-19 NOTE — Assessment & Plan Note (Addendum)
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  On systemic chemotherapy consisting of Carboplatin/Paclitaxel/Avastin on 01/21/2016.  PET on 04/08/2016 demonstrates positive response to therapy.   Oncology history updated.  Pre-treatment labs today: CBC diff, CMET, CA 125.  I personally reviewed and went over laboratory results with the patient.  The results are noted within this dictation.  Hypokalemia is noted today.  30 mEq IV K+ is ordered today in the clinic along with 60 mEq PO KDUR in the clinic.  She will restart her PO KDUR.  Magnesium level is added today.  Will give 1 gram of IV Magnesium today in the clinic as well, empirically.  I personally reviewed and went over radiographic studies with the patient.  The results are noted within this dictation.  PET scan demonstrates a positive response to therapy.  She reports paying for her Hydrocodone because it was > than a 15 day supply and her insurance would not cover.  As a result, she is given a 2 week supply of pain medication x 2 in an attempt to maintain insurance coverage.  I have refilled her Ativan as well.  Orders are placed for re-imaging following cycle #6 of therapy for restaging purposes.  Return in 3 weeks for follow-up and cycle #6.  Will refer patient to [place] for further recommendations following cycle #6.

## 2016-04-20 ENCOUNTER — Encounter (HOSPITAL_BASED_OUTPATIENT_CLINIC_OR_DEPARTMENT_OTHER): Payer: Medicaid - Out of State

## 2016-04-20 ENCOUNTER — Encounter: Payer: Self-pay | Admitting: *Deleted

## 2016-04-20 ENCOUNTER — Encounter (HOSPITAL_BASED_OUTPATIENT_CLINIC_OR_DEPARTMENT_OTHER): Payer: Medicaid - Out of State | Admitting: Oncology

## 2016-04-20 ENCOUNTER — Ambulatory Visit (HOSPITAL_COMMUNITY): Payer: Self-pay

## 2016-04-20 ENCOUNTER — Encounter (HOSPITAL_COMMUNITY): Payer: Self-pay

## 2016-04-20 VITALS — BP 105/73 | HR 72 | Temp 98.0°F | Resp 18 | Wt 76.8 lb

## 2016-04-20 DIAGNOSIS — C787 Secondary malignant neoplasm of liver and intrahepatic bile duct: Secondary | ICD-10-CM | POA: Diagnosis not present

## 2016-04-20 DIAGNOSIS — Z5111 Encounter for antineoplastic chemotherapy: Secondary | ICD-10-CM | POA: Diagnosis not present

## 2016-04-20 DIAGNOSIS — E876 Hypokalemia: Secondary | ICD-10-CM

## 2016-04-20 DIAGNOSIS — Z5112 Encounter for antineoplastic immunotherapy: Secondary | ICD-10-CM | POA: Diagnosis present

## 2016-04-20 DIAGNOSIS — C539 Malignant neoplasm of cervix uteri, unspecified: Secondary | ICD-10-CM | POA: Diagnosis present

## 2016-04-20 DIAGNOSIS — K769 Liver disease, unspecified: Secondary | ICD-10-CM | POA: Diagnosis not present

## 2016-04-20 DIAGNOSIS — Z87891 Personal history of nicotine dependence: Secondary | ICD-10-CM | POA: Diagnosis not present

## 2016-04-20 DIAGNOSIS — F419 Anxiety disorder, unspecified: Secondary | ICD-10-CM | POA: Diagnosis not present

## 2016-04-20 LAB — CBC WITH DIFFERENTIAL/PLATELET
BASOS PCT: 0 %
Basophils Absolute: 0 10*3/uL (ref 0.0–0.1)
EOS ABS: 0 10*3/uL (ref 0.0–0.7)
EOS PCT: 0 %
HCT: 30.9 % — ABNORMAL LOW (ref 36.0–46.0)
HEMOGLOBIN: 10 g/dL — AB (ref 12.0–15.0)
Lymphocytes Relative: 10 %
Lymphs Abs: 0.9 10*3/uL (ref 0.7–4.0)
MCH: 34 pg (ref 26.0–34.0)
MCHC: 32.4 g/dL (ref 30.0–36.0)
MCV: 105.1 fL — ABNORMAL HIGH (ref 78.0–100.0)
MONO ABS: 0.9 10*3/uL (ref 0.1–1.0)
MONOS PCT: 10 %
NEUTROS PCT: 80 %
Neutro Abs: 7.3 10*3/uL (ref 1.7–7.7)
PLATELETS: 183 10*3/uL (ref 150–400)
RBC: 2.94 MIL/uL — ABNORMAL LOW (ref 3.87–5.11)
RDW: 17 % — AB (ref 11.5–15.5)
WBC: 9.1 10*3/uL (ref 4.0–10.5)

## 2016-04-20 LAB — URINALYSIS, DIPSTICK ONLY
BILIRUBIN URINE: NEGATIVE
GLUCOSE, UA: NEGATIVE mg/dL
Ketones, ur: NEGATIVE mg/dL
Nitrite: NEGATIVE
PH: 6.5 (ref 5.0–8.0)
Specific Gravity, Urine: 1.015 (ref 1.005–1.030)

## 2016-04-20 LAB — COMPREHENSIVE METABOLIC PANEL
ALBUMIN: 3 g/dL — AB (ref 3.5–5.0)
ALK PHOS: 98 U/L (ref 38–126)
ALT: 7 U/L — ABNORMAL LOW (ref 14–54)
ANION GAP: 9 (ref 5–15)
AST: 12 U/L — ABNORMAL LOW (ref 15–41)
BUN: 13 mg/dL (ref 6–20)
CALCIUM: 9 mg/dL (ref 8.9–10.3)
CO2: 32 mmol/L (ref 22–32)
Chloride: 96 mmol/L — ABNORMAL LOW (ref 101–111)
Creatinine, Ser: 0.54 mg/dL (ref 0.44–1.00)
GFR calc Af Amer: >60 mL/min (ref >60)
GFR calc non Af Amer: >60 mL/min (ref >60)
GLUCOSE: 94 mg/dL (ref 65–99)
POTASSIUM: 2.9 mmol/L — AB (ref 3.5–5.1)
SODIUM: 137 mmol/L (ref 135–145)
Total Bilirubin: 0.4 mg/dL (ref 0.3–1.2)
Total Protein: 7.4 g/dL (ref 6.5–8.1)

## 2016-04-20 LAB — MAGNESIUM: Magnesium: 1.7 mg/dL (ref 1.7–2.4)

## 2016-04-20 MED ORDER — SODIUM CHLORIDE 0.9 % IV SOLN
20.0000 mg | Freq: Once | INTRAVENOUS | Status: AC
  Administered 2016-04-20: 20 mg via INTRAVENOUS
  Filled 2016-04-20: qty 2

## 2016-04-20 MED ORDER — MAGNESIUM SULFATE IN D5W 1-5 GM/100ML-% IV SOLN
1.0000 g | Freq: Once | INTRAVENOUS | Status: DC
  Filled 2016-04-20: qty 100

## 2016-04-20 MED ORDER — POTASSIUM CHLORIDE 10 MEQ/100ML IV SOLN: 10.0000 meq | INTRAVENOUS | Status: DC

## 2016-04-20 MED ORDER — PEGFILGRASTIM 6 MG/0.6ML ~~LOC~~ PSKT
6.0000 mg | PREFILLED_SYRINGE | Freq: Once | SUBCUTANEOUS | Status: AC
  Administered 2016-04-20: 6 mg via SUBCUTANEOUS
  Filled 2016-04-20: qty 0.6

## 2016-04-20 MED ORDER — SODIUM CHLORIDE 0.9 % IV SOLN
393.5000 mg | Freq: Once | INTRAVENOUS | Status: AC
  Administered 2016-04-20: 390 mg via INTRAVENOUS
  Filled 2016-04-20: qty 39

## 2016-04-20 MED ORDER — DIPHENHYDRAMINE HCL 50 MG/ML IJ SOLN
INTRAMUSCULAR | Status: AC
  Filled 2016-04-20: qty 1

## 2016-04-20 MED ORDER — DEXTROSE 5 % IV SOLN
175.0000 mg/m2 | Freq: Once | INTRAVENOUS | Status: AC
  Administered 2016-04-20: 216 mg via INTRAVENOUS
  Filled 2016-04-20: qty 36

## 2016-04-20 MED ORDER — DIPHENHYDRAMINE HCL 50 MG/ML IJ SOLN
50.0000 mg | Freq: Once | INTRAMUSCULAR | Status: AC
  Administered 2016-04-20: 50 mg via INTRAVENOUS

## 2016-04-20 MED ORDER — FAMOTIDINE IN NACL 20-0.9 MG/50ML-% IV SOLN
20.0000 mg | Freq: Once | INTRAVENOUS | Status: AC
  Administered 2016-04-20: 20 mg via INTRAVENOUS

## 2016-04-20 MED ORDER — DEXTROSE 5 % IV SOLN
Freq: Once | INTRAVENOUS | Status: AC
  Administered 2016-04-20: 13:00:00 via INTRAVENOUS
  Filled 2016-04-20: qty 250

## 2016-04-20 MED ORDER — POTASSIUM CHLORIDE CRYS ER 20 MEQ PO TBCR
60.0000 meq | EXTENDED_RELEASE_TABLET | Freq: Once | ORAL | Status: AC
  Administered 2016-04-20: 60 meq via ORAL
  Filled 2016-04-20: qty 3

## 2016-04-20 MED ORDER — HYDROCODONE-ACETAMINOPHEN 7.5-325 MG PO TABS: 1.0000 | ORAL_TABLET | ORAL | 0 refills | Status: DC | PRN

## 2016-04-20 MED ORDER — MAGNESIUM SULFATE IN D5W 1-5 GM/100ML-% IV SOLN
1.0000 g | Freq: Once | INTRAVENOUS | Status: AC
  Administered 2016-04-20: 1 g via INTRAVENOUS

## 2016-04-20 MED ORDER — SODIUM CHLORIDE 0.9 % IV SOLN
15.0000 mg/kg | Freq: Once | INTRAVENOUS | Status: AC
  Administered 2016-04-20: 525 mg via INTRAVENOUS
  Filled 2016-04-20: qty 16

## 2016-04-20 MED ORDER — SODIUM CHLORIDE 0.9 % IV SOLN
Freq: Once | INTRAVENOUS | Status: AC
  Administered 2016-04-20: 11:00:00 via INTRAVENOUS

## 2016-04-20 MED ORDER — HEPARIN SOD (PORK) LOCK FLUSH 100 UNIT/ML IV SOLN
500.0000 [IU] | Freq: Once | INTRAVENOUS | Status: AC | PRN
  Administered 2016-04-20: 500 [IU]
  Filled 2016-04-20 (×2): qty 5

## 2016-04-20 MED ORDER — FAMOTIDINE IN NACL 20-0.9 MG/50ML-% IV SOLN
INTRAVENOUS | Status: AC
  Filled 2016-04-20: qty 50

## 2016-04-20 MED ORDER — PALONOSETRON HCL INJECTION 0.25 MG/5ML
0.2500 mg | Freq: Once | INTRAVENOUS | Status: AC
  Administered 2016-04-20: 0.25 mg via INTRAVENOUS

## 2016-04-20 MED ORDER — SODIUM CHLORIDE 0.9% FLUSH: 10.0000 mL | INTRAVENOUS | Status: DC | PRN

## 2016-04-20 MED ORDER — PALONOSETRON HCL INJECTION 0.25 MG/5ML
INTRAVENOUS | Status: AC
  Filled 2016-04-20: qty 5

## 2016-04-20 MED ORDER — LORAZEPAM 1 MG PO TABS: ORAL_TABLET | ORAL | 1 refills | Status: AC

## 2016-04-20 NOTE — Progress Notes (Signed)
Patient tolerated infusion well.  VSS.  Patient ambulatory and stable upon discharge from clinic.   

## 2016-04-20 NOTE — Patient Instructions (Signed)
[place] at [facility] Discharge Instructions  RECOMMENDATIONS MADE BY THE CONSULTANT AND ANY TEST RESULTS WILL BE SENT TO YOUR REFERRING PHYSICIAN.  You were seen today by Kirby Crigler PA-C. Return on Friday for IV fluids and every Monday and Friday for fluids. Nutrition appointment on Friday as scheduled. Refills given today. Follow up in 3 weeks.   Thank you for choosing [place] at [facility] to provide your oncology and hematology care.  To afford each patient quality time with our provider, please arrive at least 15 minutes before your scheduled appointment time.    If you have a lab appointment with the [facility] please come in thru the  Main Entrance and check in at the main information desk  You need to re-schedule your appointment should you arrive 10 or more minutes late.  We strive to give you quality time with our providers, and arriving late affects you and other patients whose appointments are after yours.  Also, if you no show three or more times for appointments you may be dismissed from the clinic at the providers discretion.     Again, thank you for choosing [facility].  Our hope is that these requests will decrease the amount of time that you wait before being seen by our physicians.       _____________________________________________________________  Should you have questions after your visit to [facility], please contact our office at [phone] between the hours of 8:30 a.m. and 4:30 p.m.  Voicemails left after 4:30 p.m. will not be returned until the following business day.  For prescription refill requests, have your pharmacy contact our office.       Resources For Cancer Patients and their Caregivers ? American Cancer Society: Can assist with transportation, wigs, general needs, runs Look Good Feel Better.        947-440-1662 ? Cancer Care: Provides financial assistance,  online support groups, medication/co-pay assistance.  1-800-813-HOPE 818-614-7823) ? [place] Assists [place] cancer patients and their families through emotional , educational and financial support.  (610) [phone] ? [place] DSS Where to apply for food stamps, Medicaid and utility assistance. ([phone] ? RCATS: Transportation to medical appointments. 763-404-7908 ? Social Security Administration: May apply for disability if have a Stage IV cancer. 505-076-5385 [phone] ? [employer], Disability and Transit Services: Assists with nutrition, care and transit needs. [place] Support Programs: @10RELATIVEDAYS @ > Cancer Support Group  2nd Tuesday of the month 1pm-2pm, Journey Room  > Creative Journey  3rd Tuesday of the month 1130am-1pm, Journey Room  > Look Good Feel Better  1st Wednesday of the month 10am-12 noon, Journey Room (Call [place] to register [phone])

## 2016-04-20 NOTE — Addendum Note (Signed)
Addended by: [facility] on: 04/20/2016 04:59 PM   Modules accepted: Orders

## 2016-04-20 NOTE — Progress Notes (Signed)
No PCP Per Patient No address on file  Recurrent cervical cancer ([place]) - Plan: NM PET Image Restag (PS) Skull Base To Thigh, LORazepam (ATIVAN) 1 MG tablet, HYDROcodone-acetaminophen (NORCO) 7.5-325 MG tablet, DISCONTINUED: HYDROcodone-acetaminophen (NORCO) 7.5-325 MG tablet  Hypokalemia - Plan: Magnesium, magnesium sulfate IVPB 1 g 100 mL  CURRENT THERAPY: Carboplatin/Paclitaxel/Avastin beginning on 01/21/2016.  INTERVAL HISTORY: Erika [name] 41 y.o. female returns for followup of metastatic squamous cell carcinoma of cervix.    Recurrent cervical cancer ([place])   02/16/2014 Procedure    Cervical biopsy by Dr. Denman George      02/18/2014 Pathology Results    Cervix, biopsy - SQUAMOUS CELL CARCINOMA.      03/07/2014 - 04/24/2014 Radiation Therapy    Dr. Sondra Come- vaginal brachytherapy, 45 Gy to pelvis with sidewall boost 9 Gy and HDR 28.5 Gy in 5 fractions      03/08/2014 - 04/03/2014 Chemotherapy    Weekly CDDP 40 mg/m2 by Dr. 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      07/12/2014 PET scan    Marked interval decrease in size and hypermetabolism of the cervical mass.  The bilateral hypermetabolic pelvic sidewall lymphadenopathy has resolved in the interval.  Areas of hypermetabolic brown fat bilaterally in the chest and abdomen.      11/05/2015 Imaging    MRI abdomen at [facility]- 7.0 x 5.3 cm are in left hepatic lobe with central 3.1 x 2.7 x 2.3 cm complex cystic area, as well as an apparent subcapsular foci      11/26/2015 Procedure    US aspiration of left hepatic lobe- yielding 13 cc of yellow purulent fluid (pain resolved and PO intake increased)      11/26/2015 Pathology Results    [id number]- highly suspicious for squamous cell carcinoma      12/09/2015 PET scan    New small hypermetabolic right internal mammary soft tissue density, suspicious for metastatic  lymphadenopathy.  Increased size and number of hypermetabolic liver metastases. New mild hypermetabolic activity in porta hepatis is suspicious for lymph node metastases.  No residual hypermetabolic cervical mass identified. Multiple uterine fibroids again seen without FDG uptake.  New diffuse rectal wall thickening with hypermetabolic activity, suspicious for proctitis which could be infectious in etiology or secondary to radiation changes ; recommend clinical correlation. Small pelvic fluid collection between the anterior rectal wall and posterior uterine wall, possibly due to abscess.      12/12/2015 Initial Diagnosis    Recurrent cervical cancer ([place])     01/02/2016 Procedure    Port placed by Dr. Ladona Horns      01/09/2016 Procedure    US biopsy      01/12/2016 Pathology Results    Diagnosis Liver, needle/core biopsy - METASTATIC SQUAMOUS CELL CARCINOMA.      01/21/2016 -  Chemotherapy    The patient had palonosetron (ALOXI) injection 0.25 mg, 0.25 mg, Intravenous,  Once, 0 of 4 cycles  bevacizumab (AVASTIN) 625 mg in sodium chloride 0.9 % 100 mL chemo infusion, 15 mg/kg = 625 mg (100 % of original dose 15 mg/kg), Intravenous,  Once, 0 of 4 cycles Dose modification: 15 mg/kg (original dose 15 mg/kg, Cycle 1, Reason: Provider Judgment)  CARBOplatin (PARAPLATIN) in sodium chloride 0.9 % 100 mL chemo infusion,  (original dose ), Intravenous,  Once, 0 of 4 cycles Dose modification:   (Cycle 1)  PACLitaxel (TAXOL)  234 mg in dextrose 5 % 250 mL chemo infusion (> 80mg /m2), 175 mg/m2 = 234 mg, Intravenous,  Once, 0 of 4 cycles  for chemotherapy treatment.        04/08/2016 PET scan    Much improved PET-CT when compared to prior study from 2017. There are 2 slightly hypermetabolic residual hepatic lesions.  Diffuse marrow activity likely due to rebound or marrow stimulating drugs.       She is tolerating therapy well with the addition of IV fluids twice weekly.  She  denies any nausea or vomiting.  She notes a good appetite.  Weight is stable over the past 8 weeks.  Weight overall is down.  She is followed by nutrition and has an appointment on Friday for this.  She reports improvement in tolerability of treatment with twice weekly IVF.  We will continue with this practice as a result.  Review of Systems  Constitutional: Positive for weight loss. Negative for chills and fever.  HENT: Negative.   Eyes: Negative.   Respiratory: Negative.  Negative for cough.   Cardiovascular: Negative.  Negative for chest pain.  Gastrointestinal: Negative.  Negative for blood in stool, constipation, diarrhea, melena, nausea and vomiting.  Genitourinary: Negative.   Musculoskeletal: Negative.   Skin: Negative.   Neurological: Negative.  Negative for weakness.  Endo/Heme/Allergies: Negative.   Psychiatric/Behavioral: Negative.     Past Medical History:  Diagnosis Date  . Anxiety   . Cervical cancer ([place])   . Cervical cancer, FIGO stage IIB ([place]) 04/09/2014  . Family history of adverse reaction to anesthesia    pt states her sister has to have the "reversal" after surgery   . Hypokalemia   . Microcytic hypochromic anemia   . Recurrent cervical cancer ([place]) 12/12/2015    Past Surgical History:  Procedure Laterality Date  . CESAREAN SECTION    . port a cath placement      January 2016 / right   . TANDEM RING INSERTION N/A 04/09/2014   Procedure: TANDEM RING PLACEMENT FOR HIGH DOSE RATE RADIATION THERAPY EXAM UNDER ANETHESIA PLACEMENT OF CERVICAL SLEEVE;  Surgeon: Gery Pray, MD;  Location: [facility];  Service: Urology;  Laterality: N/A;  . TANDEM RING INSERTION N/A 04/23/2014   Procedure: TANDEM RING PLACEMENT ;  Surgeon: Gery Pray, MD;  Location: [facility];  Service: Urology;  Laterality: N/A;  . TANDEM RING INSERTION N/A 04/30/2014   Procedure: TANDEM RING PLACEMENT,EXAM UNDER ANESTHESIA ;  Surgeon: Gery Pray, MD;  Location: [facility];  Service: Urology;  Laterality:  N/A;  . TANDEM RING INSERTION N/A 05/07/2014   Procedure: TANDEM RING PLACEMENT;  Surgeon: Gery Pray, MD;  Location: [facility];  Service: Urology;  Laterality: N/A;  . TANDEM RING INSERTION N/A 05/17/2014   Procedure: TANDEM RING INSERTION;  Surgeon: Gery Pray, MD;  Location: [facility];  Service: Urology;  Laterality: N/A;  . TUBAL LIGATION      Family History  Problem Relation Age of Onset  . Asthma Mother   . Heart attack Father     Social History   Social History  . Marital status: Single    Spouse name: N/A  . Number of children: 2  . Years of education: N/A   Occupational History  . cook at [employer] History Main Topics  . Smoking status: Former Smoker    Packs/day: 0.50    Years: 6.00    Types: Cigarettes    Quit date: 08/02/2011  .  Smokeless tobacco: Never Used  . Alcohol use No  . Drug use: Yes    Types: Marijuana  . Sexual activity: Yes   Other Topics Concern  . Not on file   Social History Narrative  . No narrative on file     PHYSICAL EXAMINATION  ECOG PERFORMANCE STATUS: 1 - Symptomatic but completely ambulatory  There were no vitals filed for this visit.  Vitals - 1 value per visit 1/95/0932  SYSTOLIC 671  DIASTOLIC 63  Pulse 245  Temperature 98.1  Respirations 18  Weight (lb) 76.8    GENERAL:alert, no distress, cachectic, smiling and in chemo-bed SKIN: skin color, texture, turgor are normal, no rashes or significant lesions HEAD: Normocephalic, No masses, lesions, tenderness or abnormalities EYES: normal, EOMI, Conjunctiva are pink and non-injected EARS: External ears normal OROPHARYNX:lips, buccal mucosa, and tongue normal and mucous membranes are moist  NECK: supple, no adenopathy, trachea midline LYMPH:  no palpable lymphadenopathy BREAST:not examined LUNGS: clear to auscultation  HEART: regular rate & rhythm ABDOMEN:abdomen soft and normal bowel sounds BACK: Back symmetric, no curvature. EXTREMITIES:less  then 2 second capillary refill, no joint deformities, effusion, or inflammation, no skin discoloration, no cyanosis  NEURO: alert & oriented x 3 with fluent speech, no focal motor/sensory deficits, gait normal   LABORATORY DATA: CBC    Component Value Date/Time   WBC 9.1 04/20/2016 0929   RBC 2.94 (L) 04/20/2016 0929   HGB 10.0 (L) 04/20/2016 0929   HGB 12.0 12/12/2015 0920   HCT 30.9 (L) 04/20/2016 0929   HCT 37.6 12/12/2015 0920   PLT 183 04/20/2016 0929   PLT 289 12/12/2015 0920   MCV 105.1 (H) 04/20/2016 0929   MCV 89.1 12/12/2015 0920   MCH 34.0 04/20/2016 0929   MCHC 32.4 04/20/2016 0929   RDW 17.0 (H) 04/20/2016 0929   RDW 15.5 (H) 12/12/2015 0920   LYMPHSABS 0.9 04/20/2016 0929   LYMPHSABS 1.0 12/12/2015 0920   MONOABS 0.9 04/20/2016 0929   MONOABS 0.3 12/12/2015 0920   EOSABS 0.0 04/20/2016 0929   EOSABS 0.0 12/12/2015 0920   BASOSABS 0.0 04/20/2016 0929   BASOSABS 0.0 12/12/2015 0920      Chemistry      Component Value Date/Time   NA 137 04/20/2016 0929   NA 140 12/12/2015 0920   K 2.9 (L) 04/20/2016 0929   K 4.0 12/12/2015 0920   CL 96 (L) 04/20/2016 0929   CO2 32 04/20/2016 0929   CO2 26 12/12/2015 0920   BUN 13 04/20/2016 0929   BUN 8.4 12/12/2015 0920   CREATININE 0.54 04/20/2016 0929   CREATININE 0.7 12/12/2015 0920      Component Value Date/Time   CALCIUM 9.0 04/20/2016 0929   CALCIUM 10.5 (H) 12/12/2015 0920   ALKPHOS 98 04/20/2016 0929   ALKPHOS 148 12/12/2015 0920   AST 12 (L) 04/20/2016 0929   AST 15 12/12/2015 0920   ALT 7 (L) 04/20/2016 0929   ALT 8 12/12/2015 0920   BILITOT 0.4 04/20/2016 0929   BILITOT 0.35 12/12/2015 0920        PENDING LABS:   RADIOGRAPHIC STUDIES:  Nm Pet Image Restag (ps) Skull Base To Thigh  Result Date: 04/08/2016 CLINICAL DATA:  Subsequent treatment strategy for cervical cancer. EXAM: NUCLEAR MEDICINE PET SKULL BASE TO THIGH TECHNIQUE: 5.0 mCi F-18 FDG was injected intravenously. Full-ring PET imaging  was performed from the skull base to thigh after the radiotracer. CT data was obtained and used for attenuation correction and anatomic  localization. FASTING BLOOD GLUCOSE:  Value: 86 mg/dl COMPARISON:  12/09/2015 FINDINGS: NECK No hypermetabolic lymph nodes in the neck. CHEST No hypermetabolic mediastinal or hilar nodes. No suspicious pulmonary nodules on the CT scan. The internal mammary disease has resolved. ABDOMEN/PELVIS Small area of hypermetabolism and segment 8 of the liver with SUV max 3.28. This was a large area and SUV max was 20.7 on the prior examination. Very subtle area of uptake noted knee and segment 3 with SUV max of 2.79. This was previously 5.0. No other hepatic lesions are identified. In the pelvis there is some persistent hypermetabolism noted in small uterine fibroids. No pelvic mass or adenopathy. SKELETON No focal hypermetabolic activity to suggest skeletal metastasis. Diffuse hypermetabolism likely due to marrow rebound from chemotherapy or marrow stimulating drugs. Other: Areas of hypermetabolism noted on the patient's scan posteriorly in the low back area but no CT correlate. These are likely areas contamination. IMPRESSION: Much improved PET-CT when compared to prior study from 2017. There are 2 slightly hypermetabolic residual hepatic lesions. Diffuse marrow activity likely due to rebound or marrow stimulating drugs. Electronically Signed   By: Marijo Sanes M.D.   On: 04/08/2016 14:48     PATHOLOGY:    ASSESSMENT AND PLAN:  Recurrent cervical cancer ([place]) Metastatic squamous cell carcinoma of cervix, S/P US guided biopsy of liver lesion confirming Stage IV disease.  On systemic chemotherapy consisting of Carboplatin/Paclitaxel/Avastin on 01/21/2016.  PET on 04/08/2016 demonstrates positive response to therapy.   Oncology history updated.  Pre-treatment labs today: CBC diff, CMET, CA 125.  I personally reviewed and went over laboratory results with the patient.  The results  are noted within this dictation.  Hypokalemia is noted today.  30 mEq IV K+ is ordered today in the clinic along with 60 mEq PO KDUR in the clinic.  She will restart her PO KDUR.  Magnesium level is added today.  Will give 1 gram of IV Magnesium today in the clinic as well, empirically.  I personally reviewed and went over radiographic studies with the patient.  The results are noted within this dictation.  PET scan demonstrates a positive response to therapy.  She reports paying for her Hydrocodone because it was > than a 15 day supply and her insurance would not cover.  As a result, she is given a 2 week supply of pain medication x 2 in an attempt to maintain insurance coverage.  I have refilled her Ativan as well.  Orders are placed for re-imaging following cycle #6 of therapy for restaging purposes.  Return in 3 weeks for follow-up and cycle #6.  Will refer patient to [place] for further recommendations following cycle #6.   ORDERS PLACED FOR THIS ENCOUNTER: Orders Placed This Encounter  Procedures  . NM PET Image Restag (PS) Skull Base To Thigh  . Magnesium    MEDICATIONS PRESCRIBED THIS ENCOUNTER: Meds ordered this encounter  Medications  . LORazepam (ATIVAN) 1 MG tablet    Sig: TAKE 1 TABLET BY MOUTH AT BEDTIME AS NEEDED FOR ANXIETY /INSOMNIA    Dispense:  30 tablet    Refill:  1    Order Specific Question:   Supervising Provider    Answer:   Brunetta Genera [[id number]]  . DISCONTD: HYDROcodone-acetaminophen (NORCO) 7.5-325 MG tablet    Sig: Take 1 tablet by mouth every 4 (four) hours as needed for moderate pain.    Dispense:  60 tablet    Refill:  0    Order  Specific Question:   Supervising Provider    Answer:   Brunetta Genera [[id number]]  . HYDROcodone-acetaminophen (NORCO) 7.5-325 MG tablet    Sig: Take 1 tablet by mouth every 4 (four) hours as needed for moderate pain.    Dispense:  60 tablet    Refill:  0    Order Specific Question:   Supervising Provider     Answer:   Brunetta Genera [[id number]]  . magnesium sulfate IVPB 1 g 100 mL    THERAPY PLAN:  6 cycles of chemotherapy followed by restaging scans.  She will be referred to gynecologic following cycle #6 for further treatment recommendations moving forward.  All questions were answered. The patient knows to call the clinic with any problems, questions or concerns. We can certainly see the patient much sooner if necessary.  Patient and plan discussed with Dr. Twana First and she is in agreement with the aforementioned.   This note is electronically signed by: Doy Mince 04/20/2016 12:18 PM

## 2016-04-20 NOTE — Patient Instructions (Signed)
[place] Clinical Social Work  Clinical Social Work was referred by nurse for assessment of psychosocial needs due to possible pt needs. Clinical Social Worker met with patient at [facility] during treatment to offer support and assess for needs.  CSW made aware that pt may be interested in [place] for her daughter. This camp is for kids who have a parent with cancer. CSW reviewed this resource with pt and provided handout. Pt reports her daughter is 39yo and doing well currently, no issues in school and helps her as needed. CSW inquired if there were any needs/concerns for pt/family currently. Pt reports she has had some financial hardship and CSW reviewed possible resources to assist. Pt plans to reach out to [place] to request some assistance. Pt also was educated to reach out to DSS in her home county as they could assist with gas money to get to treatment as she has medicaid. Pt will pursue and reach out for help with application once pt receives. CSW to follow and assist as needed.     Clinical Social Work interventions: Check in Resource education and refrral  Loren Racer, LCSW, OSW-C [place] Tuesdays   [phone]

## 2016-04-20 NOTE — Patient Instructions (Signed)
[facility] Discharge Instructions for Patients Receiving Chemotherapy   Beginning January 23rd 2017 lab work for the [facility] will be done in the  Main lab at [facility] on 1st floor. If you have a lab appointment with the [facility] please come in thru the  Main Entrance and check in at the main information desk   Today you received the following chemotherapy agents: Taxol, Carboplatin, and Avastin. We also gave you potassium in pill for and in your IV.   You also got magnesium IV today.     If you develop nausea and vomiting, or diarrhea that is not controlled by your medication, call the clinic.  The clinic phone number is [phone]. Office hours are Monday-Friday 8:30am-5:00pm.  BELOW ARE SYMPTOMS THAT SHOULD BE REPORTED IMMEDIATELY:  *FEVER GREATER THAN 101.0 F  *CHILLS WITH OR WITHOUT FEVER  NAUSEA AND VOMITING THAT IS NOT CONTROLLED WITH YOUR NAUSEA MEDICATION  *UNUSUAL SHORTNESS OF BREATH  *UNUSUAL BRUISING OR BLEEDING  TENDERNESS IN MOUTH AND THROAT WITH OR WITHOUT PRESENCE OF ULCERS  *URINARY PROBLEMS  *BOWEL PROBLEMS  UNUSUAL RASH Items with * indicate a potential emergency and should be followed up as soon as possible. If you have an emergency after office hours please contact your primary care physician or go to the nearest emergency department.  Please call the clinic during office hours if you have any questions or concerns.   You may also contact the Patient Navigator at [phone] should you have any questions or need assistance in obtaining follow up care.      Resources For Cancer Patients and their Caregivers ? American Cancer Society: Can assist with transportation, wigs, general needs, runs Look Good Feel Better.        585-227-1534 ? Cancer Care: Provides financial assistance, online support groups, medication/co-pay assistance.  1-800-813-HOPE 256-399-0142) ? [place] Assists [place] cancer patients and their families through emotional , educational and financial support.  515-349-6565 ? [place] DSS Where to apply for food stamps, Medicaid and utility assistance. 8181610783 ? [employer]: Transportation to medical appointments. 816 436 4313 ? Social Security Administration: May apply for disability if have a Stage IV cancer. 647-245-6082 786 191 5860 ? [employer], Disability and Transit Services: Assists with nutrition, care and transit needs. 226-399-8623

## 2016-04-21 ENCOUNTER — Telehealth: Payer: Self-pay | Admitting: *Deleted

## 2016-04-21 NOTE — Telephone Encounter (Signed)
I have tried to call the patient regarding her appt on 4/23 at 12pm. There was no answer and unable to message, phone cuts off.

## 2016-04-22 ENCOUNTER — Telehealth: Payer: Self-pay | Admitting: *Deleted

## 2016-04-22 NOTE — Telephone Encounter (Signed)
I have spoke with the patient and gaveher thye appt for April 23rd.

## 2016-04-23 ENCOUNTER — Encounter (HOSPITAL_BASED_OUTPATIENT_CLINIC_OR_DEPARTMENT_OTHER): Payer: Medicaid - Out of State

## 2016-04-23 ENCOUNTER — Encounter (HOSPITAL_COMMUNITY): Payer: Medicaid - Out of State

## 2016-04-23 VITALS — BP 117/75 | HR 108 | Temp 98.1°F | Resp 16

## 2016-04-23 DIAGNOSIS — E639 Nutritional deficiency, unspecified: Secondary | ICD-10-CM | POA: Diagnosis not present

## 2016-04-23 DIAGNOSIS — R634 Abnormal weight loss: Secondary | ICD-10-CM | POA: Diagnosis not present

## 2016-04-23 DIAGNOSIS — C787 Secondary malignant neoplasm of liver and intrahepatic bile duct: Secondary | ICD-10-CM

## 2016-04-23 DIAGNOSIS — C539 Malignant neoplasm of cervix uteri, unspecified: Secondary | ICD-10-CM

## 2016-04-23 MED ORDER — HEPARIN SOD (PORK) LOCK FLUSH 100 UNIT/ML IV SOLN
500.0000 [IU] | Freq: Once | INTRAVENOUS | Status: AC
  Administered 2016-04-23: 500 [IU] via INTRAVENOUS
  Filled 2016-04-23: qty 5

## 2016-04-23 MED ORDER — SODIUM CHLORIDE 0.9 % IV SOLN
INTRAVENOUS | Status: AC
  Administered 2016-04-23: 10:00:00 via INTRAVENOUS

## 2016-04-23 NOTE — Patient Instructions (Signed)
[place] at [facility] Discharge Instructions  RECOMMENDATIONS MADE BY THE CONSULTANT AND ANY TEST RESULTS WILL BE SENT TO YOUR REFERRING PHYSICIAN.  IV hydration fluids given as ordered. Return as scheduled.  Thank you for choosing [place] at [facility] to provide your oncology and hematology care.  To afford each patient quality time with our provider, please arrive at least 15 minutes before your scheduled appointment time.    If you have a lab appointment with the [facility] please come in thru the  Main Entrance and check in at the main information desk  You need to re-schedule your appointment should you arrive 10 or more minutes late.  We strive to give you quality time with our providers, and arriving late affects you and other patients whose appointments are after yours.  Also, if you no show three or more times for appointments you may be dismissed from the clinic at the providers discretion.     Again, thank you for choosing [facility].  Our hope is that these requests will decrease the amount of time that you wait before being seen by our physicians.       _____________________________________________________________  Should you have questions after your visit to [facility], please contact our office at [phone] between the hours of 8:30 a.m. and 4:30 p.m.  Voicemails left after 4:30 p.m. will not be returned until the following business day.  For prescription refill requests, have your pharmacy contact our office.       Resources For Cancer Patients and their Caregivers ? American Cancer Society: Can assist with transportation, wigs, general needs, runs Look Good Feel Better.        269-002-7681 ? Cancer Care: Provides financial assistance, online support groups, medication/co-pay assistance.  1-800-813-HOPE 219 586 6175) ? [place] Assists [place] cancer  patients and their families through emotional , educational and financial support.  (854) 283-6142 ? [place] DSS Where to apply for food stamps, Medicaid and utility assistance. 763-734-6512 ? [employer]: Transportation to medical appointments. 905 215 3256 ? Social Security Administration: May apply for disability if have a Stage IV cancer. 954-834-3019 (718)269-4055 ? [employer], Disability and Transit Services: Assists with nutrition, care and transit needs. [place] Support Programs: @10RELATIVEDAYS @ > Cancer Support Group  2nd Tuesday of the month 1pm-2pm, Journey Room  > Creative Journey  3rd Tuesday of the month 1130am-1pm, Journey Room  > Look Good Feel Better  1st Wednesday of the month 10am-12 noon, Journey Room (Call [facility] to register [phone])

## 2016-04-23 NOTE — Progress Notes (Signed)
Nutrition Follow-up:  Patient seen in clinic this am during IV fluid administration.    Patient reports that some days I can eat and other days I can't.  Biggest compliant is abdominal pain, cramping that keeps her from eating. Reports abdominal pain everyday but some days more severe than others. Also reports has pain medication but that tends to constipate her.    Reports that she is drinking at least 1 ensure plus per day sometimes 2.  Able to mention foods that are high in calories and protein.  Reports that she is adding a protein powder to milk as well for additional calories and protein (can't remember the name of it)  Patient reports she has one more chemo treatment to go then PET scan on 4/23.   Medications: reviewed   Labs: reviewed  Anthropometrics:   Patient weight was 76 pounds 12.8 oz on 3/20 decreased from 87 pound in January 2018  13% weight loss in the last 2 months, significant    NUTRITION DIAGNOSIS: Malnutrition continues   MALNUTRITION DIAGNOSIS: Patient now meets criteria for severe malnutrition in context of acute illness as evidenced by 13% weight loss in 2 months and moderate muscle mass loss and moderate fat depletion.   INTERVENTION:   Encouraged patient to continue to with high calorie, high protein foods Encouraged intake of ensure plus 2-3 times per day.  Patient given 2nd complimentary case of ensure plus today. Patient may need bowel regimen to help with constipation while taking pain medications.   Patient may benefit from appetite stimulant. If aggressive nutrition therapy is wanted and patient continues to lose weight may be candidate for feeding tube.      MONITORING, EVALUATION, GOAL: Patient will consume adequate calories and protein to maintain/gain weight   NEXT VISIT: Phone call on 4/27th  Laurrie Toppin B. Zenia Resides, [place], [place] Registered Dietitian [phone] (pager)

## 2016-04-23 NOTE — Progress Notes (Signed)
No complaints voiced today. Tolerated IV hydration fluids well. Stable and ambulatory on discharge home with family.

## 2016-04-26 ENCOUNTER — Encounter (HOSPITAL_COMMUNITY): Payer: Medicaid - Out of State

## 2016-04-28 ENCOUNTER — Encounter (HOSPITAL_BASED_OUTPATIENT_CLINIC_OR_DEPARTMENT_OTHER): Payer: Medicaid - Out of State

## 2016-04-28 VITALS — BP 123/86 | HR 99 | Temp 98.7°F | Resp 16

## 2016-04-28 DIAGNOSIS — C787 Secondary malignant neoplasm of liver and intrahepatic bile duct: Secondary | ICD-10-CM

## 2016-04-28 DIAGNOSIS — R634 Abnormal weight loss: Secondary | ICD-10-CM

## 2016-04-28 DIAGNOSIS — C539 Malignant neoplasm of cervix uteri, unspecified: Secondary | ICD-10-CM

## 2016-04-28 DIAGNOSIS — E639 Nutritional deficiency, unspecified: Secondary | ICD-10-CM

## 2016-04-28 MED ORDER — SODIUM CHLORIDE 0.9 % IV SOLN
INTRAVENOUS | Status: AC
  Administered 2016-04-28: 09:00:00 via INTRAVENOUS

## 2016-04-28 MED ORDER — HEPARIN SOD (PORK) LOCK FLUSH 100 UNIT/ML IV SOLN
500.0000 [IU] | Freq: Once | INTRAVENOUS | Status: AC
  Administered 2016-04-28: 500 [IU] via INTRAVENOUS
  Filled 2016-04-28: qty 5

## 2016-04-28 NOTE — Progress Notes (Signed)
Tolerated IV fluids well. Stable and ambulatory on discharge home with family.

## 2016-04-28 NOTE — Patient Instructions (Signed)
[place] at [facility] Discharge Instructions  RECOMMENDATIONS MADE BY THE CONSULTANT AND ANY TEST RESULTS WILL BE SENT TO YOUR REFERRING PHYSICIAN.  IV fluids given today as ordered. Return as scheduled.  Thank you for choosing [place] at [facility] to provide your oncology and hematology care.  To afford each patient quality time with our provider, please arrive at least 15 minutes before your scheduled appointment time.    If you have a lab appointment with the [facility] please come in thru the  Main Entrance and check in at the main information desk  You need to re-schedule your appointment should you arrive 10 or more minutes late.  We strive to give you quality time with our providers, and arriving late affects you and other patients whose appointments are after yours.  Also, if you no show three or more times for appointments you may be dismissed from the clinic at the providers discretion.     Again, thank you for choosing [facility].  Our hope is that these requests will decrease the amount of time that you wait before being seen by our physicians.       _____________________________________________________________  Should you have questions after your visit to [facility], please contact our office at [phone] between the hours of 8:30 a.m. and 4:30 p.m.  Voicemails left after 4:30 p.m. will not be returned until the following business day.  For prescription refill requests, have your pharmacy contact our office.       Resources For Cancer Patients and their Caregivers ? American Cancer Society: Can assist with transportation, wigs, general needs, runs Look Good Feel Better.        (615)751-9003 ? Cancer Care: Provides financial assistance, online support groups, medication/co-pay assistance.  1-800-813-HOPE 740-783-0797) ? [place] Assists [place] cancer  patients and their families through emotional , educational and financial support.  304-591-4631 ? [place] DSS Where to apply for food stamps, Medicaid and utility assistance. 4096031387 ? RCATS: Transportation to medical appointments. (978) 303-8455 ? Social Security Administration: May apply for disability if have a Stage IV cancer. [phone] [phone] ? [employer], Disability and Transit Services: Assists with nutrition, care and transit needs. [place] Support Programs: @10RELATIVEDAYS @ > Cancer Support Group  2nd Tuesday of the month 1pm-2pm, Journey Room  > Creative Journey  3rd Tuesday of the month 1130am-1pm, Journey Room  > Look Good Feel Better  1st Wednesday of the month 10am-12 noon, Journey Room (Call Elbert to register [phone])

## 2016-04-29 ENCOUNTER — Ambulatory Visit (HOSPITAL_COMMUNITY): Payer: Medicaid - Out of State

## 2016-05-10 NOTE — Progress Notes (Signed)
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 Denman George      02/18/2014 Pathology Results    Cervix, biopsy - SQUAMOUS CELL CARCINOMA.      03/07/2014 - 04/24/2014 Radiation Therapy    Dr. Sondra Come- vaginal brachytherapy, 45 Gy to pelvis with sidewall boost 9 Gy and HDR 28.5 Gy in 5 fractions      03/08/2014 - 04/03/2014 Chemotherapy    Weekly CDDP 40 mg/m2 by Dr. 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      07/12/2014 PET scan    Marked interval decrease in size and hypermetabolism of the cervical mass.  The bilateral hypermetabolic pelvic sidewall lymphadenopathy has resolved in the interval.  Areas of hypermetabolic brown fat bilaterally in the chest and abdomen.      11/05/2015 Imaging    MRI abdomen at [facility]- 7.0 x 5.3 cm are in left hepatic lobe with central 3.1 x 2.7 x 2.3 cm complex cystic area, as well as an apparent subcapsular foci      11/26/2015 Procedure    US aspiration of left hepatic lobe- yielding 13 cc of yellow purulent fluid (pain resolved and PO intake increased)      11/26/2015 Pathology Results    [id number]- highly suspicious for squamous cell carcinoma      12/09/2015 PET scan    New small hypermetabolic right internal mammary soft tissue density, suspicious for metastatic lymphadenopathy.  Increased size and number of hypermetabolic liver metastases. New mild hypermetabolic activity in porta hepatis is suspicious for lymph node metastases.  No residual hypermetabolic cervical  mass identified. Multiple uterine fibroids again seen without FDG uptake.  New diffuse rectal wall thickening with hypermetabolic activity, suspicious for proctitis which could be infectious in etiology or secondary to radiation changes ; recommend clinical correlation. Small pelvic fluid collection between the anterior rectal wall and posterior uterine wall, possibly due to abscess.      12/12/2015 Initial Diagnosis    Recurrent cervical cancer ([place])     01/02/2016 Procedure    Port placed by Dr. Ladona Horns      01/09/2016 Procedure    US biopsy      01/12/2016 Pathology Results    Diagnosis Liver, needle/core biopsy - METASTATIC SQUAMOUS CELL CARCINOMA.      01/21/2016 -  Chemotherapy    The patient had palonosetron (ALOXI) injection 0.25 mg, 0.25 mg, Intravenous,  Once, 0 of 4 cycles  bevacizumab (AVASTIN) 625 mg in sodium chloride 0.9 % 100 mL chemo infusion, 15 mg/kg = 625 mg (100 % of original dose 15 mg/kg), Intravenous,  Once, 0 of 4 cycles Dose modification: 15 mg/kg (original dose 15 mg/kg, Cycle 1, Reason: Provider Judgment)  CARBOplatin (PARAPLATIN) in sodium chloride 0.9 % 100 mL chemo infusion,  (original dose ), Intravenous,  Once, 0 of 4 cycles Dose modification:   (Cycle 1)  PACLitaxel (TAXOL) 234 mg in dextrose 5 % 250 mL chemo infusion (> 80mg /m2), 175 mg/m2 = 234 mg, Intravenous,  Once, 0 of  4 cycles  for chemotherapy treatment.        04/08/2016 PET scan    Much improved PET-CT when compared to prior study from 2017. There are 2 slightly hypermetabolic residual hepatic lesions.  Diffuse marrow activity likely due to rebound or marrow stimulating drugs.        INTERVAL HISTORY:  Ms. Huseby 41 y.o. female returns for follow-up for metastatic cervical cancer with liver mets. She is here for consideration for cycle #6 Carbo/Taxol/Avastin.   We have been giving her twice weekly IVF for decreased oral intake. She is here today with her boyfriend and  tells me that she feels really well today; she does not want chemotherapy today because she is planning to cook for her daughter and does not want to feel bad for the rest of the week.   She tells me she has been eating more, particularly in the past week or so.  Her weight is down ~16 lbs since 03/03/16. She attributes this to decreased appetite, taste changes, and nausea. She feels that she has finally found the best antiemetic regimen with the Zofran tablets (not the ODT Zofran d/t taste of medication making her nausea worse).  She continues to have lower abdominal/pelvic pain at 6/10; she has run out of her Norco. She tells me that the pharmacy has a prescription, but they are refusing to fill it before 05/17/16.  She has been taking Aleve for pain, which is only minimally effective.  Endorses constipation; she takes Dulcolax and Senokot, which is effective.  She has chronic SOB, which is no worse than previous.    Otherwise, she is largely without complaints and feels well.  She would like to postpone her chemotherapy to later in the week so she can spend time with her daughters "while I still feel good."        REVIEW OF SYSTEMS:  Review of Systems  Constitutional: Negative for chills and fever.  HENT:  Negative.   Eyes: Negative.   Respiratory: Positive for shortness of breath (chronic).   Cardiovascular: Negative.   Gastrointestinal: Positive for abdominal pain, constipation and nausea ("it's nothing major" ). Negative for blood in stool, diarrhea and vomiting.  Endocrine: Negative.   Genitourinary: Positive for pelvic pain (managed with Norco and Aleve). Negative for dysuria, hematuria, menstrual problem and vaginal bleeding.   Musculoskeletal: Negative.   Skin: Negative.  Negative for rash.  Neurological: Negative.  Negative for dizziness and headaches.  Psychiatric/Behavioral: Positive for depression ("I get down sometimes, but I'm doing okay and am looking forward to finishing up  chemo." ).     PAST MEDICAL/SURGICAL HISTORY:  Past Medical History:  Diagnosis Date  . Anxiety   . Cervical cancer ([place])   . Cervical cancer, FIGO stage IIB ([place]) 04/09/2014  . Family history of adverse reaction to anesthesia    pt states her sister has to have the "reversal" after surgery   . Hypokalemia   . Microcytic hypochromic anemia   . Recurrent cervical cancer ([place]) 12/12/2015   Past Surgical History:  Procedure Laterality Date  . CESAREAN SECTION    . port a cath placement      January 2016 / right   . TANDEM RING INSERTION N/A 04/09/2014   Procedure: TANDEM RING PLACEMENT FOR HIGH DOSE RATE RADIATION THERAPY EXAM UNDER ANETHESIA PLACEMENT OF CERVICAL SLEEVE;  Surgeon: Gery Pray, MD;  Location: [facility];  Service: Urology;  Laterality: N/A;  . TANDEM RING INSERTION N/A 04/23/2014  Procedure: TANDEM RING PLACEMENT ;  Surgeon: Gery Pray, MD;  Location: [facility];  Service: Urology;  Laterality: N/A;  . TANDEM RING INSERTION N/A 04/30/2014   Procedure: TANDEM RING PLACEMENT,EXAM UNDER ANESTHESIA ;  Surgeon: Gery Pray, MD;  Location: [facility];  Service: Urology;  Laterality: N/A;  . TANDEM RING INSERTION N/A 05/07/2014   Procedure: TANDEM RING PLACEMENT;  Surgeon: Gery Pray, MD;  Location: [facility];  Service: Urology;  Laterality: N/A;  . TANDEM RING INSERTION N/A 05/17/2014   Procedure: TANDEM RING INSERTION;  Surgeon: Gery Pray, MD;  Location[facility];  Service: Urology;  Laterality: N/A;  . TUBAL LIGATION       SOCIAL HISTORY:  Social History   Social History  . Marital status: Single    Spouse name: N/A  . Number of children: 2  . Years of education: N/A   Occupational History  . cook at [employer] History Main Topics  . Smoking status: Former Smoker    Packs/day: 0.50    Years: 6.00    Types: Cigarettes    Quit date: 08/02/2011  . Smokeless tobacco: Never Used  . Alcohol use No  . Drug use: Yes    Types: Marijuana  . Sexual  activity: Yes   Other Topics Concern  . Not on file   Social History Narrative  . No narrative on file    FAMILY HISTORY:  Family History  Problem Relation Age of Onset  . Asthma Mother   . Heart attack Father     CURRENT MEDICATIONS:  Outpatient Encounter Prescriptions as of 05/11/2016  Medication Sig Note  . BEVACIZUMAB IV Inject into the vein. 01/02/2016: Med has not been started  . calcium-vitamin D (OSCAL 500/200 D-3) 500-200 MG-UNIT per tablet Take 1 tablet by mouth 2 (two) times daily.   Marland Kitchen CARBOPLATIN IV Inject into the vein. 01/02/2016: Med has not been started  . dexamethasone (DECADRON) 4 MG tablet Take 2 tablets (8 mg total) by mouth daily. Start the day after chemotherapy for 2 days. 01/02/2016: Have not started  . estrogen, conjugated,-medroxyprogesterone (PREMPRO) 0.3-1.5 MG per tablet Take 1 tablet by mouth daily.   . ferrous sulfate 325 (65 FE) MG tablet Take 325 mg by mouth daily with breakfast. 12/12/2015: Pt taking daily  . HYDROcodone-acetaminophen (NORCO) 7.5-325 MG tablet Take 1 tablet by mouth every 4 (four) hours as needed for moderate pain.   Marland Kitchen lidocaine-prilocaine (EMLA) cream Apply to affected area once 01/02/2016: Have not started  . LORazepam (ATIVAN) 1 MG tablet TAKE 1 TABLET BY MOUTH AT BEDTIME AS NEEDED FOR ANXIETY /INSOMNIA   . naproxen sodium (ANAPROX) 220 MG tablet Take 220 mg by mouth 4 (four) times daily as needed.   . ondansetron (ZOFRAN) 8 MG tablet Take 1 tablet (8 mg total) by mouth 2 (two) times daily as needed for refractory nausea / vomiting. Start on day 3 after chemo.   Marland Kitchen PACLITAXEL IV Inject into the vein. 01/02/2016: Med has not been started  . prochlorperazine (COMPAZINE) 10 MG tablet Take 1 tablet (10 mg total) by mouth every 6 (six) hours as needed (Nausea or vomiting).   . [DISCONTINUED] HYDROcodone-acetaminophen (NORCO) 7.5-325 MG tablet Take 1 tablet by mouth every 4 (four) hours as needed for moderate pain.   . [DISCONTINUED] morphine  (MS CONTIN) 15 MG 12 hr tablet Take 1 tablet (15 mg total) by mouth every 12 (twelve) hours.   . [DISCONTINUED] potassium chloride SA (K-DUR,KLOR-CON)  20 MEQ tablet Take 1 tablet (20 mEq total) by mouth 2 (two) times daily.   . Potassium Chloride 40 MEQ/15ML (20%) SOLN Take 40 mEq by mouth 2 (two) times daily.    No facility-administered encounter medications on file as of 05/11/2016.     ALLERGIES:  No Known Allergies   PHYSICAL EXAM:  ECOG Performance status: 1 - Symptomatic; largely independent.   Vitals:   05/11/16 1127  BP: 121/79  Pulse: (!) 104  Resp: 16  Temp: 98.1 F (36.7 C)   Filed Weights   05/11/16 1127  Weight: 71 lb (32.2 kg)    Physical Exam  Constitutional: She is oriented to person, place, and time.  Thin/cachetic female in no distress   HENT:  Head: Normocephalic.  Mouth/Throat: Oropharynx is clear and moist. No oropharyngeal exudate.  Eyes: Conjunctivae are normal. Pupils are equal, round, and reactive to light. No scleral icterus.  Neck: Normal range of motion. Neck supple.  Cardiovascular: Regular rhythm and normal heart sounds.   Mild tachycardia   Pulmonary/Chest: Effort normal.  Diminished breath sounds bilat bases   Abdominal: Soft. Bowel sounds are normal. There is no tenderness. There is no rebound and no guarding.  Musculoskeletal: Normal range of motion. She exhibits no edema.  Lymphadenopathy:    She has no cervical adenopathy.  Neurological: She is alert and oriented to person, place, and time. No cranial nerve deficit. Gait normal.  Skin: Skin is warm and dry. No rash noted.  Psychiatric: Mood, memory, affect and judgment normal.     LABORATORY DATA:  I have reviewed the labs as listed.  CBC    Component Value Date/Time   WBC 11.1 (H) 05/11/2016 1133   RBC 2.70 (L) 05/11/2016 1133   HGB 9.2 (L) 05/11/2016 1133   HGB 12.0 12/12/2015 0920   HCT 28.7 (L) 05/11/2016 1133   HCT 37.6 12/12/2015 0920   PLT 339 05/11/2016 1133   PLT  289 12/12/2015 0920   MCV 106.3 (H) 05/11/2016 1133   MCV 89.1 12/12/2015 0920   MCH 34.1 (H) 05/11/2016 1133   MCHC 32.1 05/11/2016 1133   RDW 16.8 (H) 05/11/2016 1133   RDW 15.5 (H) 12/12/2015 0920   LYMPHSABS 1.2 05/11/2016 1133   LYMPHSABS 1.0 12/12/2015 0920   MONOABS 1.0 05/11/2016 1133   MONOABS 0.3 12/12/2015 0920   EOSABS 0.0 05/11/2016 1133   EOSABS 0.0 12/12/2015 0920   BASOSABS 0.0 05/11/2016 1133   BASOSABS 0.0 12/12/2015 0920   CMP Latest Ref Rng & Units 05/11/2016 04/20/2016 03/30/2016  Glucose 65 - 99 mg/dL 89 94 93  BUN 6 - 20 mg/dL 8 13 11   Creatinine 0.44 - 1.00 mg/dL 0.43(L) 0.54 0.53  Sodium 135 - 145 mmol/L 137 137 135  Potassium 3.5 - 5.1 mmol/L 2.7(LL) 2.9(L) 3.9  Chloride 101 - 111 mmol/L 95(L) 96(L) 98(L)  CO2 22 - 32 mmol/L 34(H) 32 29  Calcium 8.9 - 10.3 mg/dL 8.9 9.0 9.4  Total Protein 6.5 - 8.1 g/dL 7.1 7.4 8.0  Total Bilirubin 0.3 - 1.2 mg/dL 0.5 0.4 0.3  Alkaline Phos 38 - 126 U/L 107 98 89  AST 15 - 41 U/L 14(L) 12(L) 14(L)  ALT 14 - 54 U/L 9(L) 7(L) 7(L)     PENDING LABS:  CA-125 pending    DIAGNOSTIC IMAGING:  Most recent PET scan: 04/08/16     PATHOLOGY:  Cervical biopsy: 02/16/14    Liver biopsy: 01/09/16        ASSESSMENT &  PLAN:   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  Labs reviewed and would be adequate for treatment, however patient is asking that treatment be delayed a few days.  -Patient asks that chemo be delayed a few days so she can enjoy some activities with her daughter; discussed with Dr. Talbert Cage.  Ms. Deblois will return to cancer center on Friday, 05/14/16 for cycle #6 Carbo/Taxol/Avastin.  I expressed understanding in patient's wishes to postpone her treatment a few days so she can spend some quality time with her children; this is very appropriate as she has certainly had some challenges during her chemotherapy course and "I finally feel good."   Stressed the importance of completing cycle #6 of chemotherapy before upcoming restaging PET scan; she agreed.  -CA-125 pending; serial tumor markers have been decreasing slowly.  -Return to cancer center on Friday, 05/14/16 for cycle #6 chemotherapy.  -Restaging PET scan scheduled for 05/24/16, along with follow-up visit with Dr. Denman George with Gyn-Onc.  -We will schedule a follow-up visit for her here at the cancer center in 1 month for continued treatment discussion/planning.   Weight loss:  -Last PET scan on 04/08/16 reassuring that her continued weight loss is not secondary to disease burden. However, I stressed how critical it is that she not lose any additional weight.  She and her family are actively working on her oral intake.   -Her albumin is quite low at 2.6 today as well. Encouraged continued high calorie/high protein intake.  Neoplasm related pain:  -She was started on MS Contin 15 mg Q12H, but she has not taken it because "I'm too scared of the side effects."  She continues on the Norco, until she recently had issues with the pharmacy not refilling the medications.  She usually requires 2-3 Norco/day.   -[place] Controlled Substance Registry reviewed. Her last Norco prescription was filled on 04/16/16; this would have been a 15-day supply, therefore refill is appropriate at this time.  Paper prescription for Norco 7.5/325 Q4Hprn, #90, no refills given to patient; notation on prescription "early refill authorization, may fill 05/11/16 or after."   -Reports that she has been having to pay cash for the Norco at her [employer] in [place]; our patient advocate, Janace Hoard, has helped the patient in the past with this, but it has been unsuccessful.  Recommended patient try a new pharmacy and let us know if she continues to have issues.      Hypokalemia:  -Alert potassium 2.7 today. Denies any chest pain or myalgias.  She has not been taking her oral potassium supplementation at home; she  stopped taking it because her potassium was normal and she didn't think she needed the pills any longer.  They are also difficult for her to swallow. She requests liquid potassium supplementation.  -E-scribed oral potassium solution 40 mEq/15 mL, take 15 mL po BID (total 80 mEq/day). We will recheck her electrolytes on Friday, 05/14/16 when she returns to cancer center for chemotherapy.     Dispo:  -Chemo held today d/t patient preference. Return to cancer center on Friday, 05/14/16 for next cycle chemotherapy. -PET scan scheduled for 05/24/16, along with follow-up visit with Dr. Denman George for further treatment plan discussion.  -Return to cancer center per results of PET scan and recommendations from Dr. Denman George.      All questions were answered to patient's stated satisfaction. Encouraged patient to call with any new concerns or questions before her  next visit to the cancer center and we can certain see her sooner, if needed.    Plan of care discussed with Dr. Talbert Cage, who agrees with the above aforementioned.     Orders placed this encounter:  No orders of the defined types were placed in this encounter.     Mike Craze, NP [place] [phone]

## 2016-05-11 ENCOUNTER — Encounter (HOSPITAL_BASED_OUTPATIENT_CLINIC_OR_DEPARTMENT_OTHER): Payer: Medicaid - Out of State

## 2016-05-11 ENCOUNTER — Encounter (HOSPITAL_COMMUNITY): Payer: Medicaid - Out of State

## 2016-05-11 ENCOUNTER — Encounter (HOSPITAL_COMMUNITY): Payer: Medicaid - Out of State | Attending: Oncology | Admitting: Adult Health

## 2016-05-11 ENCOUNTER — Encounter (HOSPITAL_COMMUNITY): Payer: Self-pay | Admitting: Adult Health

## 2016-05-11 VITALS — BP 121/79 | HR 104 | Temp 98.1°F | Resp 16 | Ht 60.0 in | Wt 71.0 lb

## 2016-05-11 DIAGNOSIS — C539 Malignant neoplasm of cervix uteri, unspecified: Secondary | ICD-10-CM

## 2016-05-11 DIAGNOSIS — C787 Secondary malignant neoplasm of liver and intrahepatic bile duct: Secondary | ICD-10-CM

## 2016-05-11 DIAGNOSIS — K5903 Drug induced constipation: Secondary | ICD-10-CM | POA: Diagnosis not present

## 2016-05-11 DIAGNOSIS — R634 Abnormal weight loss: Secondary | ICD-10-CM

## 2016-05-11 DIAGNOSIS — G893 Neoplasm related pain (acute) (chronic): Secondary | ICD-10-CM

## 2016-05-11 DIAGNOSIS — K769 Liver disease, unspecified: Secondary | ICD-10-CM | POA: Insufficient documentation

## 2016-05-11 DIAGNOSIS — Z87891 Personal history of nicotine dependence: Secondary | ICD-10-CM | POA: Insufficient documentation

## 2016-05-11 DIAGNOSIS — F419 Anxiety disorder, unspecified: Secondary | ICD-10-CM | POA: Insufficient documentation

## 2016-05-11 DIAGNOSIS — E876 Hypokalemia: Secondary | ICD-10-CM | POA: Insufficient documentation

## 2016-05-11 LAB — COMPREHENSIVE METABOLIC PANEL
ALK PHOS: 107 U/L (ref 38–126)
ALT: 9 U/L — ABNORMAL LOW (ref 14–54)
ANION GAP: 8 (ref 5–15)
AST: 14 U/L — ABNORMAL LOW (ref 15–41)
Albumin: 2.6 g/dL — ABNORMAL LOW (ref 3.5–5.0)
BILIRUBIN TOTAL: 0.5 mg/dL (ref 0.3–1.2)
BUN: 8 mg/dL (ref 6–20)
CALCIUM: 8.9 mg/dL (ref 8.9–10.3)
CO2: 34 mmol/L — ABNORMAL HIGH (ref 22–32)
Chloride: 95 mmol/L — ABNORMAL LOW (ref 101–111)
Creatinine, Ser: 0.43 mg/dL — ABNORMAL LOW (ref 0.44–1.00)
GFR calc non Af Amer: >60 mL/min (ref >60)
Glucose, Bld: 89 mg/dL (ref 65–99)
POTASSIUM: 2.7 mmol/L — AB (ref 3.5–5.1)
SODIUM: 137 mmol/L (ref 135–145)
TOTAL PROTEIN: 7.1 g/dL (ref 6.5–8.1)

## 2016-05-11 LAB — SAMPLE TO BLOOD BANK

## 2016-05-11 LAB — CBC WITH DIFFERENTIAL/PLATELET
BASOS PCT: 0 %
Basophils Absolute: 0 10*3/uL (ref 0.0–0.1)
EOS ABS: 0 10*3/uL (ref 0.0–0.7)
Eosinophils Relative: 0 %
HEMATOCRIT: 28.7 % — AB (ref 36.0–46.0)
HEMOGLOBIN: 9.2 g/dL — AB (ref 12.0–15.0)
LYMPHS ABS: 1.2 10*3/uL (ref 0.7–4.0)
Lymphocytes Relative: 10 %
MCH: 34.1 pg — AB (ref 26.0–34.0)
MCHC: 32.1 g/dL (ref 30.0–36.0)
MCV: 106.3 fL — ABNORMAL HIGH (ref 78.0–100.0)
MONO ABS: 1 10*3/uL (ref 0.1–1.0)
MONOS PCT: 9 %
NEUTROS PCT: 81 %
Neutro Abs: 8.9 10*3/uL — ABNORMAL HIGH (ref 1.7–7.7)
Platelets: 339 10*3/uL (ref 150–400)
RBC: 2.7 MIL/uL — ABNORMAL LOW (ref 3.87–5.11)
RDW: 16.8 % — AB (ref 11.5–15.5)
WBC: 11.1 10*3/uL — ABNORMAL HIGH (ref 4.0–10.5)

## 2016-05-11 LAB — MAGNESIUM: MAGNESIUM: 1.8 mg/dL (ref 1.7–2.4)

## 2016-05-11 LAB — PHOSPHORUS: Phosphorus: 2.5 mg/dL (ref 2.5–4.6)

## 2016-05-11 MED ORDER — POTASSIUM CHLORIDE 40 MEQ/15ML (20%) PO SOLN: 40.0000 meq | Freq: Two times a day (BID) | ORAL | 2 refills | Status: DC

## 2016-05-11 MED ORDER — HEPARIN SOD (PORK) LOCK FLUSH 100 UNIT/ML IV SOLN
500.0000 [IU] | Freq: Once | INTRAVENOUS | Status: AC
  Administered 2016-05-11: 500 [IU] via INTRAVENOUS

## 2016-05-11 MED ORDER — HYDROCODONE-ACETAMINOPHEN 7.5-325 MG PO TABS: 1.0000 | ORAL_TABLET | ORAL | 0 refills | Status: DC | PRN

## 2016-05-11 MED ORDER — HEPARIN SOD (PORK) LOCK FLUSH 100 UNIT/ML IV SOLN
INTRAVENOUS | Status: AC
Start: 2016-05-11 — End: 2016-05-11
  Filled 2016-05-11: qty 5

## 2016-05-11 MED ORDER — SODIUM CHLORIDE 0.9% FLUSH
20.0000 mL | INTRAVENOUS | Status: DC | PRN
  Administered 2016-05-11: 20 mL via INTRAVENOUS
  Filled 2016-05-11: qty 20

## 2016-05-11 NOTE — Patient Instructions (Signed)
[facility] at [facility] Discharge Instructions  RECOMMENDATIONS MADE BY THE CONSULTANT AND ANY TEST RESULTS WILL BE SENT TO YOUR REFERRING PHYSICIAN.  You were seen today by Gretchen Dawson NP.   Thank you for choosing [facility] at [facility] to provide your oncology and hematology care.  To afford each patient quality time with our provider, please arrive at least 15 minutes before your scheduled appointment time.    If you have a lab appointment with the Cancer Center please come in thru the  Main Entrance and check in at the main information desk  You need to re-schedule your appointment should you arrive 10 or more minutes late.  We strive to give you quality time with our providers, and arriving late affects you and other patients whose appointments are after yours.  Also, if you no show three or more times for appointments you may be dismissed from the clinic at the providers discretion.     Again, thank you for choosing [facility].  Our hope is that these requests will decrease the amount of time that you wait before being seen by our physicians.       _____________________________________________________________  Should you have questions after your visit to [facility], please contact our office at [phone] between the hours of 8:30 a.m. and 4:30 p.m.  Voicemails left after 4:30 p.m. will not be returned until the following business day.  For prescription refill requests, have your pharmacy contact our office.       Resources For Cancer Patients and their Caregivers ? American Cancer Society: Can assist with transportation, wigs, general needs, runs Look Good Feel Better.        1-888-227-6333 ? Cancer Care: Provides financial assistance, online support groups, medication/co-pay assistance.  1-800-813-HOPE (4673) ? [facility] Assists [place] cancer patients and  their families through emotional , educational and financial support.  336-427-4357 ? [place] DSS Where to apply for food stamps, Medicaid and utility assistance. 336-342-1394 ? [facility]: Transportation to medical appointments. 336-347-2287 ? Social Security Administration: May apply for disability if have a Stage IV cancer. 336-342-7796 1-800-772-1213 ? [place] Aging, Disability and Transit Services: Assists with nutrition, care and transit needs. 336-349-2343  Cancer Center Support Programs: @10RELATIVEDAYS@ > Cancer Support Group  2nd Tuesday of the month 1pm-2pm, Journey Room  > Creative Journey  3rd Tuesday of the month 1130am-1pm, Journey Room  > Look Good Feel Better  1st Wednesday of the month 10am-12 noon, Journey Room (Call American Cancer [employer] to register 1-800-395-5775)    

## 2016-05-11 NOTE — Patient Instructions (Signed)
[facility] at [facility] Discharge Instructions  RECOMMENDATIONS MADE BY THE CONSULTANT AND ANY TEST RESULTS WILL BE SENT TO YOUR REFERRING PHYSICIAN.  Labs drawn from portacath then flushed per protocol. Follow-up as scheduled. Call clinic for any questions or concerns  Thank you for choosing [facility] at [facility] to provide your oncology and hematology care.  To afford each patient quality time with our provider, please arrive at least 15 minutes before your scheduled appointment time.    If you have a lab appointment with the Cancer Center please come in thru the  Main Entrance and check in at the main information desk  You need to re-schedule your appointment should you arrive 10 or more minutes late.  We strive to give you quality time with our providers, and arriving late affects you and other patients whose appointments are after yours.  Also, if you no show three or more times for appointments you may be dismissed from the clinic at the providers discretion.     Again, thank you for choosing [facility].  Our hope is that these requests will decrease the amount of time that you wait before being seen by our physicians.       _____________________________________________________________  Should you have questions after your visit to [facility], please contact our office at [phone] between the hours of 8:30 a.m. and 4:30 p.m.  Voicemails left after 4:30 p.m. will not be returned until the following business day.  For prescription refill requests, have your pharmacy contact our office.       Resources For Cancer Patients and their Caregivers ? American Cancer Society: Can assist with transportation, wigs, general needs, runs Look Good Feel Better.        1-888-227-6333 ? Cancer Care: Provides financial assistance, online support groups, medication/co-pay assistance.  1-800-813-HOPE (4673) ? [facility] Assists [place] cancer patients and their families through emotional , educational and financial support.  336-427-4357 ? [place] DSS Where to apply for food stamps, Medicaid and utility assistance. 336-342-1394 ? RCATS: Transportation to medical appointments. 336-347-2287 ? Social Security Administration: May apply for disability if have a Stage IV cancer. 336-342-7796 1-800-772-1213 ? [place] Aging, Disability and Transit Services: Assists with nutrition, care and transit needs. 336-349-2343  Cancer Center Support Programs: @10RELATIVEDAYS@ > Cancer Support Group  2nd Tuesday of the month 1pm-2pm, Journey Room  > Creative Journey  3rd Tuesday of the month 1130am-1pm, Journey Room  > Look Good Feel Better  1st Wednesday of the month 10am-12 noon, Journey Room (Call American Cancer Society to register 1-800-395-5775)   

## 2016-05-11 NOTE — Progress Notes (Signed)
Erika [name] tolerated port lab draw with flush well without complaints or incident. Port accessed with 20 gauge needle with good blood return noted then flushed with 20 ml NS and 5 ml Heparin easily per protocol. Pt discharged self ambulatory in satisfactory condition accompanied by boyfriend

## 2016-05-11 NOTE — Progress Notes (Signed)
CRITICAL VALUE ALERT Critical value received:  K+ 2.7 Date of notification:  05/11/16 Time of notification: 1229 Critical value read back:  Yes.   Nurse who received alert:  M.Marenda Accardi, LPN MD notified (1st page):  G.Dawson,NP

## 2016-05-12 LAB — CA 125: CA 125: 9.6 U/mL (ref 0.0–38.1)

## 2016-05-14 ENCOUNTER — Encounter (HOSPITAL_BASED_OUTPATIENT_CLINIC_OR_DEPARTMENT_OTHER): Payer: Medicaid - Out of State

## 2016-05-14 ENCOUNTER — Encounter (HOSPITAL_COMMUNITY): Payer: Self-pay

## 2016-05-14 VITALS — BP 118/83 | HR 90 | Temp 98.6°F | Resp 16 | Wt 73.4 lb

## 2016-05-14 DIAGNOSIS — C787 Secondary malignant neoplasm of liver and intrahepatic bile duct: Secondary | ICD-10-CM

## 2016-05-14 DIAGNOSIS — Z5111 Encounter for antineoplastic chemotherapy: Secondary | ICD-10-CM

## 2016-05-14 DIAGNOSIS — Z5112 Encounter for antineoplastic immunotherapy: Secondary | ICD-10-CM | POA: Diagnosis not present

## 2016-05-14 DIAGNOSIS — C539 Malignant neoplasm of cervix uteri, unspecified: Secondary | ICD-10-CM

## 2016-05-14 LAB — COMPREHENSIVE METABOLIC PANEL
ALK PHOS: 106 U/L (ref 38–126)
ALT: 10 U/L — ABNORMAL LOW (ref 14–54)
ANION GAP: 9 (ref 5–15)
AST: 15 U/L (ref 15–41)
Albumin: 2.7 g/dL — ABNORMAL LOW (ref 3.5–5.0)
BUN: 9 mg/dL (ref 6–20)
CALCIUM: 8.9 mg/dL (ref 8.9–10.3)
CO2: 32 mmol/L (ref 22–32)
Chloride: 93 mmol/L — ABNORMAL LOW (ref 101–111)
Creatinine, Ser: 0.53 mg/dL (ref 0.44–1.00)
Glucose, Bld: 89 mg/dL (ref 65–99)
Potassium: 2.9 mmol/L — ABNORMAL LOW (ref 3.5–5.1)
SODIUM: 134 mmol/L — AB (ref 135–145)
TOTAL PROTEIN: 7.2 g/dL (ref 6.5–8.1)
Total Bilirubin: 0.4 mg/dL (ref 0.3–1.2)

## 2016-05-14 LAB — URINALYSIS, DIPSTICK ONLY
Glucose, UA: NEGATIVE mg/dL
NITRITE: NEGATIVE
PH: 6 (ref 5.0–8.0)
PROTEIN: 30 mg/dL — AB
Specific Gravity, Urine: 1.025 (ref 1.005–1.030)

## 2016-05-14 LAB — CBC WITH DIFFERENTIAL/PLATELET
Basophils Absolute: 0 10*3/uL (ref 0.0–0.1)
Basophils Relative: 0 %
EOS ABS: 0 10*3/uL (ref 0.0–0.7)
EOS PCT: 0 %
HCT: 29 % — ABNORMAL LOW (ref 36.0–46.0)
HEMOGLOBIN: 9.3 g/dL — AB (ref 12.0–15.0)
Lymphocytes Relative: 10 %
Lymphs Abs: 1.3 10*3/uL (ref 0.7–4.0)
MCH: 34.3 pg — AB (ref 26.0–34.0)
MCHC: 32.1 g/dL (ref 30.0–36.0)
MCV: 107 fL — ABNORMAL HIGH (ref 78.0–100.0)
MONOS PCT: 12 %
Monocytes Absolute: 1.7 10*3/uL — ABNORMAL HIGH (ref 0.1–1.0)
Neutro Abs: 10.8 10*3/uL — ABNORMAL HIGH (ref 1.7–7.7)
Neutrophils Relative %: 78 %
Platelets: 341 10*3/uL (ref 150–400)
RBC: 2.71 MIL/uL — ABNORMAL LOW (ref 3.87–5.11)
RDW: 17.2 % — ABNORMAL HIGH (ref 11.5–15.5)
WBC: 13.8 10*3/uL — ABNORMAL HIGH (ref 4.0–10.5)

## 2016-05-14 MED ORDER — SODIUM CHLORIDE 0.9 % IV SOLN
20.0000 mg | Freq: Once | INTRAVENOUS | Status: AC
  Administered 2016-05-14: 20 mg via INTRAVENOUS
  Filled 2016-05-14: qty 2

## 2016-05-14 MED ORDER — PEGFILGRASTIM 6 MG/0.6ML ~~LOC~~ PSKT
6.0000 mg | PREFILLED_SYRINGE | Freq: Once | SUBCUTANEOUS | Status: AC
  Administered 2016-05-14: 6 mg via SUBCUTANEOUS
  Filled 2016-05-14: qty 0.6

## 2016-05-14 MED ORDER — DIPHENHYDRAMINE HCL 50 MG/ML IJ SOLN
50.0000 mg | Freq: Once | INTRAMUSCULAR | Status: DC
  Filled 2016-05-14: qty 1

## 2016-05-14 MED ORDER — SODIUM CHLORIDE 0.9 % IV SOLN
14.0000 mg/kg | Freq: Once | INTRAVENOUS | Status: AC
  Administered 2016-05-14: 500 mg via INTRAVENOUS
  Filled 2016-05-14: qty 16

## 2016-05-14 MED ORDER — SODIUM CHLORIDE 0.9 % IV SOLN
393.5000 mg | Freq: Once | INTRAVENOUS | Status: AC
  Administered 2016-05-14: 390 mg via INTRAVENOUS
  Filled 2016-05-14: qty 39

## 2016-05-14 MED ORDER — PALONOSETRON HCL INJECTION 0.25 MG/5ML
0.2500 mg | Freq: Once | INTRAVENOUS | Status: AC
  Administered 2016-05-14: 0.25 mg via INTRAVENOUS
  Filled 2016-05-14: qty 5

## 2016-05-14 MED ORDER — POTASSIUM CHLORIDE 2 MEQ/ML IV SOLN
Freq: Once | INTRAVENOUS | Status: AC
  Administered 2016-05-14: 13:00:00 via INTRAVENOUS
  Filled 2016-05-14: qty 500

## 2016-05-14 MED ORDER — PACLITAXEL CHEMO INJECTION 300 MG/50ML
175.0000 mg/m2 | Freq: Once | INTRAVENOUS | Status: AC
  Administered 2016-05-14: 216 mg via INTRAVENOUS
  Filled 2016-05-14: qty 36

## 2016-05-14 MED ORDER — HEPARIN SOD (PORK) LOCK FLUSH 100 UNIT/ML IV SOLN
500.0000 [IU] | Freq: Once | INTRAVENOUS | Status: AC | PRN
  Administered 2016-05-14: 500 [IU]
  Filled 2016-05-14 (×2): qty 5

## 2016-05-14 MED ORDER — SODIUM CHLORIDE 0.9% FLUSH: 10.0000 mL | INTRAVENOUS | Status: DC | PRN

## 2016-05-14 MED ORDER — POTASSIUM CHLORIDE CRYS ER 20 MEQ PO TBCR: 40.0000 meq | EXTENDED_RELEASE_TABLET | Freq: Once | ORAL | Status: DC

## 2016-05-14 MED ORDER — DIPHENHYDRAMINE HCL 50 MG/ML IJ SOLN
25.0000 mg | Freq: Once | INTRAMUSCULAR | Status: AC
  Administered 2016-05-14: 25 mg via INTRAVENOUS

## 2016-05-14 MED ORDER — FAMOTIDINE IN NACL 20-0.9 MG/50ML-% IV SOLN
20.0000 mg | Freq: Once | INTRAVENOUS | Status: AC
  Administered 2016-05-14: 20 mg via INTRAVENOUS
  Filled 2016-05-14: qty 50

## 2016-05-14 MED ORDER — SODIUM CHLORIDE 0.9 % IV SOLN
Freq: Once | INTRAVENOUS | Status: AC
  Administered 2016-05-14: 12:00:00 via INTRAVENOUS

## 2016-05-14 NOTE — Progress Notes (Signed)
Labs printed and reviewed with Mike Craze, NP.  Continued hypokalemia noted and patient to get IV potassium replacement today with her chemotherapy.  Patient reports that she did fill the liquid potassium and is taking it mixed in fruit punch and tolerating it, however, she does have some burning in her abdomen when she takes it.  Encouraged patient to continue taking it as directed and that hopefully when we get her levels normal, the provider can reduce her dose and thus reduce the stomach discomfort.  She verbalized understanding.  Premedication benadryl changed to 25mg  IV from the previous 50mg  IV, per patient preference because of how it made her feel and recommendation from pharmacist.  Per Dr. Talbert Cage and Mike Craze, NP, Sharyn Lull B. will call at the end of next week to check to be sure the patient is continuing to take her potassium.  No lab recheck per MD and NP.  Patient to return in May 2018. Applied ONPRO neulasta on body injector. See MAR for administration details. Injector in place and engaged with green light indicator on flashing. Tolerated application with out problems.  Patient tolerated infusion well.  VSS and patient ambulatory and stable upon discharge from clinic.

## 2016-05-14 NOTE — Patient Instructions (Signed)
[facility] Discharge Instructions for Patients Receiving Chemotherapy   Beginning January 23rd 2017 lab work for the [facility] will be done in the  Main lab at [facility] on 1st floor. If you have a lab appointment with the [place] please come in thru the  Main Entrance and check in at the main information desk   Today you received the following chemotherapy agents: Taxol, Carboplatin, Avastin, and potassium IV.    If you develop nausea and vomiting, or diarrhea that is not controlled by your medication, call the clinic.  The clinic phone number is [phone]. Office hours are Monday-Friday 8:30am-5:00pm.  BELOW ARE SYMPTOMS THAT SHOULD BE REPORTED IMMEDIATELY:  *FEVER GREATER THAN 101.0 F  *CHILLS WITH OR WITHOUT FEVER  NAUSEA AND VOMITING THAT IS NOT CONTROLLED WITH YOUR NAUSEA MEDICATION  *UNUSUAL SHORTNESS OF BREATH  *UNUSUAL BRUISING OR BLEEDING  TENDERNESS IN MOUTH AND THROAT WITH OR WITHOUT PRESENCE OF ULCERS  *URINARY PROBLEMS  *BOWEL PROBLEMS  UNUSUAL RASH Items with * indicate a potential emergency and should be followed up as soon as possible. If you have an emergency after office hours please contact your primary care physician or go to the nearest emergency department.  Please call the clinic during office hours if you have any questions or concerns.   You may also contact the Patient Navigator at [phone] should you have any questions or need assistance in obtaining follow up care.      Resources For Cancer Patients and their Caregivers ? American Cancer Society: Can assist with transportation, wigs, general needs, runs Look Good Feel Better.        (912)540-2617 ? Cancer Care: Provides financial assistance, online support groups, medication/co-pay assistance.  1-800-813-HOPE (810)539-4080) ? [place] Assists [place] cancer patients and their families through emotional , educational and  financial support.  (650)458-5449 ? [place] DSS Where to apply for food stamps, Medicaid and utility assistance. 313-783-1185 ? RCATS: Transportation to medical appointments. 586-086-5189 ? Social Security Administration: May apply for disability if have a Stage IV cancer. 386-452-3852 (610)345-1822 ? [employer], Disability and Transit Services: Assists with nutrition, care and transit needs. [phone]

## 2016-05-16 LAB — URINE CULTURE

## 2016-05-20 ENCOUNTER — Telehealth (HOSPITAL_COMMUNITY): Payer: Self-pay

## 2016-05-20 NOTE — Telephone Encounter (Signed)
Called and checked on patient. She states she is feeling better and is taking her potassium like she is supposed to. She takes 15 mL twice a day and says she is eating bananas in between also. Encouraged her to continue taking medication as directed and call cancer center if she had any problems or questions. She verbalized understanding.

## 2016-05-24 ENCOUNTER — Ambulatory Visit: Payer: Medicaid - Out of State | Attending: Gynecologic Oncology | Admitting: Gynecologic Oncology

## 2016-05-24 ENCOUNTER — Encounter (HOSPITAL_COMMUNITY)
Admission: RE | Admit: 2016-05-24 | Discharge: 2016-05-24 | Disposition: A | Discharge status: Home / Self Care | Payer: Medicaid - Out of State | Source: Ambulatory Visit | Attending: Hematology & Oncology | Admitting: Hematology & Oncology

## 2016-05-24 ENCOUNTER — Encounter: Payer: Self-pay | Admitting: Gynecologic Oncology

## 2016-05-24 VITALS — BP 100/77 | HR 111 | Temp 97.8°F | Resp 16 | Ht 60.0 in | Wt <= 1120 oz

## 2016-05-24 DIAGNOSIS — D259 Leiomyoma of uterus, unspecified: Secondary | ICD-10-CM | POA: Insufficient documentation

## 2016-05-24 DIAGNOSIS — F419 Anxiety disorder, unspecified: Secondary | ICD-10-CM | POA: Diagnosis not present

## 2016-05-24 DIAGNOSIS — D649 Anemia, unspecified: Secondary | ICD-10-CM | POA: Insufficient documentation

## 2016-05-24 DIAGNOSIS — G629 Polyneuropathy, unspecified: Secondary | ICD-10-CM | POA: Diagnosis not present

## 2016-05-24 DIAGNOSIS — E43 Unspecified severe protein-calorie malnutrition: Secondary | ICD-10-CM | POA: Diagnosis not present

## 2016-05-24 DIAGNOSIS — C787 Secondary malignant neoplasm of liver and intrahepatic bile duct: Secondary | ICD-10-CM | POA: Insufficient documentation

## 2016-05-24 DIAGNOSIS — E46 Unspecified protein-calorie malnutrition: Secondary | ICD-10-CM | POA: Diagnosis not present

## 2016-05-24 DIAGNOSIS — R5383 Other fatigue: Secondary | ICD-10-CM

## 2016-05-24 DIAGNOSIS — C539 Malignant neoplasm of cervix uteri, unspecified: Secondary | ICD-10-CM | POA: Diagnosis not present

## 2016-05-24 DIAGNOSIS — M2550 Pain in unspecified joint: Secondary | ICD-10-CM | POA: Diagnosis not present

## 2016-05-24 DIAGNOSIS — F329 Major depressive disorder, single episode, unspecified: Secondary | ICD-10-CM

## 2016-05-24 DIAGNOSIS — N879 Dysplasia of cervix uteri, unspecified: Secondary | ICD-10-CM | POA: Diagnosis not present

## 2016-05-24 DIAGNOSIS — R630 Anorexia: Secondary | ICD-10-CM

## 2016-05-24 DIAGNOSIS — Z87891 Personal history of nicotine dependence: Secondary | ICD-10-CM | POA: Diagnosis not present

## 2016-05-24 LAB — GLUCOSE, CAPILLARY: Glucose-Capillary: 110 mg/dL — ABNORMAL HIGH (ref 65–99)

## 2016-05-24 MED ORDER — FLUDEOXYGLUCOSE F - 18 (FDG) INJECTION
4.9300 | Freq: Once | INTRAVENOUS | Status: AC | PRN
  Administered 2016-05-24: 4.93 via INTRAVENOUS

## 2016-05-24 NOTE — Progress Notes (Signed)
Followup Gyn Onc Note Consult was initially requested by Dr. Evie Lacks for the evaluation of Erika [name] 41 y.o. female with vaginal bleeding and a cervical mass in January, 2016.  CC:  Chief Complaint  Patient presents with  . Cervical Cancer    Assessment/Plan:  Erika [name]  is a 41 y.o.  year old with recurrent cervical cancer with recurrent PET avid disease in the liver, porta hepatis and inframammary nodes.  She has had an excellent (possibly complete) clinical response to second line salvage therapy with carboplatin and paclitaxel and bevacizumab x 6 cycles completed on 05/14/16. At patient's request, we will stop chemotherapy at this time. She has had a very good response. It is unclear the viability of the remaining small focus on PET/CT. She is not tolerating adjuvant therapy well and has a very poor performance status and QOL.  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 Counseled regarding strategies to optimize nutrition goals.   Repeat PET/CT in 3 months and return to clinic in 3 months to see me. I discussed that there is a high chance for progression, and if so, we will reconsider options for therapy at that time.   HPI: Erika [name] is a 41 year old G2 P2 who was seen in consultation at the request of Dr. Evie Lacks for high-grade cervical dysplasia and a cervical mass. She was seen in the emergency room on 01/30/2014 for vaginal hemorrhage. A transvaginal ultrasound was performed  and revealed the uterus with fibroids measuring in total 11.6 x 9 x 9 cm, and a solid mass noted in the region of the cervix measuring 5.7 x 3.8 x 4 cm. The patient received a blood transfusion for her symptomatic anemia. A biopsy was taken of the cervical mass. Biopsy revealed at least high-grade dysplasia, however the cause of the tangential cart on the biopsy frank invasion could not be either ruled in or out.  Physical examination confirmed parametrial involvement on  the right, resulting in a stage IIB diagnosis.  Pretreatment PET/CT on 02/25/14 revealed positive pelvic lymph nodes bilaterally.  Posttreatment PET/CT on 07/12/14 showed reduction in size and metabolic activity of cervix to 3.2x2.6cm and no hypermetabolic activity in lymph nodes. Confirmed complete response to therapy.  She receives primary chemoradiation with cisplatin weekly, and I'll pelvic radiation therapy and intracavitary brachytherapy administered at [facility] with Dr. Sondra Come completed in late April, 2016.    Interval History:   In October, 2017 she was diagnosed with recurrence in the liver with multiple liver lesions on CT. PET/CT on 12/08/16 showing new small hypermetabolic right internal mammary soft tissue density, suspicious for metastatic lymphadenopathy. Increased size and number of hypermetabolic liver metastases. New mild hypermetabolic activity in porta hepatis is suspicious for lymph node metastases. No residual hypermetabolic cervical mass identified. Multiple uterine fibroids again seen without FDG uptake. New diffuse rectal wall thickening with hypermetabolic activity, suspicious for proctitis.  Initial US guided needle biopsy on 11/26/15 showed necrosis highly suspicious for metastatic SCC.  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   She went on to receive 6 cycles of carboplatin and paclitaxel with bevacizumab (cycle 6 on 05/14/16) with treatment delays and poor tolerance related to severe cachexia and malnutrition, dehydration, boney pains, neuropathy. She has lost substantial weight (now 66lbs). 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  PET/CT on 05/24/16 showed: Continued reduction in size and metabolism of solitary mildly hypermetabolic right  liver dome metastasis. No new or progressive hypermetabolic metastatic disease. There is a mildly  hypermetabolic 0.7 x 0.7 cm right liver dome mass with max SUV 3.1 (series 4/image 90), previously 1.5 x 1.2 cm on 04/08/2016 with max SUV 3.3, decreased in size and slightly decreased in metabolism.  Current Meds:  Outpatient Encounter Prescriptions as of 05/24/2016  Medication Sig  . BEVACIZUMAB IV Inject into the vein.  . calcium-vitamin D (OSCAL 500/200 D-3) 500-200 MG-UNIT per tablet Take 1 tablet by mouth 2 (two) times daily.  Marland Kitchen CARBOPLATIN IV Inject into the vein.  Marland Kitchen dexamethasone (DECADRON) 4 MG tablet Take 2 tablets (8 mg total) by mouth daily. Start the day after chemotherapy for 2 days.  Marland Kitchen estrogen, conjugated,-medroxyprogesterone (PREMPRO) 0.3-1.5 MG per tablet Take 1 tablet by mouth daily.  . ferrous sulfate 325 (65 FE) MG tablet Take 325 mg by mouth daily with breakfast.  . HYDROcodone-acetaminophen (NORCO) 7.5-325 MG tablet Take 1 tablet by mouth every 4 (four) hours as needed for moderate pain.  Marland Kitchen lidocaine-prilocaine (EMLA) cream Apply to affected area once  . LORazepam (ATIVAN) 1 MG tablet TAKE 1 TABLET BY MOUTH AT BEDTIME AS NEEDED FOR ANXIETY /INSOMNIA  . naproxen sodium (ANAPROX) 220 MG tablet Take 220 mg by mouth 4 (four) times daily as needed.  . ondansetron (ZOFRAN) 8 MG tablet Take 1 tablet (8 mg total) by mouth 2 (two) times daily as needed for refractory nausea / vomiting. Start on day 3 after chemo.  Marland Kitchen PACLITAXEL IV Inject into the vein.  Marland Kitchen Potassium Chloride 40 MEQ/15ML (20%) SOLN Take 40 mEq by mouth 2 (two) times daily.  . prochlorperazine (COMPAZINE) 10 MG tablet Take 1 tablet (10 mg total) by mouth every 6 (six) hours as needed (Nausea or vomiting).   No facility-administered encounter medications on file as of 05/24/2016.     Allergy: No Known Allergies  Social Hx:   Social History   Social History  . Marital status: Single    Spouse name: N/A  . Number of children: 2  . Years of education: N/A   Occupational History  . cook at [employer]  History Main Topics  . Smoking status: Former Smoker    Packs/day: 0.50    Years: 6.00    Types: Cigarettes    Quit date: 08/02/2011  . Smokeless tobacco: Never Used  . Alcohol use No  . Drug use: Yes    Types: Marijuana  . Sexual activity: Yes   Other Topics Concern  . Not on file   Social History Narrative  . No narrative on file    Past Surgical Hx:  Past Surgical History:  Procedure Laterality Date  . CESAREAN SECTION    . port a cath placement      January 2016 / right   . TANDEM RING INSERTION N/A 04/09/2014   Procedure: TANDEM RING PLACEMENT FOR HIGH DOSE RATE RADIATION THERAPY EXAM UNDER ANETHESIA PLACEMENT OF CERVICAL SLEEVE;  Surgeon: Gery Pray, MD;  Location: [facility];  Service: Urology;  Laterality: N/A;  . TANDEM RING INSERTION N/A 04/23/2014   Procedure: TANDEM RING PLACEMENT ;  Surgeon: Gery Pray, MD;  Location: [facility];  Service: Urology;  Laterality: N/A;  . TANDEM RING INSERTION N/A 04/30/2014   Procedure: TANDEM RING PLACEMENT,EXAM UNDER ANESTHESIA ;  Surgeon: Gery Pray, MD;  Location: [facility];  Service: Urology;  Laterality: N/A;  . TANDEM RING INSERTION N/A 05/07/2014   Procedure: TANDEM RING PLACEMENT;  Surgeon: Gery Pray, MD;  Location: [facility];  Service: Urology;  Laterality: N/A;  . TANDEM RING INSERTION N/A 05/17/2014   Procedure: TANDEM RING INSERTION;  Surgeon: Gery Pray, MD;  Location: [facility];  Service: Urology;  Laterality: N/A;  . TUBAL LIGATION      Past Medical Hx:  Past Medical History:  Diagnosis Date  . Anxiety   . Cervical cancer ([place])   . Cervical cancer, FIGO stage IIB ([place]) 04/09/2014  . Family history of adverse reaction to anesthesia    pt states her sister has to have the "reversal" after surgery   . Hypokalemia   . Microcytic hypochromic anemia   . Recurrent cervical cancer ([place]) 12/12/2015    Past Gynecological History:   G2 P2 with 1 SV D and one cesarean section. She's had a tubal ligation at the time  of cesarean section. Last Pap smear was in 2002. She is no history of abnormal cytology. No LMP recorded. Patient is not currently having periods (Reason: Chemotherapy).  Family Hx:  Family History  Problem Relation Age of Onset  . Asthma Mother   . Heart attack Father     Review of Systems:  Constitutional  Feels fatigued    ENT Normal appearing ears and nares bilaterally Skin/Breast  No rash, sores, jaundice, itching, dryness Cardiovascular  No chest pain, shortness of breath, or edema  Pulmonary  No cough or wheeze.  Gastro Intestinal  No nausea, vomitting, or diarrhoea. No bright red blood per rectum, no abdominal pain, change in bowel movement, or constipation. + poor appetite. Genito Urinary  No frequency, urgency, dysuria, see HPI Musculo Skeletal  + arthralgia Neurologic  + neuropathy feet Psychology  + depression  Vitals:  Blood pressure 100/77, pulse (!) 111, temperature 97.8 F (36.6 C), height 5' (1.524 m).  Physical Exam: WD in NAD Neck  Supple NROM, without any enlargements.  Lymph Node Survey No cervical supraclavicular or inguinal adenopathy Cardiovascular  Pulse normal rate, regularity and rhythm. S1 and S2 normal.  Lungs  Clear to auscultation bilateraly, without wheezes/crackles/rhonchi. Good air movement.  Skin  No rash/lesions/breakdown  Psychiatry  Alert and oriented to person, place, and time  Abdomen  Normoactive bowel sounds, abdomen soft, non-tender and thin without evidence of hernia.  Back No CVA tenderness Genito Urinary  Vulva/vagina: Normal external female genitalia.  No lesions. No discharge or bleeding.  Bladder/urethra:  No lesions or masses, well supported bladder  Vagina: no lesions, radiation changes present  Cervix:  No residual tumor palpable or visible. Slightly raised cervix on left side. It is smooth and regular.  Uterus:  Moderate size, mobile, no parametrial involvement or nodularity.  Adnexa: no palpable  masses. Rectal  Good tone, no masses, no parametrial induration. Extremities  No bilateral cyanosis, clubbing or edema.   Donaciano Eva, MD    CC: Dr Evie Lacks 05/24/2016, 12:27 PM

## 2016-05-24 NOTE — Patient Instructions (Signed)
Your scans showed good response to the chemotherapy therefore we are stopping chemotherapy for now. We are scheduling you for a repeat PET/CT in 3 months time and a follow-up appointment with Dr Denman George.  Continue to optimize your intake of proteins and calorie rich foods.

## 2016-05-25 ENCOUNTER — Other Ambulatory Visit (HOSPITAL_COMMUNITY): Payer: Self-pay | Admitting: Oncology

## 2016-06-07 ENCOUNTER — Telehealth (HOSPITAL_COMMUNITY): Payer: Self-pay | Admitting: Emergency Medicine

## 2016-06-07 ENCOUNTER — Other Ambulatory Visit (HOSPITAL_COMMUNITY): Payer: Self-pay | Admitting: Oncology

## 2016-06-07 NOTE — Telephone Encounter (Signed)
Erika [name] had called from [facility] in [place] about pts hydrocodone prescription.  Pt had gotten her 3/20 filled, then April prescription filled, then presented with a duplicated prescription of hydrocodone that matched the 3/20 prescription.  Spoke with Erika Crigler PA and Erika [name] has a doctors appt tomorrow.  The pharmacy can cancel that prescription and we will write he a new one tomorrow, or if she is out of medication they can give her an emergency 3 day supply and we will given her a new prescription when she comes in tomorrow.

## 2016-06-08 ENCOUNTER — Encounter (HOSPITAL_COMMUNITY): Payer: Self-pay | Admitting: Adult Health

## 2016-06-08 ENCOUNTER — Encounter: Payer: Self-pay | Admitting: *Deleted

## 2016-06-08 ENCOUNTER — Encounter (HOSPITAL_COMMUNITY): Payer: PRIVATE HEALTH INSURANCE | Attending: Oncology | Admitting: Adult Health

## 2016-06-08 VITALS — BP 100/63 | HR 122 | Temp 98.3°F | Resp 16 | Ht 60.0 in | Wt <= 1120 oz

## 2016-06-08 DIAGNOSIS — K219 Gastro-esophageal reflux disease without esophagitis: Secondary | ICD-10-CM | POA: Diagnosis not present

## 2016-06-08 DIAGNOSIS — C539 Malignant neoplasm of cervix uteri, unspecified: Secondary | ICD-10-CM

## 2016-06-08 DIAGNOSIS — G893 Neoplasm related pain (acute) (chronic): Secondary | ICD-10-CM | POA: Diagnosis not present

## 2016-06-08 DIAGNOSIS — E876 Hypokalemia: Secondary | ICD-10-CM | POA: Insufficient documentation

## 2016-06-08 DIAGNOSIS — E43 Unspecified severe protein-calorie malnutrition: Secondary | ICD-10-CM | POA: Diagnosis not present

## 2016-06-08 DIAGNOSIS — C787 Secondary malignant neoplasm of liver and intrahepatic bile duct: Secondary | ICD-10-CM | POA: Diagnosis not present

## 2016-06-08 DIAGNOSIS — F419 Anxiety disorder, unspecified: Secondary | ICD-10-CM | POA: Insufficient documentation

## 2016-06-08 DIAGNOSIS — K5903 Drug induced constipation: Secondary | ICD-10-CM | POA: Insufficient documentation

## 2016-06-08 DIAGNOSIS — Z87891 Personal history of nicotine dependence: Secondary | ICD-10-CM | POA: Insufficient documentation

## 2016-06-08 MED ORDER — PREDNISONE 20 MG PO TABS: 20.0000 mg | ORAL_TABLET | Freq: Every day | ORAL | 0 refills | Status: AC

## 2016-06-08 MED ORDER — HYDROCODONE-ACETAMINOPHEN 7.5-325 MG PO TABS: 1.0000 | ORAL_TABLET | ORAL | 0 refills | Status: DC | PRN

## 2016-06-08 MED ORDER — PANTOPRAZOLE SODIUM 40 MG PO TBEC: 40.0000 mg | DELAYED_RELEASE_TABLET | Freq: Every day | ORAL | 6 refills | Status: AC

## 2016-06-08 NOTE — Patient Instructions (Addendum)
[place] at [facility] Discharge Instructions  RECOMMENDATIONS MADE BY THE CONSULTANT AND ANY TEST RESULTS WILL BE SENT TO YOUR REFERRING PHYSICIAN.  You were seen today by Mike Craze NP. Start taking Protonix in the morning 30 minutes before you eat. Start taking Prednisone 20mg  in the morning with food for 2 weeks. Return in 1 month for port flush and labs.    Thank you for choosing [place] at [facility] to provide your oncology and hematology care.  To afford each patient quality time with our provider, please arrive at least 15 minutes before your scheduled appointment time.    If you have a lab appointment with the [place] please come in thru the  Main Entrance and check in at the main information desk  You need to re-schedule your appointment should you arrive 10 or more minutes late.  We strive to give you quality time with our providers, and arriving late affects you and other patients whose appointments are after yours.  Also, if you no show three or more times for appointments you may be dismissed from the clinic at the providers discretion.     Again, thank you for choosing [facility].  Our hope is that these requests will decrease the amount of time that you wait before being seen by our physicians.       _____________________________________________________________  Should you have questions after your visit to [facility], please contact our office at [phone] between the hours of 8:30 a.m. and 4:30 p.m.  Voicemails left after 4:30 p.m. will not be returned until the following business day.  For prescription refill requests, have your pharmacy contact our office.       Resources For Cancer Patients and their Caregivers ? American Cancer Society: Can assist with transportation, wigs, general needs, runs Look Good Feel Better.        [phone] ? Cancer Care: Provides  financial assistance, online support groups, medication/co-pay assistance.  1-800-813-HOPE 307-290-5736) ? [place] Assists [place] cancer patients and their families through emotional , educational and financial support.  727-592-5624 ? [place] DSS Where to apply for food stamps, Medicaid and utility assistance. 445-097-6785 ? RCATS: Transportation to medical appointments. 570-577-7708 ? Social Security Administration: May apply for disability if have a Stage IV cancer. 505-232-9248 [phone] ? [employer], Disability and Transit Services: Assists with nutrition, care and transit needs. [place] Support Programs: @10RELATIVEDAYS @ > Cancer Support Group  2nd Tuesday of the month 1pm-2pm, Journey Room  > Creative Journey  3rd Tuesday of the month 1130am-1pm, Journey Room  > Look Good Feel Better  1st Wednesday of the month 10am-12 noon, Journey Room (Call [place] to register [phone])

## 2016-06-08 NOTE — Progress Notes (Signed)
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 Denman George      02/18/2014 Pathology Results    Cervix, biopsy - SQUAMOUS CELL CARCINOMA.      03/07/2014 - 04/24/2014 Radiation Therapy    Dr. Sondra Come- vaginal brachytherapy, 45 Gy to pelvis with sidewall boost 9 Gy and HDR 28.5 Gy in 5 fractions      03/08/2014 - 04/03/2014 Chemotherapy    Weekly CDDP 40 mg/m2 by Dr. 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      07/12/2014 PET scan    Marked interval decrease in size and hypermetabolism of the cervical mass.  The bilateral hypermetabolic pelvic sidewall lymphadenopathy has resolved in the interval.  Areas of hypermetabolic brown fat bilaterally in the chest and abdomen.      11/05/2015 Imaging    MRI abdomen at [facility]- 7.0 x 5.3 cm are in left hepatic lobe with central 3.1 x 2.7 x 2.3 cm complex cystic area, as well as an apparent subcapsular foci      11/26/2015 Procedure    US aspiration of left hepatic lobe- yielding 13 cc of yellow purulent fluid (pain resolved and PO intake increased)      11/26/2015 Pathology Results    [id number]- highly suspicious for squamous cell carcinoma      12/09/2015 PET scan    New small hypermetabolic right internal mammary soft tissue density, suspicious for metastatic lymphadenopathy.  Increased size and number of hypermetabolic liver metastases. New mild hypermetabolic activity in porta hepatis is suspicious for lymph node metastases.  No residual hypermetabolic cervical mass identified. Multiple uterine  fibroids again seen without FDG uptake.  New diffuse rectal wall thickening with hypermetabolic activity, suspicious for proctitis which could be infectious in etiology or secondary to radiation changes ; recommend clinical correlation. Small pelvic fluid collection between the anterior rectal wall and posterior uterine wall, possibly due to abscess.      12/12/2015 Initial Diagnosis    Recurrent cervical cancer ([place])     01/02/2016 Procedure    Port placed by Dr. Ladona Horns      01/09/2016 Procedure    US biopsy      01/12/2016 Pathology Results    Diagnosis Liver, needle/core biopsy - METASTATIC SQUAMOUS CELL CARCINOMA.      01/21/2016 -  Chemotherapy    The patient had palonosetron (ALOXI) injection 0.25 mg, 0.25 mg, Intravenous,  Once, 0 of 4 cycles  bevacizumab (AVASTIN) 625 mg in sodium chloride 0.9 % 100 mL chemo infusion, 15 mg/kg = 625 mg (100 % of original dose 15 mg/kg), Intravenous,  Once, 0 of 4 cycles Dose modification: 15 mg/kg (original dose 15 mg/kg, Cycle 1, Reason: Provider Judgment)  CARBOplatin (PARAPLATIN) in sodium chloride 0.9 % 100 mL chemo infusion,  (original dose ), Intravenous,  Once, 0 of 4 cycles Dose modification:   (Cycle 1)  PACLitaxel (TAXOL) 234 mg in dextrose 5 % 250 mL chemo infusion (> 80mg /m2), 175 mg/m2 = 234 mg, Intravenous,  Once, 0 of 4  cycles  for chemotherapy treatment.        04/08/2016 PET scan    Much improved PET-CT when compared to prior study from 2017. There are 2 slightly hypermetabolic residual hepatic lesions.  Diffuse marrow activity likely due to rebound or marrow stimulating drugs.      05/24/2016 PET scan    1. Continued reduction in size and metabolism of solitary mildly hypermetabolic right liver dome metastasis. No new or progressive hypermetabolic metastatic disease. 2. 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 GYN-ONC consultation advised for correlation with clinical  history and pelvic examination. 3. Stable diffuse marrow hypermetabolism compatible with reactive/stimulated marrow state.        INTERVAL HISTORY:  [name] 41 y.o. female returns for follow-up for metastatic cervical cancer with liver mets. Her last chemotherapy was 05/14/16 with Carbo/Taxol/Avastin.    She saw Dr. Denman George with Gyn-Onc on 05/24/16, who recommended no additional chemotherapy at this time given patient's poor tolerance to chemotherapy and associated weight loss.   Tressa tells me that she does not feel great today; she feels pretty tired. Endorses nausea & vomiting "all the time."  She takes a nausea pill (Zofran) every morning; reports that she also takes Norco tab every morning "before I can really get my day started."  Pain is no worse and largely stable; describes pain as generalized and bone pains. She is requesting a refill of the pain medication today; states the pharmacy gave her a hard time about getting previous paper prescription refilled.  Continues to have (L)-sided numbness/tingling to fingers and toes; symptoms are about the same as previous and are not worse.   She tells me she is eating, but struggles with certain smells of foods that make her nausea worse.  Her boyfriend states that sometimes after she eats, she has to run to the restroom for either nausea or to have immediate BM.  Does report feeling like she has some reflux-type symptoms at times.  Her sister thinks she may have a stomach ulcer.  Arpita denies abdominal pain with eating or after. Nausea and having no appetite are her biggest deterrent for eating right now. She does not actually vomit very often; bowels move okay.   Her weight is down to 64 lbs; down ~7 lbs in the past month.   REVIEW OF SYSTEMS:  Review of Systems  Constitutional: Positive for fatigue. Negative for chills and fever.  HENT:  Negative.  Negative for trouble swallowing.   Eyes: Negative.   Respiratory: Positive for shortness of  breath (chronic).   Cardiovascular: Negative.   Gastrointestinal: Positive for abdominal pain and nausea. Negative for blood in stool, diarrhea and vomiting.       Reflux symptoms   Endocrine: Negative.   Genitourinary: Positive for pelvic pain (managed with Norco and Aleve). Negative for dysuria, hematuria, menstrual problem and vaginal bleeding.   Musculoskeletal: Positive for arthralgias.  Skin: Negative.  Negative for rash.  Neurological: Positive for numbness. Negative for dizziness and headaches.  Psychiatric/Behavioral: Positive for depression ("I get down sometimes.").     PAST MEDICAL/SURGICAL HISTORY:  Past Medical History:  Diagnosis Date  . Anxiety   . Cervical cancer ([place])   . Cervical cancer, FIGO stage IIB ([place]) 04/09/2014  . Family history of adverse reaction to anesthesia    pt states her sister has to have the "reversal" after surgery   . Hypokalemia   . Microcytic hypochromic anemia   . Recurrent cervical cancer (  [place]) 12/12/2015   Past Surgical History:  Procedure Laterality Date  . CESAREAN SECTION    . port a cath placement      January 2016 / right   . TANDEM RING INSERTION N/A 04/09/2014   Procedure: TANDEM RING PLACEMENT FOR HIGH DOSE RATE RADIATION THERAPY EXAM UNDER ANETHESIA PLACEMENT OF CERVICAL SLEEVE;  Surgeon: Gery Pray, MD;  Location: [facility];  Service: Urology;  Laterality: N/A;  . TANDEM RING INSERTION N/A 04/23/2014   Procedure: TANDEM RING PLACEMENT ;  Surgeon: Gery Pray, MD;  Location: [facility];  Service: Urology;  Laterality: N/A;  . TANDEM RING INSERTION N/A 04/30/2014   Procedure: TANDEM RING PLACEMENT,EXAM UNDER ANESTHESIA ;  Surgeon: Gery Pray, MD;  Location: [facility];  Service: Urology;  Laterality: N/A;  . TANDEM RING INSERTION N/A 05/07/2014   Procedure: TANDEM RING PLACEMENT;  Surgeon: Gery Pray, MD;  Location: [facility];  Service: Urology;  Laterality: N/A;  . TANDEM RING INSERTION N/A 05/17/2014   Procedure: TANDEM RING INSERTION;   Surgeon: Gery Pray, MD;  Location: [facility];  Service: Urology;  Laterality: N/A;  . TUBAL LIGATION       SOCIAL HISTORY:  Social History   Social History  . Marital status: Single    Spouse name: N/A  . Number of children: 2  . Years of education: N/A   Occupational History  . cook at [employer] History Main Topics  . Smoking status: Former Smoker    Packs/day: 0.50    Years: 6.00    Types: Cigarettes    Quit date: 08/02/2011  . Smokeless tobacco: Never Used  . Alcohol use No  . Drug use: Yes    Types: Marijuana  . Sexual activity: Yes   Other Topics Concern  . Not on file   Social History Narrative  . No narrative on file    FAMILY HISTORY:  Family History  Problem Relation Age of Onset  . Asthma Mother   . Heart attack Father     CURRENT MEDICATIONS:  Outpatient Encounter Prescriptions as of 06/08/2016  Medication Sig Note  . BEVACIZUMAB IV Inject into the vein. 01/02/2016: Med has not been started  . calcium-vitamin D (OSCAL 500/200 D-3) 500-200 MG-UNIT per tablet Take 1 tablet by mouth 2 (two) times daily.   Marland Kitchen CARBOPLATIN IV Inject into the vein. 01/02/2016: Med has not been started  . estrogen, conjugated,-medroxyprogesterone (PREMPRO) 0.3-1.5 MG per tablet Take 1 tablet by mouth daily.   . ferrous sulfate 325 (65 FE) MG tablet Take 325 mg by mouth daily with breakfast. 12/12/2015: Pt taking daily  . HYDROcodone-acetaminophen (NORCO) 7.5-325 MG tablet Take 1 tablet by mouth every 4 (four) hours as needed for moderate pain.   Marland Kitchen lidocaine-prilocaine (EMLA) cream Apply to affected area once 01/02/2016: Have not started  . LORazepam (ATIVAN) 1 MG tablet TAKE 1 TABLET BY MOUTH AT BEDTIME AS NEEDED FOR ANXIETY /INSOMNIA   . naproxen sodium (ANAPROX) 220 MG tablet Take 220 mg by mouth 4 (four) times daily as needed.   . ondansetron (ZOFRAN) 8 MG tablet Take 1 tablet (8 mg total) by mouth 2 (two) times daily as needed for refractory nausea /  vomiting. Start on day 3 after chemo.   Marland Kitchen PACLITAXEL IV Inject into the vein. 01/02/2016: Med has not been started  . Potassium Chloride 40 MEQ/15ML (20%) SOLN Take 40 mEq by mouth 2 (two) times daily.   . prochlorperazine (COMPAZINE) 10 MG  tablet Take 1 tablet (10 mg total) by mouth every 6 (six) hours as needed (Nausea or vomiting).   . [DISCONTINUED] dexamethasone (DECADRON) 4 MG tablet Take 2 tablets (8 mg total) by mouth daily. Start the day after chemotherapy for 2 days. 01/02/2016: Have not started  . [DISCONTINUED] HYDROcodone-acetaminophen (NORCO) 7.5-325 MG tablet Take 1 tablet by mouth every 4 (four) hours as needed for moderate pain.   . pantoprazole (PROTONIX) 40 MG tablet Take 1 tablet (40 mg total) by mouth daily.   . predniSONE (DELTASONE) 20 MG tablet Take 1 tablet (20 mg total) by mouth daily with breakfast.    No facility-administered encounter medications on file as of 06/08/2016.     ALLERGIES:  No Known Allergies   PHYSICAL EXAM:  ECOG Performance status: 1 - Symptomatic; largely independent.   Vitals:   06/08/16 1009  BP: 100/63  Pulse: (!) 122  Resp: 16  Temp: 98.3 F (36.8 C)   Filed Weights   06/08/16 1009  Weight: 64 lb (29 kg)    Physical Exam  Constitutional: She is oriented to person, place, and time.  Severely emaciated female in no acute distress   HENT:  Head: Normocephalic.  Mouth/Throat: Oropharynx is clear and moist. No oropharyngeal exudate.  Eyes: Conjunctivae are normal. Pupils are equal, round, and reactive to light. No scleral icterus.  Neck: Normal range of motion. Neck supple.  Cardiovascular: Regular rhythm.   Tachycardia   Pulmonary/Chest: Effort normal and breath sounds normal. No respiratory distress. She has no wheezes.  Abdominal: Soft. She exhibits no distension. There is no tenderness. There is no rebound and no guarding.  Hypoactive bowel sounds   Musculoskeletal: Normal range of motion. She exhibits no edema.    Lymphadenopathy:    She has no cervical adenopathy.  Neurological: She is alert and oriented to person, place, and time. No cranial nerve deficit. Gait normal.  Skin: Skin is warm and dry. No rash noted.  Psychiatric: Mood, memory, affect and judgment normal.  Nursing note and vitals reviewed.    LABORATORY DATA:  I have reviewed the labs as listed.  CBC    Component Value Date/Time   WBC 13.8 (H) 05/14/2016 1006   RBC 2.71 (L) 05/14/2016 1006   HGB 9.3 (L) 05/14/2016 1006   HGB 12.0 12/12/2015 0920   HCT 29.0 (L) 05/14/2016 1006   HCT 37.6 12/12/2015 0920   PLT 341 05/14/2016 1006   PLT 289 12/12/2015 0920   MCV 107.0 (H) 05/14/2016 1006   MCV 89.1 12/12/2015 0920   MCH 34.3 (H) 05/14/2016 1006   MCHC 32.1 05/14/2016 1006   RDW 17.2 (H) 05/14/2016 1006   RDW 15.5 (H) 12/12/2015 0920   LYMPHSABS 1.3 05/14/2016 1006   LYMPHSABS 1.0 12/12/2015 0920   MONOABS 1.7 (H) 05/14/2016 1006   MONOABS 0.3 12/12/2015 0920   EOSABS 0.0 05/14/2016 1006   EOSABS 0.0 12/12/2015 0920   BASOSABS 0.0 05/14/2016 1006   BASOSABS 0.0 12/12/2015 0920   CMP Latest Ref Rng & Units 05/14/2016 05/11/2016 04/20/2016  Glucose 65 - 99 mg/dL 89 89 94  BUN 6 - 20 mg/dL 9 8 13   Creatinine 0.44 - 1.00 mg/dL 0.53 0.43(L) 0.54  Sodium 135 - 145 mmol/L 134(L) 137 137  Potassium 3.5 - 5.1 mmol/L 2.9(L) 2.7(LL) 2.9(L)  Chloride 101 - 111 mmol/L 93(L) 95(L) 96(L)  CO2 22 - 32 mmol/L 32 34(H) 32  Calcium 8.9 - 10.3 mg/dL 8.9 8.9 9.0  Total Protein 6.5 -  8.1 g/dL 7.2 7.1 7.4  Total Bilirubin 0.3 - 1.2 mg/dL 0.4 0.5 0.4  Alkaline Phos 38 - 126 U/L 106 107 98  AST 15 - 41 U/L 15 14(L) 12(L)  ALT 14 - 54 U/L 10(L) 9(L) 7(L)     PENDING LABS:  CA-125 pending    DIAGNOSTIC IMAGING:  *The following radiologic images and reports have been reviewed independently and agree with below findings.  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 Most recent restaging PET scan 05/24/16 revealed continued reduction in size and metabolism of right liver mets; no new/progressive metastatic disease. 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  Patient saw Dr. Denman George with Gyn-Onc on 05/24/16; she recommends no additional chemotherapy at this time given patient's poor tolerance and weight loss.  -CA-125 pending; serial tumor markers have been largely stable.  -Follow-up with Dr. Denman George in 08/2016 with subsequent restaging PET scan.  -Return to cancer center in 1 month to monitor weight loss.   Extreme malnutrition:  -Weight loss has been significant and very concerning.  Stressed how critical it is that she not lose any more weight.  Last restaging PET scan on 05/24/16 reassuring that disease burden should not be contributing to weight loss.   -Discussed that her body is in "starvation mode" and is likely the cause of her nausea and immediate urge to defecate with oral intake.  This does not happening at every meal.  She knows that the biggest threat to her health and life right now is not the cancer, but her extreme malnutrition.  -Will start her on low-dose daily Prednisone 20 mg x 2 weeks to try to boost her appetite.  Recommended continued use of Zofran.  -Will also start her on Protonix 40 mg daily, both in the setting of reflux symptoms and steroid use.   -I set a goal for her to be >/= 70 lbs in 1 month when she returns for follow-up.  She assures me that she will work hard on trying to reach this goal.  -I will send a copy of this office visit note to Jennet Maduro, RD to see if she can help make arrangements to see this patient at her next follow-up visit.    Neoplasm related pain:  -Does not take MS Contin for fear of side effects. Pain currently managed with Norco alone. She  takes 0.5-1.5 pills at a time based on her pain level.  Uses about 3-4 Norco tabs/day.  She feels like the dosage is adequate. She is concerned about addiction. We discussed the differences between opiate tolerance and addiction.  -[place] Controlled Substance Registry reviewed. Her last Norco prescription was filled on 05/27/16; this would have been a 10-day supply, therefore refill is appropriate at this time.  Paper prescription for Norco 7.5/325 Q4Hprn, #100, no refills given to patient; notation on prescription "early refill authorization, may fill 06/08/16 or after."   -Reports that she has not had issues with her insurance covering the [facility] prescriptions since last visit.  She does have periodic issues with pharmacy filling the prescriptions, based on timing of prescriptions.          Dispo:  -Return to cancer center in 1 month for continued follow-up.  All questions were answered to patient's stated satisfaction. Encouraged patient to call with any new concerns or questions before her next visit to the cancer center and we can certain see her sooner, if needed.    Plan of care discussed with Dr. Talbert Cage, who agrees with the above aforementioned.     Orders placed this encounter:  No orders of the defined types were placed in this encounter.     Mike Craze, NP [place] [phone]

## 2016-06-08 NOTE — Progress Notes (Signed)
[place] Clinical Social Work  Clinical Social Work was referred by medical oncology NP for re-assessment of psychosocial needs due to ongoing cancer concerns. Clinical Social Worker met with patient and her boyfriend briefly at [facility] to offer support and re-assess for needs. CSW has worked with pt before, first time meeting boyfriend. CSW introduced self, and briefly explained role of CSW. CSW provided handouts with both CSW contact numbers and May calendar/resources to assist.   Pt denies needs at home and reports someone is always with her. CSW reviewed options for CNA help at home through [facility] and she will consider. CSW has offered various services/resources in the past and pt has declined. She states "I am getting better". From chart review, it looks like she can get one more case of Ensure and is here on a day when [place], RD is here. CSW will continue to follow and assist.     Clinical Social Work interventions: Resource education   Loren Racer, [place] [facility] Tuesdays   [phone]

## 2016-06-11 ENCOUNTER — Encounter (HOSPITAL_COMMUNITY): Payer: Self-pay

## 2016-06-11 NOTE — Progress Notes (Signed)
Nutrition Follow-up:   ASSESSMENT:  Nutrition follow-up completed via phone this am.   Patient reports "I am craving food and wanting food now."  Before I just was not feeling good, having nausea, depression but I am feeling better now."  Per patient yesterday she consumed cornbeef hash and eggs for breakfast, 2 ribs and whole twice baked potato for lunch. For dinner last night ate pizza pasta dish (??1/2 cup) and had bowl of ice cream for snack (~1350 calories and 70 g of protein utilizing online nutrition facts). Reports she drinks ensure plus when there is no one available to fix her a meal.   Patient reports that she has started taking prednisone and thinks it may be helping.  Reports has been taking zofran when she gets up and no issues with nausea.  Still has abdominal pain but not too severe.  Reports had bowel movement this am.  Reports that she started protonix.   No other nutrition related symptoms reported at this time.  At the end of phone call, patient's phone cut out, tried to call back but busy, then waited later and called back and phone just rang and rang.  No voicemail available.   Medications: prednisone, protonix added  Labs: reviewed  Anthropometrics:   Weight taken on 5/8 and 64 lb, decreased from 76 lb on 3/23.   NUTRITION DIAGNOSIS: Malnutrition continues   MALNUTRITION DIAGNOSIS: Severe malnutrition continues   INTERVENTION:   Discussed high calorie, high protein foods.  Patient able to verbalized some of these foods. Encouraged use of ensure plus for additional 350 calories and 14 g of protein.  Offered to order patient last complimentary case of ensure but she said she would call me back if she wanted me to order it.   Encouraged patient to utilize medications to manage side effects and allow her to eat.   If patient unable to increase weight and nutrition and aggressive nutrition therapy is wanted, recommend consideration of feeding tube placement to  maximize nutrition. Discussed recommendations with Elzie Rings, NP.   MONITORING, EVALUATION, GOAL: Patient will consume adequate calories and protein to promote weight gain   NEXT VISIT: Phone call on June 1, RD will not be in clinic on next appt date.   Aadil Sur B. Zenia Resides, [place], [place] Registered Dietitian [phone] (pager)

## 2016-06-18 ENCOUNTER — Encounter (HOSPITAL_COMMUNITY): Payer: Self-pay

## 2016-06-18 ENCOUNTER — Encounter (HOSPITAL_BASED_OUTPATIENT_CLINIC_OR_DEPARTMENT_OTHER): Payer: PRIVATE HEALTH INSURANCE | Admitting: Adult Health

## 2016-06-18 ENCOUNTER — Encounter (HOSPITAL_BASED_OUTPATIENT_CLINIC_OR_DEPARTMENT_OTHER): Payer: PRIVATE HEALTH INSURANCE

## 2016-06-18 ENCOUNTER — Telehealth (HOSPITAL_COMMUNITY): Payer: Self-pay | Admitting: *Deleted

## 2016-06-18 ENCOUNTER — Other Ambulatory Visit (HOSPITAL_COMMUNITY): Payer: Self-pay | Admitting: Adult Health

## 2016-06-18 ENCOUNTER — Other Ambulatory Visit (HOSPITAL_COMMUNITY): Payer: PRIVATE HEALTH INSURANCE

## 2016-06-18 DIAGNOSIS — Z87891 Personal history of nicotine dependence: Secondary | ICD-10-CM | POA: Diagnosis not present

## 2016-06-18 DIAGNOSIS — K5903 Drug induced constipation: Secondary | ICD-10-CM | POA: Diagnosis not present

## 2016-06-18 DIAGNOSIS — C539 Malignant neoplasm of cervix uteri, unspecified: Secondary | ICD-10-CM

## 2016-06-18 DIAGNOSIS — C787 Secondary malignant neoplasm of liver and intrahepatic bile duct: Secondary | ICD-10-CM

## 2016-06-18 DIAGNOSIS — N898 Other specified noninflammatory disorders of vagina: Secondary | ICD-10-CM | POA: Diagnosis not present

## 2016-06-18 DIAGNOSIS — E43 Unspecified severe protein-calorie malnutrition: Secondary | ICD-10-CM

## 2016-06-18 DIAGNOSIS — E876 Hypokalemia: Secondary | ICD-10-CM

## 2016-06-18 DIAGNOSIS — G893 Neoplasm related pain (acute) (chronic): Secondary | ICD-10-CM | POA: Diagnosis not present

## 2016-06-18 DIAGNOSIS — R634 Abnormal weight loss: Secondary | ICD-10-CM

## 2016-06-18 DIAGNOSIS — F419 Anxiety disorder, unspecified: Secondary | ICD-10-CM | POA: Diagnosis not present

## 2016-06-18 LAB — CBC WITH DIFFERENTIAL/PLATELET
BASOS PCT: 0 %
Basophils Absolute: 0 10*3/uL (ref 0.0–0.1)
Eosinophils Absolute: 0 10*3/uL (ref 0.0–0.7)
Eosinophils Relative: 0 %
HEMATOCRIT: 27 % — AB (ref 36.0–46.0)
Hemoglobin: 8.8 g/dL — ABNORMAL LOW (ref 12.0–15.0)
LYMPHS ABS: 1.2 10*3/uL (ref 0.7–4.0)
LYMPHS PCT: 13 %
MCH: 34.5 pg — ABNORMAL HIGH (ref 26.0–34.0)
MCHC: 32.6 g/dL (ref 30.0–36.0)
MCV: 105.9 fL — AB (ref 78.0–100.0)
MONO ABS: 1.3 10*3/uL — AB (ref 0.1–1.0)
MONOS PCT: 13 %
NEUTROS ABS: 7 10*3/uL (ref 1.7–7.7)
Neutrophils Relative %: 74 %
Platelets: 390 10*3/uL (ref 150–400)
RBC: 2.55 MIL/uL — ABNORMAL LOW (ref 3.87–5.11)
RDW: 19.3 % — AB (ref 11.5–15.5)
WBC: 9.5 10*3/uL (ref 4.0–10.5)

## 2016-06-18 LAB — COMPREHENSIVE METABOLIC PANEL
ALBUMIN: 2.3 g/dL — AB (ref 3.5–5.0)
ALT: 8 U/L — ABNORMAL LOW (ref 14–54)
ANION GAP: 9 (ref 5–15)
AST: 17 U/L (ref 15–41)
Alkaline Phosphatase: 129 U/L — ABNORMAL HIGH (ref 38–126)
BILIRUBIN TOTAL: 0.3 mg/dL (ref 0.3–1.2)
BUN: 5 mg/dL — AB (ref 6–20)
CO2: 28 mmol/L (ref 22–32)
Calcium: 8.6 mg/dL — ABNORMAL LOW (ref 8.9–10.3)
Chloride: 99 mmol/L — ABNORMAL LOW (ref 101–111)
Creatinine, Ser: 0.44 mg/dL (ref 0.44–1.00)
GFR calc Af Amer: >60 mL/min (ref >60)
GFR calc non Af Amer: >60 mL/min (ref >60)
GLUCOSE: 101 mg/dL — AB (ref 65–99)
POTASSIUM: 3.6 mmol/L (ref 3.5–5.1)
Sodium: 136 mmol/L (ref 135–145)
TOTAL PROTEIN: 6.5 g/dL (ref 6.5–8.1)

## 2016-06-18 LAB — MAGNESIUM: Magnesium: 1.7 mg/dL (ref 1.7–2.4)

## 2016-06-18 LAB — PHOSPHORUS: PHOSPHORUS: 2.7 mg/dL (ref 2.5–4.6)

## 2016-06-18 MED ORDER — HEPARIN SOD (PORK) LOCK FLUSH 100 UNIT/ML IV SOLN
500.0000 [IU] | Freq: Once | INTRAVENOUS | Status: AC
  Administered 2016-06-18: 500 [IU] via INTRAVENOUS

## 2016-06-18 MED ORDER — SODIUM CHLORIDE 0.9 % IV SOLN
INTRAVENOUS | Status: AC
  Administered 2016-06-18: 11:00:00 via INTRAVENOUS

## 2016-06-18 NOTE — Progress Notes (Signed)
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 Denman George      02/18/2014 Pathology Results    Cervix, biopsy - SQUAMOUS CELL CARCINOMA.      03/07/2014 - 04/24/2014 Radiation Therapy    Dr. Sondra Come- vaginal brachytherapy, 45 Gy to pelvis with sidewall boost 9 Gy and HDR 28.5 Gy in 5 fractions      03/08/2014 - 04/03/2014 Chemotherapy    Weekly CDDP 40 mg/m2 by Dr. 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      07/12/2014 PET scan    Marked interval decrease in size and hypermetabolism of the cervical mass.  The bilateral hypermetabolic pelvic sidewall lymphadenopathy has resolved in the interval.  Areas of hypermetabolic brown fat bilaterally in the chest and abdomen.      11/05/2015 Imaging    MRI abdomen at [place]- 7.0 x 5.3 cm are in left hepatic lobe with central 3.1 x 2.7 x 2.3 cm complex cystic area, as well as an apparent subcapsular foci      11/26/2015 Procedure    US aspiration of left hepatic lobe- yielding 13 cc of yellow purulent fluid (pain resolved and PO intake increased)      11/26/2015 Pathology Results    [id number]- highly suspicious for squamous cell carcinoma      12/09/2015 PET scan    New small hypermetabolic right internal mammary soft tissue density, suspicious for metastatic lymphadenopathy.  Increased size and number of hypermetabolic liver metastases. New mild hypermetabolic activity in porta hepatis is suspicious for lymph node metastases.  No residual hypermetabolic cervical mass identified. Multiple uterine  fibroids again seen without FDG uptake.  New diffuse rectal wall thickening with hypermetabolic activity, suspicious for proctitis which could be infectious in etiology or secondary to radiation changes ; recommend clinical correlation. Small pelvic fluid collection between the anterior rectal wall and posterior uterine wall, possibly due to abscess.      12/12/2015 Initial Diagnosis    Recurrent cervical cancer ([place])     01/02/2016 Procedure    Port placed by Dr. Ladona Horns      01/09/2016 Procedure    US biopsy      01/12/2016 Pathology Results    Diagnosis Liver, needle/core biopsy - METASTATIC SQUAMOUS CELL CARCINOMA.      01/21/2016 -  Chemotherapy    The patient had palonosetron (ALOXI) injection 0.25 mg, 0.25 mg, Intravenous,  Once, 0 of 4 cycles  bevacizumab (AVASTIN) 625 mg in sodium chloride 0.9 % 100 mL chemo infusion, 15 mg/kg = 625 mg (100 % of original dose 15 mg/kg), Intravenous,  Once, 0 of 4 cycles Dose modification: 15 mg/kg (original dose 15 mg/kg, Cycle 1, Reason: Provider Judgment)  CARBOplatin (PARAPLATIN) in sodium chloride 0.9 % 100 mL chemo infusion,  (original dose ), Intravenous,  Once, 0 of 4 cycles Dose modification:   (Cycle 1)  PACLitaxel (TAXOL) 234 mg in dextrose 5 % 250 mL chemo infusion (> 80mg /m2), 175 mg/m2 = 234 mg, Intravenous,  Once, 0 of 4  cycles  for chemotherapy treatment.        04/08/2016 PET scan    Much improved PET-CT when compared to prior study from 2017. There are 2 slightly hypermetabolic residual hepatic lesions.  Diffuse marrow activity likely due to rebound or marrow stimulating drugs.      05/24/2016 PET scan    1. Continued reduction in size and metabolism of solitary mildly hypermetabolic right liver dome metastasis. No new or progressive hypermetabolic metastatic disease. 2. 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 GYN-ONC consultation advised for correlation with clinical  history and pelvic examination. 3. Stable diffuse marrow hypermetabolism compatible with reactive/stimulated marrow state.        INTERVAL HISTORY:  [name] 41 y.o. female returns for unscheduled visit reporting she has not been feeling well for the past several days.  She called our office requesting lab work.  I am seeing her today as an add-on.    She tells me she was taking the steroids, which I prescribed at our last visit but they were hurting her stomach.  She has really been trying to eat and drink more, but does tell me that she does not feel good.    She is concerned about recurrent dark brown, thick vaginal discharge that started this episode about 2-3 days ago.  No odor, pain, burning, dysuria, hematuria, vaginal itching or rash.   Her weight is down to 62 lbs today.     REVIEW OF SYSTEMS:  Review of Systems  Constitutional: Positive for appetite change, fatigue and unexpected weight change.  Gastrointestinal: Positive for abdominal pain.  Genitourinary: Positive for vaginal discharge. Negative for dysuria and hematuria.   Musculoskeletal: Positive for back pain.  Psychiatric/Behavioral: The patient is nervous/anxious.      PAST MEDICAL/SURGICAL HISTORY:  Past Medical History:  Diagnosis Date  . Anxiety   . Cervical cancer ([place])   . Cervical cancer, FIGO stage IIB ([place]) 04/09/2014  . Family history of adverse reaction to anesthesia    pt states her sister has to have the "reversal" after surgery   . Hypokalemia   . Microcytic hypochromic anemia   . Recurrent cervical cancer ([place]) 12/12/2015   Past Surgical History:  Procedure Laterality Date  . CESAREAN SECTION    . port a cath placement      January 2016 / right   . TANDEM RING INSERTION N/A 04/09/2014   Procedure: TANDEM RING PLACEMENT FOR HIGH DOSE RATE RADIATION THERAPY EXAM UNDER ANETHESIA PLACEMENT OF CERVICAL SLEEVE;  Surgeon: Gery Pray, MD;  Location: [facility];  Service: Urology;  Laterality:  N/A;  . TANDEM RING INSERTION N/A 04/23/2014   Procedure: TANDEM RING PLACEMENT ;  Surgeon: Gery Pray, MD;  Location: [facility];  Service: Urology;  Laterality: N/A;  . TANDEM RING INSERTION N/A 04/30/2014   Procedure: TANDEM RING PLACEMENT,EXAM UNDER ANESTHESIA ;  Surgeon: Gery Pray, MD;  Location: [facility];  Service: Urology;  Laterality: N/A;  . TANDEM RING INSERTION N/A 05/07/2014   Procedure: TANDEM RING PLACEMENT;  Surgeon: Gery Pray, MD;  Location: [facility];  Service: Urology;  Laterality: N/A;  . TANDEM RING INSERTION N/A 05/17/2014   Procedure: TANDEM RING INSERTION;  Surgeon: Gery Pray, MD;  Location: [facility];  Service: Urology;  Laterality: N/A;  . TUBAL LIGATION       SOCIAL HISTORY:  Social History   Social History  . Marital status: Single    Spouse name: N/A  .  Number of children: 2  . Years of education: N/A   Occupational History  . cook at [employer] History Main Topics  . Smoking status: Former Smoker    Packs/day: 0.50    Years: 6.00    Types: Cigarettes    Quit date: 08/02/2011  . Smokeless tobacco: Never Used  . Alcohol use No  . Drug use: Yes    Types: Marijuana  . Sexual activity: Yes   Other Topics Concern  . Not on file   Social History Narrative  . No narrative on file    FAMILY HISTORY:  Family History  Problem Relation Age of Onset  . Asthma Mother   . Heart attack Father     CURRENT MEDICATIONS:  Outpatient Encounter Prescriptions as of 06/18/2016  Medication Sig Note  . BEVACIZUMAB IV Inject into the vein. 01/02/2016: Med has not been started  . calcium-vitamin D (OSCAL 500/200 D-3) 500-200 MG-UNIT per tablet Take 1 tablet by mouth 2 (two) times daily.   Marland Kitchen CARBOPLATIN IV Inject into the vein. 01/02/2016: Med has not been started  . estrogen, conjugated,-medroxyprogesterone (PREMPRO) 0.3-1.5 MG per tablet Take 1 tablet by mouth daily.   . ferrous sulfate 325 (65 FE) MG tablet Take 325 mg by mouth daily with  breakfast. 12/12/2015: Pt taking daily  . HYDROcodone-acetaminophen (NORCO) 7.5-325 MG tablet Take 1 tablet by mouth every 4 (four) hours as needed for moderate pain.   Marland Kitchen lidocaine-prilocaine (EMLA) cream Apply to affected area once 01/02/2016: Have not started  . LORazepam (ATIVAN) 1 MG tablet TAKE 1 TABLET BY MOUTH AT BEDTIME AS NEEDED FOR ANXIETY /INSOMNIA   . naproxen sodium (ANAPROX) 220 MG tablet Take 220 mg by mouth 4 (four) times daily as needed.   . ondansetron (ZOFRAN) 8 MG tablet Take 1 tablet (8 mg total) by mouth 2 (two) times daily as needed for refractory nausea / vomiting. Start on day 3 after chemo.   Marland Kitchen PACLITAXEL IV Inject into the vein. 01/02/2016: Med has not been started  . pantoprazole (PROTONIX) 40 MG tablet Take 1 tablet (40 mg total) by mouth daily.   . Potassium Chloride 40 MEQ/15ML (20%) SOLN Take 40 mEq by mouth 2 (two) times daily.   . predniSONE (DELTASONE) 20 MG tablet Take 1 tablet (20 mg total) by mouth daily with breakfast.   . prochlorperazine (COMPAZINE) 10 MG tablet Take 1 tablet (10 mg total) by mouth every 6 (six) hours as needed (Nausea or vomiting).    Facility-Administered Encounter Medications as of 06/18/2016  Medication  . 0.9 %  sodium chloride infusion    ALLERGIES:  No Known Allergies   PHYSICAL EXAM:  ECOG Performance status: 2 - Symptomatic; requires occasional assistance.       Physical Exam  Constitutional: She is oriented to person, place, and time.  Severely emaciated female in no acute distress   HENT:  Head: Normocephalic.  Eyes: Conjunctivae are normal. No scleral icterus.  Pulmonary/Chest: Effort normal. No respiratory distress.  Musculoskeletal: Normal range of motion. She exhibits no edema.  Neurological: She is alert and oriented to person, place, and time.  Skin: Skin is warm and dry.  Psychiatric:  Tearful and anxious  Nursing note and vitals reviewed.    LABORATORY DATA:  I have reviewed the labs as listed.   CBC    Component Value Date/Time   WBC 9.5 06/18/2016 1103   RBC 2.55 (L) 06/18/2016 1103   HGB 8.8 (L) 06/18/2016 1103  HGB 12.0 12/12/2015 0920   HCT 27.0 (L) 06/18/2016 1103   HCT 37.6 12/12/2015 0920   PLT 390 06/18/2016 1103   PLT 289 12/12/2015 0920   MCV 105.9 (H) 06/18/2016 1103   MCV 89.1 12/12/2015 0920   MCH 34.5 (H) 06/18/2016 1103   MCHC 32.6 06/18/2016 1103   RDW 19.3 (H) 06/18/2016 1103   RDW 15.5 (H) 12/12/2015 0920   LYMPHSABS 1.2 06/18/2016 1103   LYMPHSABS 1.0 12/12/2015 0920   MONOABS 1.3 (H) 06/18/2016 1103   MONOABS 0.3 12/12/2015 0920   EOSABS 0.0 06/18/2016 1103   EOSABS 0.0 12/12/2015 0920   BASOSABS 0.0 06/18/2016 1103   BASOSABS 0.0 12/12/2015 0920   CMP Latest Ref Rng & Units 06/18/2016 05/14/2016 05/11/2016  Glucose 65 - 99 mg/dL 101(H) 89 89  BUN 6 - 20 mg/dL 5(L) 9 8  Creatinine 0.44 - 1.00 mg/dL 0.44 0.53 0.43(L)  Sodium 135 - 145 mmol/L 136 134(L) 137  Potassium 3.5 - 5.1 mmol/L 3.6 2.9(L) 2.7(LL)  Chloride 101 - 111 mmol/L 99(L) 93(L) 95(L)  CO2 22 - 32 mmol/L 28 32 34(H)  Calcium 8.9 - 10.3 mg/dL 8.6(L) 8.9 8.9  Total Protein 6.5 - 8.1 g/dL 6.5 7.2 7.1  Total Bilirubin 0.3 - 1.2 mg/dL 0.3 0.4 0.5  Alkaline Phos 38 - 126 U/L 129(H) 106 107  AST 15 - 41 U/L 17 15 14(L)  ALT 14 - 54 U/L 8(L) 10(L) 9(L)     PENDING LABS:     DIAGNOSTIC IMAGING:  *The following radiologic images and reports have been reviewed independently and agree with below findings.  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 Most recent restaging PET scan 05/24/16 revealed continued reduction in size and metabolism of right liver mets; no new/progressive metastatic disease. 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  Patient saw Dr.  Denman George with Gyn-Onc on 05/24/16; she recommends no additional chemotherapy at this time given patient's poor tolerance and weight loss.  -CA-125 pending; serial tumor markers have been largely stable.  -Follow-up with Dr. Denman George in 08/2016 with subsequent restaging PET scan.  -Return to cancer center in 1 month to monitor weight loss.   Severe protein calorie malnutrition:  -Weight loss persists despite trial of short-course steroids.  Weight today is 62 lbs.  I shared my very sincere concerns of organ failure and death from malnutrition.  All of our efforts to increase her weight post-chemotherapy have been difficult.  -I shared with her my recommendations for PEG tube placement to start tube feedings.  She became emotional, but was able to verbalize, "I knew you were going to tell me this, so I was anxious about seeing you today because I knew it wasn't getting better."   -She will be at very high risk for refeeding syndrome once feeding tube is placed.  I briefly discussed case with Jennet Maduro, RD today. She is aware of our plans for PEG placement and agrees with these recommendations.  We will both plan to see her in 1 week to initiate tube feedings.   -Orders placed to have PEG placed ASAP with IR-guidance in [place].   -Message sent to Dr. Denman George and Joylene John, NP to make them aware of  patient's condition as well.   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 Hemoglobin is stable; WBCs normal. No fever.   -OTC Monistat was successful for symptom relief in the past. It would be safe for her to try this again, if needed. Will continue to monitor.   Neoplasm related pain:  -Continue Norco as previously prescribed; no refill needed at this time.    Hypokalemia:  -She missed a few doses of her oral potassium.  K is normal today at 3.6, but recommended her to resume potassium supplementation as scheduled.       Dispo:  -Follow-up in 1  week.  -See nutrition in 1 week as well for tube feeding recommendations.        All questions were answered to patient's stated satisfaction. Encouraged patient to call with any new concerns or questions before her next visit to the cancer center and we can certain see her sooner, if needed.    Plan of care discussed with Dr. Talbert Cage, who agrees with the above aforementioned.     Orders placed this encounter:  Orders Placed This Encounter  Procedures  . IR GASTROSTOMY TUBE MOD SED      Mike Craze, NP [place] [phone]

## 2016-06-18 NOTE — Progress Notes (Signed)
Nutrition Follow-up:   ASSESSMENT:  Spoke with patient during IV fluids today.   Elzie Rings, NP has discussed feeding tube placement with patient today and patient has agreed.  Tube to be placed by [facility] soon.   Patient reports, I am just not eating enough.  I know I am loosing weight.  Medications: reviewed  Labs: K WNL, Mag and Phos WNL  Anthropometrics:   62 pounds decreased from 64 lb on 5/8   Estimated Energy Needs  Kcals: 010-932 calories/d (monitor with refeeding, maybe able to increase calories for weight gain) Protein: 28-33 g/d Fluid: > 843ml/d  NUTRITION DIAGNOSIS: Malnutrition continues   MALNUTRITION DIAGNOSIS: Severe malnutrition continues   INTERVENTION:   Showed patient sample tube and was able to answer questions regarding tube placement to patient satisfaction. Patient at high risk for refeeding syndrome so will need to monitor labs closely with tube feeding Recommend osmolite 1.2, likely 3 cans (1 can TID) goal rate.  Will provide 853 calories,  39g of protein, 583 ml water.   Will likely start at 1/2 can 2 times per day and monitor electrolytes closely. Will titrate pending tolerance and labs.      MONITORING, EVALUATION, GOAL: Patient will utilize PEG for nutrition to increase weight   NEXT VISIT: May 25th  Liliah Dorian B. Zenia Resides, [place], [place] Registered Dietitian [phone] (pager)

## 2016-06-18 NOTE — Progress Notes (Signed)
Tolerated hydration fluids well. Stable and ambulatory on discharge home with family.

## 2016-06-18 NOTE — Patient Instructions (Signed)
[facility] at [facility] Discharge Instructions  RECOMMENDATIONS MADE BY THE CONSULTANT AND ANY TEST RESULTS WILL BE SENT TO YOUR REFERRING PHYSICIAN.  Hydration fluids given today as ordered.  Thank you for choosing  Cancer Center at [facility] to provide your oncology and hematology care.  To afford each patient quality time with our provider, please arrive at least 15 minutes before your scheduled appointment time.    If you have a lab appointment with the Cancer Center please come in thru the  Main Entrance and check in at the main information desk  You need to re-schedule your appointment should you arrive 10 or more minutes late.  We strive to give you quality time with our providers, and arriving late affects you and other patients whose appointments are after yours.  Also, if you no show three or more times for appointments you may be dismissed from the clinic at the providers discretion.     Again, thank you for choosing [facility].  Our hope is that these requests will decrease the amount of time that you wait before being seen by our physicians.       _____________________________________________________________  Should you have questions after your visit to [facility], please contact our office at [phone] between the hours of 8:30 a.m. and 4:30 p.m.  Voicemails left after 4:30 p.m. will not be returned until the following business day.  For prescription refill requests, have your pharmacy contact our office.       Resources For Cancer Patients and their Caregivers ? American Cancer Society: Can assist with transportation, wigs, general needs, runs Look Good Feel Better.        1-888-227-6333 ? Cancer Care: Provides financial assistance, online support groups, medication/co-pay assistance.  1-800-813-HOPE (4673) ? [facility] Assists [place] cancer patients and their  families through emotional , educational and financial support.  336-427-4357 ? [place] DSS Where to apply for food stamps, Medicaid and utility assistance. 336-342-1394 ? [facility]: Transportation to medical appointments. 336-347-2287 ? Social Security Administration: May apply for disability if have a Stage IV cancer. 336-342-7796 1-800-772-1213 ? [place] Aging, Disability and Transit Services: Assists with nutrition, care and transit needs. 336-349-2343  Cancer Center Support Programs: @10RELATIVEDAYS@ > Cancer Support Group  2nd Tuesday of the month 1pm-2pm, Journey Room  > Creative Journey  3rd Tuesday of the month 1130am-1pm, Journey Room  > Look Good Feel Better  1st Wednesday of the month 10am-12 noon, Journey Room (Call American Cancer Society to register 1-800-395-5775)   

## 2016-06-18 NOTE — Telephone Encounter (Signed)
Patient called into the clinic stating that she went out of town last weekend and did not take her potassium with her.  She has been "trying to play catch up all week and still doesn't feel well".  Patient requesting to have her lab work checked today and corrected as needed.  Mike Craze, NP aware of patients complaints and stat lab orders placed and patient is aware to come on in to the clinic at this time to have those checked.

## 2016-06-21 ENCOUNTER — Other Ambulatory Visit: Payer: Self-pay | Admitting: Radiology

## 2016-06-21 ENCOUNTER — Ambulatory Visit (HOSPITAL_COMMUNITY): Admission: RE | Admit: 2016-06-21 | Payer: PRIVATE HEALTH INSURANCE | Source: Ambulatory Visit

## 2016-06-21 ENCOUNTER — Encounter (HOSPITAL_COMMUNITY): Payer: Self-pay

## 2016-06-21 ENCOUNTER — Other Ambulatory Visit (HOSPITAL_COMMUNITY): Payer: Self-pay | Admitting: Adult Health

## 2016-06-21 DIAGNOSIS — R634 Abnormal weight loss: Secondary | ICD-10-CM

## 2016-06-21 DIAGNOSIS — Z789 Other specified health status: Secondary | ICD-10-CM

## 2016-06-21 DIAGNOSIS — E639 Nutritional deficiency, unspecified: Secondary | ICD-10-CM

## 2016-06-21 DIAGNOSIS — E43 Unspecified severe protein-calorie malnutrition: Secondary | ICD-10-CM

## 2016-06-21 DIAGNOSIS — C539 Malignant neoplasm of cervix uteri, unspecified: Secondary | ICD-10-CM

## 2016-06-21 MED ORDER — OSMOLITE 1.2 CAL PO LIQD: ORAL | 0 refills | Status: DC

## 2016-06-21 NOTE — Progress Notes (Signed)
Nutrition Follow-up:  Patient to have PEG tube placed Tuesday, May 22nd by IR at [facility].    Medications: reviewed  Labs: reviewed  Anthropometrics:   62 lb on 5/18   Estimated Energy Needs  Kcals: 968-864 calories/d (montior with refeeding) likely will need 1340 calories/d for weight gain and repletion Protein: 28-33 g/d, likely will need 67 g/day Fluid: >87ml  NUTRITION DIAGNOSIS: Malnutrition continues   MALNUTRITION DIAGNOSIS: Severe malnutrition continues   INTERVENTION:  Have tried to call patient times 2 and message says that person is not excepting calls at this time.  No option to leave message.  Will attempt to call back at another time. Would like for patient to begin 1/2 can of tube feeding TID until seen back in clinic for labs. Spoke with Bode, Vaughan Basta and will deliver formula either Tuesday or Wednesday. Recommend checking comprehensive metabolic panel with Mag and Phosphorus on 5/25 during next lab draw.  Could benefit from oral MVI.    MONITORING, EVALUATION, GOAL: Patient will utilize PEG for nutrition to increase weight.     NEXT VISIT: May 25  Christopher Glasscock B. Zenia Resides, [place], [place] Registered Dietitian [phone] (pager)

## 2016-06-21 NOTE — Progress Notes (Signed)
Nutrition  Spoke with patient via phone and discussed tube feeding regimen.  Patient able to repeat back tube feeding regimen (1/2 can TID with water flush of 87ml water before and after each feeding) Will email tube feeding regimen to [email or web address].  Peggyann Zwiefelhofer B. Zenia Resides, [place], [place] Registered Dietitian [phone] (pager)

## 2016-06-22 ENCOUNTER — Ambulatory Visit (HOSPITAL_COMMUNITY)
Admission: RE | Admit: 2016-06-22 | Discharge: 2016-06-22 | Disposition: A | Discharge status: Home / Self Care | Payer: Medicaid - Out of State | Source: Ambulatory Visit | Attending: Adult Health | Admitting: Adult Health

## 2016-06-22 ENCOUNTER — Encounter (HOSPITAL_COMMUNITY): Payer: Self-pay

## 2016-06-22 DIAGNOSIS — E876 Hypokalemia: Secondary | ICD-10-CM | POA: Diagnosis not present

## 2016-06-22 DIAGNOSIS — F419 Anxiety disorder, unspecified: Secondary | ICD-10-CM | POA: Insufficient documentation

## 2016-06-22 DIAGNOSIS — E43 Unspecified severe protein-calorie malnutrition: Secondary | ICD-10-CM

## 2016-06-22 DIAGNOSIS — Z7952 Long term (current) use of systemic steroids: Secondary | ICD-10-CM | POA: Insufficient documentation

## 2016-06-22 DIAGNOSIS — C539 Malignant neoplasm of cervix uteri, unspecified: Secondary | ICD-10-CM | POA: Diagnosis not present

## 2016-06-22 DIAGNOSIS — R1310 Dysphagia, unspecified: Secondary | ICD-10-CM | POA: Diagnosis present

## 2016-06-22 DIAGNOSIS — Z87891 Personal history of nicotine dependence: Secondary | ICD-10-CM | POA: Insufficient documentation

## 2016-06-22 DIAGNOSIS — C787 Secondary malignant neoplasm of liver and intrahepatic bile duct: Secondary | ICD-10-CM | POA: Insufficient documentation

## 2016-06-22 DIAGNOSIS — Z681 Body mass index (BMI) 19 or less, adult: Secondary | ICD-10-CM | POA: Diagnosis not present

## 2016-06-22 HISTORY — PX: IR GASTROSTOMY TUBE MOD SED: IMG625

## 2016-06-22 LAB — BASIC METABOLIC PANEL
ANION GAP: 7 (ref 5–15)
BUN: 5 mg/dL — AB (ref 6–20)
CHLORIDE: 98 mmol/L — AB (ref 101–111)
CO2: 32 mmol/L (ref 22–32)
Calcium: 8.6 mg/dL — ABNORMAL LOW (ref 8.9–10.3)
Creatinine, Ser: 0.44 mg/dL (ref 0.44–1.00)
GFR calc Af Amer: >60 mL/min (ref >60)
GFR calc non Af Amer: >60 mL/min (ref >60)
GLUCOSE: 90 mg/dL (ref 65–99)
POTASSIUM: 2.8 mmol/L — AB (ref 3.5–5.1)
Sodium: 137 mmol/L (ref 135–145)

## 2016-06-22 LAB — CBC
HEMATOCRIT: 25.6 % — AB (ref 36.0–46.0)
HEMOGLOBIN: 8.4 g/dL — AB (ref 12.0–15.0)
MCH: 34 pg (ref 26.0–34.0)
MCHC: 32.8 g/dL (ref 30.0–36.0)
MCV: 103.6 fL — AB (ref 78.0–100.0)
Platelets: 436 10*3/uL — ABNORMAL HIGH (ref 150–400)
RBC: 2.47 MIL/uL — ABNORMAL LOW (ref 3.87–5.11)
RDW: 19.5 % — ABNORMAL HIGH (ref 11.5–15.5)
WBC: 8.1 10*3/uL (ref 4.0–10.5)

## 2016-06-22 LAB — APTT: APTT: 41 s — AB (ref 24–36)

## 2016-06-22 LAB — PROTIME-INR
INR: 1.04
Prothrombin Time: 13.7 seconds (ref 11.4–15.2)

## 2016-06-22 MED ORDER — HEPARIN SOD (PORK) LOCK FLUSH 100 UNIT/ML IV SOLN
INTRAVENOUS | Status: AC
  Administered 2016-06-22: 500 [IU] via INTRAVENOUS
  Filled 2016-06-22: qty 5

## 2016-06-22 MED ORDER — HYDROCODONE-ACETAMINOPHEN 5-325 MG PO TABS
1.0000 | ORAL_TABLET | Freq: Once | ORAL | Status: AC
  Administered 2016-06-22: 1 via ORAL

## 2016-06-22 MED ORDER — MIDAZOLAM HCL 2 MG/2ML IJ SOLN
INTRAMUSCULAR | Status: AC
  Filled 2016-06-22: qty 2

## 2016-06-22 MED ORDER — FENTANYL CITRATE (PF) 100 MCG/2ML IJ SOLN
INTRAMUSCULAR | Status: AC
  Filled 2016-06-22: qty 2

## 2016-06-22 MED ORDER — CEFAZOLIN SODIUM-DEXTROSE 2-4 GM/100ML-% IV SOLN
2.0000 g | INTRAVENOUS | Status: AC
  Administered 2016-06-22: 2 g via INTRAVENOUS

## 2016-06-22 MED ORDER — CEFAZOLIN SODIUM-DEXTROSE 2-4 GM/100ML-% IV SOLN
INTRAVENOUS | Status: AC
  Filled 2016-06-22: qty 100

## 2016-06-22 MED ORDER — BACITRACIN-NEOMYCIN-POLYMYXIN 400-5-5000 EX OINT: 1.0000 "application " | TOPICAL_OINTMENT | Freq: Every day | CUTANEOUS | Status: DC

## 2016-06-22 MED ORDER — FENTANYL CITRATE (PF) 100 MCG/2ML IJ SOLN
INTRAMUSCULAR | Status: AC | PRN
  Administered 2016-06-22: 50 ug via INTRAVENOUS
  Administered 2016-06-22 (×2): 25 ug via INTRAVENOUS

## 2016-06-22 MED ORDER — POTASSIUM CHLORIDE 10 MEQ/50ML IV SOLN
10.0000 meq | INTRAVENOUS | Status: AC
  Administered 2016-06-22 (×2): 10 meq via INTRAVENOUS
  Filled 2016-06-22 (×2): qty 50

## 2016-06-22 MED ORDER — LIDOCAINE HCL 1 % IJ SOLN
INTRAMUSCULAR | Status: AC
  Filled 2016-06-22: qty 20

## 2016-06-22 MED ORDER — IOPAMIDOL (ISOVUE-300) INJECTION 61%
INTRAVENOUS | Status: AC
  Administered 2016-06-22: 10 mL
  Filled 2016-06-22: qty 50

## 2016-06-22 MED ORDER — LIDOCAINE HCL 1 % IJ SOLN
INTRAMUSCULAR | Status: AC | PRN
  Administered 2016-06-22: 5 mL

## 2016-06-22 MED ORDER — MIDAZOLAM HCL 2 MG/2ML IJ SOLN
INTRAMUSCULAR | Status: AC | PRN
  Administered 2016-06-22 (×3): 1 mg via INTRAVENOUS

## 2016-06-22 MED ORDER — HEPARIN SOD (PORK) LOCK FLUSH 100 UNIT/ML IV SOLN
500.0000 [IU] | Freq: Once | INTRAVENOUS | Status: AC
  Administered 2016-06-22: 500 [IU] via INTRAVENOUS

## 2016-06-22 MED ORDER — HYDROCODONE-ACETAMINOPHEN 5-325 MG PO TABS
ORAL_TABLET | ORAL | Status: AC
  Administered 2016-06-22: 1 via ORAL
  Filled 2016-06-22: qty 1

## 2016-06-22 MED ORDER — SODIUM CHLORIDE 0.9 % IV SOLN
INTRAVENOUS | Status: DC
  Administered 2016-06-22: 12:00:00 via INTRAVENOUS

## 2016-06-22 NOTE — Discharge Instructions (Signed)
Excuse from Work, [employer], or Physical Activity ________________[name]_______________________________________ needs to be excused from: ____ Work __x__ [employer] ____ Physical activity beginning now and through the following date: ________________. He or she may return to work or school but should still avoid the following physical activity or activities from now until _____5/23/18 to care for her mother after a procedure__as her mother should not be alone during this time_________. Activity restrictions include: ____ Lifting more than _______ lb ____ Sitting longer than __________ minutes at a time ____ Standing longer than ________ minutes at a time ____ He or she may return to full physical activity as of ________________.   Date: ___5/22/18_____________ This information is not intended to replace advice given to you by your health care provider. Make sure you discuss any questions you have with your health care provider. Document Released: 07/14/2000 Document Revised: 01/02/2016 Document Reviewed: 08/20/2013 Elsevier Interactive Patient Education  2017 [place].                         Moderate Conscious Sedation, Adult, Care After These instructions provide you with information about caring for yourself after your procedure. Your health care provider may also give you more specific instructions. Your treatment has been planned according to current medical practices, but problems sometimes occur. Call your health care provider if you have any problems or questions after your procedure. What can I expect after the procedure? After your procedure, it is common:  To feel sleepy for several hours.  To feel clumsy and have poor balance for several hours.  To have poor judgment for several hours.  To vomit if you eat too soon. Follow these instructions at home: For at least 24 hours after the procedure:    Do not:  Participate in activities where  you could fall or become injured.  Drive.  Use heavy machinery.  Drink alcohol.  Take sleeping pills or medicines that cause drowsiness.  Make important decisions or sign legal documents.  Take care of children on your own.  Rest. Eating and drinking   Follow the diet recommended by your health care provider.  If you vomit:  Drink water, juice, or soup when you can drink without vomiting.  Make sure you have little or no nausea before eating solid foods. General instructions   Have a responsible adult stay with you until you are awake and alert.  Take over-the-counter and prescription medicines only as told by your health care provider.  If you smoke, do not smoke without supervision.  Keep all follow-up visits as told by your health care provider. This is important. Contact a health care provider if:  You keep feeling nauseous or you keep vomiting.  You feel light-headed.  You develop a rash.  You have a fever. Get help right away if:  You have trouble breathing. This information is not intended to replace advice given to you by your health care provider. Make sure you discuss any questions you have with your health care provider. Document Released: 11/08/2012 Document Revised: 06/23/2015 Document Reviewed: 05/10/2015 Elsevier Interactive Patient Education  2017 [place]. Gastrostomy Tube Home Guide, Adult A gastrostomy tube is a tube that is surgically placed into the stomach. It is also called a G-tube. G-tubes are used when a person is unable to eat and drink enough on their own to stay healthy. The tube is inserted into the stomach through a small cut (incision) in the skin. This tube is used  for:  Feeding.  Giving medication. Gastrostomy tube care  Wash your hands with soap and water.  Remove the old dressing (if any). Some styles of G-tubes may need a dressing inserted between the skin and the G-tube. Other types of G-tubes do not require a  dressing. Ask your health care provider if a dressing is needed.  Check the area where the tube enters the skin (insertion site) for redness, swelling, or pus-like (purulent) drainage. A small amount of clear or tan liquid drainage is normal. Check to make sure scar tissue (skin) is not growing around the insertion site. This could have a raised, bumpy appearance.  A cotton swab can be used to clean the skin around the tube:  When the G-tube is first put in, a normal saline solution or water can be used to clean the skin.  Mild soap and warm water can be used when the skin around the G-tube site has healed.  Roll the cotton swab around the G-tube insertion site to remove any drainage or crusting at the insertion site. Stomach residuals Feeding tube residuals are the amount of liquids that are in the stomach at any given time. Residuals may be checked before giving feedings, medications, or as instructed by your health care provider.  Ask your health care provider if there are instances when you would not start tube feedings depending on the amount or type of contents withdrawn from the stomach.  Check residuals by attaching a syringe to the G-tube and pulling back on the syringe plunger. Note the amount, and return the residual back into the stomach. Flushing the G-tube  The G-tube should be periodically flushed with clean warm water to keep it from clogging.  Flush the G-tube after feedings or medications. Draw up 30 mL of warm water in a syringe. Connect the syringe to the G-tube and slowly push the water into the tube.  Do not push feedings, medications, or flushes rapidly. Flush the G-tube gently and slowly.  Only use syringes made for G-tubes to flush medications or feedings.  Your health care provider may want the G-tube flushed more often or with more water. If this is the case, follow your health care provider's instructions. Feedings Your health care provider will determine  whether feedings are given as a bolus (a certain amount given at one time and at scheduled times) or whether feedings will be given continuously on a feeding pump.  Formulas should be given at room temperature.  If feedings are continuous, no more than 4 hours worth of feedings should be placed in the feeding bag. This helps prevent spoilage or accidental excess infusion.  Cover and place unused formula in the refrigerator.  If feedings are continuous, stop the feedings when medications or flushes are given. Be sure to restart the feedings.  Feeding bags and syringes should be replaced as instructed by your health care provider. Giving medication  In general, it is best if all medications are in a liquid form for G-tube administration. Liquid medications are less likely to clog the G-tube.  Mix the liquid medication with 30 mL (or amount recommended by your health care provider) of warm water.  Draw up the medication into the syringe.  Attach the syringe to the G-tube and slowly push the mixture into the G-tube.  After giving the medication, draw up 30 mL of warm water in the syringe and slowly flush the G-tube.  For pills or capsules, check with your health care provider first before  crushing medications. Some pills are not effective if they are crushed. Some capsules are sustained-release medications.  If appropriate, crush the pill or capsule and mix with 30 mL of warm water. Using the syringe, slowly push the medication through the tube, then flush the tube with another 30 mL of tap water. G-tube problems G-tube was pulled out.  Cause: May have been pulled out accidentally.  Solutions: Cover the opening with clean dressing and tape. Call your health care provider right away. The G-tube should be put in as soon as possible (within 4 hours) so the G-tube opening (tract) does not close. The G-tube needs to be put in at a health care setting. An X-ray needs to be done to confirm  placement before the G-tube can be used again. Redness, irritation, soreness, or foul odor around the gastrostomy site.  Cause: May be caused by leakage or infection.  Solutions: Call your health care provider right away. Large amount of leakage of fluid or mucus-like liquid present (a large amount means it soaks clothing).  Cause: Many reasons could cause the G-tube to leak.  Solutions: Call your health care provider to discuss the amount of leakage. Skin or scar tissue appears to be growing where tube enters skin.  Cause: Tissue growth may develop around the insertion site if the G-tube is moved or pulled on excessively.  Solutions: Secure tube with tape so that excess movement does not occur. Call your health care provider. G-tube is clogged.  Cause: Thick formula or medication.  Solutions: Try to slowly push warm water into the tube with a large syringe. Never try to push any object into the tube to unclog it. Do not force fluid into the G-tube. If you are unable to unclog the tube, call your health care provider right away. Tips  Head of bed (HOB) position refers to the upright position of a person's upper body.  When giving medications or a feeding bolus, keep the [facility] up as told by your health care provider. Do this during the feeding and for 1 hour after the feeding or medication administration.  If continuous feedings are being given, it is best to keep the [facility] up as told by your health care provider. When ADLs (activities of daily living) are performed and the [facility] needs to be flat, be sure to turn the feeding pump off. Restart the feeding pump when the [facility] is returned to the recommended height.  Do not pull or put tension on the tube.  To prevent fluid backflow, kink the G-tube before removing the cap or disconnecting a syringe.  Check the G-tube length every day. Measure from the insertion site to the end of the G-tube. If the length is longer than previous measurements,  the tube may be coming out. Call your health care provider if you notice increasing G-tube length.  Oral care, such as brushing teeth, must be continued.  You may need to remove excess air (vent) from the G-tube. Your health care provider will tell you if this is needed.  Always call your health care provider if you have questions or problems with the G-tube. Get help right away if:  You have severe abdominal pain, tenderness, or abdominal bloating (distension).  You have nausea or vomiting.  You are constipated or have problems moving your bowels.  The G-tube insertion site is red, swollen, has a foul smell, or has yellow or brown drainage.  You have difficulty breathing or shortness of breath.  You have a  fever.  You have a large amount of feeding tube residuals.  The G-tube is clogged and cannot be flushed. This information is not intended to replace advice given to you by your health care provider. Make sure you discuss any questions you have with your health care provider. Document Released: 03/29/2001 Document Revised: 06/26/2015 Document Reviewed: 09/25/2012 Elsevier Interactive Patient Education  2017 [employer].

## 2016-06-22 NOTE — H&P (Signed)
Chief Complaint: Patient was seen in consultation today for percutaneous gastric tube plaement at the request of Dawson,Gretchen W  Referring Physician(s): Dawson,Gretchen W Dr Everitt Amber  Supervising Physician: Corrie Mckusick  Patient Status: [facility] - Out-pt  History of Present Illness: Erika [name] is a 41 y.o. female   Cervical cancer Recurrence; liver mets 4/23 PET: IMPRESSION: 1. Continued reduction in size and metabolism of solitary mildly hypermetabolic right liver dome metastasis. No new or progressive hypermetabolic metastatic disease. 2. 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 GYN-ONC consultation advised for correlation with clinical history and pelvic examination. 3. Stable diffuse marrow hypermetabolism compatible with reactive/stimulated marrow state.  Severe protein calorie malnutrition Weight loss Request for percutaneous gastric tube placement per Dr Denman George   Past Medical History:  Diagnosis Date  . Anxiety   . Cervical cancer ([place])   . Cervical cancer, FIGO stage IIB ([place]) 04/09/2014  . Family history of adverse reaction to anesthesia    pt states her sister has to have the "reversal" after surgery   . Hypokalemia   . Microcytic hypochromic anemia   . Recurrent cervical cancer ([place]) 12/12/2015    Past Surgical History:  Procedure Laterality Date  . CESAREAN SECTION    . port a cath placement      January 2016 / right   . TANDEM RING INSERTION N/A 04/09/2014   Procedure: TANDEM RING PLACEMENT FOR HIGH DOSE RATE RADIATION THERAPY EXAM UNDER ANETHESIA PLACEMENT OF CERVICAL SLEEVE;  Surgeon: Gery Pray, MD;  Location: [facility] ORS;  Service: Urology;  Laterality: N/A;  . TANDEM RING INSERTION N/A 04/23/2014   Procedure: TANDEM RING PLACEMENT ;  Surgeon: Gery Pray, MD;  Location: [facility];  Service: Urology;  Laterality: N/A;  . TANDEM RING INSERTION N/A 04/30/2014   Procedure: TANDEM RING PLACEMENT,EXAM UNDER ANESTHESIA  ;  Surgeon: Gery Pray, MD;  Location: [facility];  Service: Urology;  Laterality: N/A;  . TANDEM RING INSERTION N/A 05/07/2014   Procedure: TANDEM RING PLACEMENT;  Surgeon: Gery Pray, MD;  Location: [facility];  Service: Urology;  Laterality: N/A;  . TANDEM RING INSERTION N/A 05/17/2014   Procedure: TANDEM RING INSERTION;  Surgeon: Gery Pray, MD;  Location: [facility];  Service: Urology;  Laterality: N/A;  . TUBAL LIGATION      Allergies: Patient has no known allergies.  Medications: Prior to Admission medications   Medication Sig Start Date End Date Taking? Authorizing Provider  HYDROcodone-acetaminophen (NORCO) 7.5-325 MG tablet Take 1 tablet by mouth every 4 (four) hours as needed for moderate pain. 06/08/16  Yes Holley Bouche, NP  prochlorperazine (COMPAZINE) 10 MG tablet Take 1 tablet (10 mg total) by mouth every 6 (six) hours as needed (Nausea or vomiting). 12/23/15  Yes Penland, Kelby Fam, MD  calcium-vitamin D (OSCAL 500/200 D-3) 500-200 MG-UNIT per tablet Take 1 tablet by mouth 2 (two) times daily. 06/10/14   Everitt Amber, MD  estrogen, conjugated,-medroxyprogesterone (PREMPRO) 0.3-1.5 MG per tablet Take 1 tablet by mouth daily. 06/10/14   Everitt Amber, MD  ferrous sulfate 325 (65 FE) MG tablet Take 325 mg by mouth daily with breakfast.    [provider]  lidocaine-prilocaine (EMLA) cream Apply to affected area once 12/23/15   Penland, Kelby Fam, MD  LORazepam (ATIVAN) 1 MG tablet TAKE 1 TABLET BY MOUTH AT BEDTIME AS NEEDED FOR ANXIETY /INSOMNIA 04/20/16   Baird Cancer, PA-C  naproxen sodium (ANAPROX) 220 MG tablet Take 220 mg  by mouth 4 (four) times daily as needed.    [provider]  Nutritional Supplements (FEEDING SUPPLEMENT, OSMOLITE 1.2 CAL,) LIQD Give 1 can 3 times per day.  Flush with 76m of water before and after feeding TID.   Begin with 1/2 can 3 times per day.   Send bolus tube feeding supplies. 06/21/16   Holley Bouche, NP    ondansetron (ZOFRAN) 8 MG tablet Take 1 tablet (8 mg total) by mouth 2 (two) times daily as needed for refractory nausea / vomiting. Start on day 3 after chemo. 12/23/15   Penland, Kelby Fam, MD  pantoprazole (PROTONIX) 40 MG tablet Take 1 tablet (40 mg total) by mouth daily. 06/08/16   Holley Bouche, NP  Potassium Chloride 40 MEQ/15ML (20%) SOLN Take 40 mEq by mouth 2 (two) times daily. 05/11/16   Holley Bouche, NP  predniSONE (DELTASONE) 20 MG tablet Take 1 tablet (20 mg total) by mouth daily with breakfast. 06/08/16   Holley Bouche, NP     Family History  Problem Relation Age of Onset  . Asthma Mother   . Heart attack Father     Social History   Social History  . Marital status: Single    Spouse name: N/A  . Number of children: 2  . Years of education: N/A   Occupational History  . cook at [employer] History Main Topics  . Smoking status: Former Smoker    Packs/day: 0.50    Years: 6.00    Types: Cigarettes    Quit date: 08/02/2011  . Smokeless tobacco: Never Used  . Alcohol use No  . Drug use: Yes    Types: Marijuana  . Sexual activity: Yes   Other Topics Concern  . None   Social History Narrative  . None    Review of Systems: A 12 point ROS discussed and pertinent positives are indicated in the HPI above.  All other systems are negative.  Review of Systems  Constitutional: Positive for appetite change, fatigue and unexpected weight change. Negative for activity change and fever.  Respiratory: Negative for cough and shortness of breath.   Cardiovascular: Negative for chest pain.  Gastrointestinal: Negative for abdominal pain.  Neurological: Positive for weakness.  Psychiatric/Behavioral: Negative for behavioral problems and confusion.    Vital Signs: BP 95/74 (BP Location: Right Arm)   Pulse 100   Temp 98 F (36.7 C) (Oral)   Ht 5' (1.524 m)   Wt 62 lb (28.1 kg)   SpO2 100%   BMI 12.11 kg/m   Physical Exam  Constitutional: She is  oriented to person, place, and time.  Cardiovascular: Normal rate, regular rhythm and normal heart sounds.   Pulmonary/Chest: Effort normal and breath sounds normal.  Abdominal: Soft. Bowel sounds are normal.  Musculoskeletal: Normal range of motion.  Neurological: She is alert and oriented to person, place, and time.  Skin: Skin is warm and dry.  Right PAC in place- functional  Psychiatric: She has a normal mood and affect. Her behavior is normal. Judgment and thought content normal.  Nursing note and vitals reviewed.   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 EXAM: NUCLEAR  MEDICINE PET SKULL BASE TO THIGH TECHNIQUE: 4.9 mCi F-18 FDG was injected intravenously. Full-ring PET imaging was performed from the skull base to thigh after the radiotracer. CT data was obtained and used for attenuation correction and anatomic localization. FASTING BLOOD GLUCOSE:  Value: 110 mg/dl COMPARISON:  04/08/2016 PET-CT. FINDINGS: NECK No hypermetabolic lymph nodes in the neck. CHEST Right internal jugular MediPort terminates in the upper third of the superior vena cava. Nonaneurysmal thoracic aorta. No pneumothorax. No pleural effusions. No enlarged or hypermetabolic axillary, mediastinal or hilar lymph nodes. No acute consolidative airspace disease, lung masses or significant pulmonary nodules. ABDOMEN/PELVIS There is a mildly hypermetabolic 0.7 x 0.7 cm right liver dome mass with max SUV 3.1 (series 4/image 90), previously 1.5 x 1.2 cm on 04/08/2016 with max SUV 3.3, decreased in size and slightly decreased in metabolism. No additional hypermetabolic liver masses.  There is stool like material filling the upper third of the vagina with focally indistinct anterior rectal wall and associated diffuse hypermetabolism in the upper third of the vagina, all suggestive of a rectovaginal fistula, unchanged. Nonspecific presacral space fat stranding and ill-defined fluid, unchanged, compatible with post treatment change. No abnormal hypermetabolic activity within the pancreas, adrenal glands, or spleen. No hypermetabolic lymph nodes in the abdomen or pelvis. SKELETON No focal hypermetabolic activity to suggest skeletal metastasis. Stable diffuse marrow hypermetabolism. IMPRESSION: 1. Continued reduction in size and metabolism of solitary mildly hypermetabolic right liver dome metastasis. No new or progressive hypermetabolic metastatic disease. 2. 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 GYN-ONC consultation advised for correlation with clinical history and pelvic examination. 3. Stable diffuse marrow hypermetabolism compatible with reactive/stimulated marrow state. Electronically Signed   By: Ilona Sorrel M.D.   On: 05/24/2016 12:19    Labs:  CBC:  Recent Labs  04/20/16 0929 05/11/16 1133 05/14/16 1006 06/18/16 1103  WBC 9.1 11.1* 13.8* 9.5  HGB 10.0* 9.2* 9.3* 8.8*  HCT 30.9* 28.7* 29.0* 27.0*  PLT 183 339 341 390    COAGS:  Recent Labs  11/26/15 1103 01/09/16 1253  INR 0.94 1.04  APTT 29 30    BMP:  Recent Labs  04/20/16 0929 05/11/16 1133 05/14/16 1006 06/18/16 1103  NA 137 137 134* 136  K 2.9* 2.7* 2.9* 3.6  CL 96* 95* 93* 99*  CO2 32 34* 32 28  GLUCOSE 94 89 89 101*  BUN 13 8 9  5*  CALCIUM 9.0 8.9 8.9 8.6*  CREATININE 0.54 0.43* 0.53 0.44  GFRNONAA >60 >60 >60 >60  GFRAA >60 >60 >60 >60    LIVER FUNCTION TESTS:  Recent Labs  04/20/16 0929 05/11/16 1133 05/14/16 1006 06/18/16 1103  BILITOT 0.4 0.5 0.4 0.3  AST 12* 14* 15 17  ALT 7* 9* 10* 8*  ALKPHOS 98 107 106 129*  PROT 7.4 7.1 7.2  6.5  ALBUMIN 3.0* 2.6* 2.7* 2.3*    TUMOR MARKERS: No results for input(s): AFPTM, CEA, CA199, CHROMGRNA in the last 8760 hours.  Assessment and Plan:  Severe PCM; wt loss Cervical Ca recurrence with liver metastasis Scheduled for percutaneous gastric tube placement Risks and Benefits discussed with the patient including, but not limited to the need for a barium enema during the procedure, bleeding, infection, peritonitis, or damage to adjacent structures. All of the patient's questions were answered, patient is agreeable to proceed. Consent signed and in chart.  Thank you for this interesting consult.  I greatly enjoyed meeting Erika [name] and look forward to participating  in their care.  A copy of this report was sent to the requesting provider on this date.  Electronically Signed: Lavonia Drafts, PA-C 06/22/2016, 11:17 AM   I spent a total of  30 Minutes   in face to face in clinical consultation, greater than 50% of which was counseling/coordinating care for perc Gastric tube placement

## 2016-06-22 NOTE — Sedation Documentation (Signed)
Patient denies pain and is resting comfortably.  

## 2016-06-22 NOTE — Sedation Documentation (Signed)
Patient is resting comfortably. 

## 2016-06-22 NOTE — Procedures (Signed)
Interventional Radiology Procedure Note  Procedure: Placement of percutaneous 20F pull-through gastrostomy tube. Complications: None Recommendations: - NPO except for sips and chips remainder of today and overnight - Maintain G-tube to LWS until tomorrow morning  - May advance diet as tolerated and begin using tube tomorrow morning  Signed,   Jla Reynolds S. Mariadelosang Wynns, DO   

## 2016-06-24 ENCOUNTER — Encounter (HOSPITAL_COMMUNITY): Payer: Self-pay

## 2016-06-24 ENCOUNTER — Telehealth (HOSPITAL_COMMUNITY): Payer: Self-pay | Admitting: *Deleted

## 2016-06-24 NOTE — Progress Notes (Signed)
Nutrition  Late entry:  Received page from patient yesterday regarding having bilateral lower extremity swelling and questions regarding starting feeding in new PEG tube.  Answered questions regarding feeding with new PEG tube.  Recommend patient call [facility] to speak with RN/provider regarding bilateral lower extremity swelling.  Patient verbalized understanding.  Bruce Churilla B. Zenia Resides, [place], [place] Registered Dietitian [phone] (pager)

## 2016-06-24 NOTE — Progress Notes (Signed)
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 Denman George      02/18/2014 Pathology Results    Cervix, biopsy - SQUAMOUS CELL CARCINOMA.      03/07/2014 - 04/24/2014 Radiation Therapy    Dr. Sondra Come- vaginal brachytherapy, 45 Gy to pelvis with sidewall boost 9 Gy and HDR 28.5 Gy in 5 fractions      03/08/2014 - 04/03/2014 Chemotherapy    Weekly CDDP 40 mg/m2 by Dr. 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      07/12/2014 PET scan    Marked interval decrease in size and hypermetabolism of the cervical mass.  The bilateral hypermetabolic pelvic sidewall lymphadenopathy has resolved in the interval.  Areas of hypermetabolic brown fat bilaterally in the chest and abdomen.      11/05/2015 Imaging    MRI abdomen at [facility]- 7.0 x 5.3 cm are in left hepatic lobe with central 3.1 x 2.7 x 2.3 cm complex cystic area, as well as an apparent subcapsular foci      11/26/2015 Procedure    US aspiration of left hepatic lobe- yielding 13 cc of yellow purulent fluid (pain resolved and PO intake increased)      11/26/2015 Pathology Results    [id number]- highly suspicious for squamous cell carcinoma      12/09/2015 PET scan    New small hypermetabolic right internal mammary soft tissue density, suspicious for metastatic lymphadenopathy.  Increased size and number of hypermetabolic liver metastases. New mild hypermetabolic activity in porta hepatis is suspicious for lymph node metastases.  No residual hypermetabolic cervical mass identified.  Multiple uterine fibroids again seen without FDG uptake.  New diffuse rectal wall thickening with hypermetabolic activity, suspicious for proctitis which could be infectious in etiology or secondary to radiation changes ; recommend clinical correlation. Small pelvic fluid collection between the anterior rectal wall and posterior uterine wall, possibly due to abscess.      12/12/2015 Initial Diagnosis    Recurrent cervical cancer ([place])     01/02/2016 Procedure    Port placed by Dr. Ladona Horns      01/09/2016 Procedure    US biopsy      01/12/2016 Pathology Results    Diagnosis Liver, needle/core biopsy - METASTATIC SQUAMOUS CELL CARCINOMA.      01/21/2016 -  Chemotherapy    The patient had palonosetron (ALOXI) injection 0.25 mg, 0.25 mg, Intravenous,  Once, 0 of 4 cycles  bevacizumab (AVASTIN) 625 mg in sodium chloride 0.9 % 100 mL chemo infusion, 15 mg/kg = 625 mg (100 % of original dose 15 mg/kg), Intravenous,  Once, 0 of 4 cycles Dose modification: 15 mg/kg (original dose 15 mg/kg, Cycle 1, Reason: Provider Judgment)  CARBOplatin (PARAPLATIN) in sodium chloride 0.9 % 100 mL chemo infusion,  (original dose ), Intravenous,  Once, 0 of 4 cycles Dose modification:   (Cycle 1)  PACLitaxel (TAXOL) 234 mg in dextrose 5 % 250 mL chemo infusion (> 80mg /m2), 175 mg/m2 = 234 mg, Intravenous,  Once,  0 of 4 cycles  for chemotherapy treatment.        04/08/2016 PET scan    Much improved PET-CT when compared to prior study from 2017. There are 2 slightly hypermetabolic residual hepatic lesions.  Diffuse marrow activity likely due to rebound or marrow stimulating drugs.      05/24/2016 PET scan    1. Continued reduction in size and metabolism of solitary mildly hypermetabolic right liver dome metastasis. No new or progressive hypermetabolic metastatic disease. 2. 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 GYN-ONC consultation advised for  correlation with clinical history and pelvic examination. 3. Stable diffuse marrow hypermetabolism compatible with reactive/stimulated marrow state.      06/22/2016 Procedure    Placement of PEG tube for severe protein calorie malnutrition. (placed by IR at [facility]).         INTERVAL HISTORY:  Ms. Kaniesha Barile 41 y.o. female returns for follow-up visit.   She is here today with her significant other and her youngest daughter.   She had PEG tube placement on 06/22/16; she tolerated procedure well. She continues to have some abdominal soreness today. She is tolerating very small volumes of tube feeds "okay." Endorses some feelings of "being too full" at times with the water flushes and feedings.  She had home health nurse come out to her home recently and she feels confident doing things on her own now.    She has noticed bilateral feet swelling that started within the past few days. Her weight is stable today at 62 lbs (she has not lost any additional weight in the past week). She and her boyfriend tell me she has been able to eat a little bit more too.  She feels more hopeful that she will get well now.   The dark brown vaginal discharge has resolved; no recurrent symptoms since last episode.   She is requesting refill of her pain medication and the Compazine. She has consistent abdominal pain (which is not new) and it is difficult for her to walk and get comfortable when sitting at times as well.  The Norco remains helpful.  She has not had a BM since Tuesday.     REVIEW OF SYSTEMS:  Review of Systems  Constitutional: Positive for fatigue. Negative for chills and fever.  HENT:  Negative.   Eyes: Negative.   Respiratory: Negative.   Cardiovascular: Positive for leg swelling (feet swelling ). Negative for chest pain.  Gastrointestinal: Positive for abdominal pain, constipation and nausea. Negative for vomiting.  Endocrine: Negative.   Genitourinary: Negative.  Negative for dysuria,  hematuria and vaginal discharge.   Musculoskeletal: Positive for back pain.  Skin: Negative.   Neurological: Negative.   Hematological: Negative.   Psychiatric/Behavioral: Negative.      PAST MEDICAL/SURGICAL HISTORY:  Past Medical History:  Diagnosis Date  . Anxiety   . Cervical cancer ([place])   . Cervical cancer, FIGO stage IIB ([place]) 04/09/2014  . Family history of adverse reaction to anesthesia    pt states her sister has to have the "reversal" after surgery   . Hypokalemia   . Microcytic hypochromic anemia   . Recurrent cervical cancer ([place]) 12/12/2015   Past Surgical History:  Procedure Laterality Date  . CESAREAN SECTION    . IR GASTROSTOMY TUBE MOD SED  06/22/2016  . port a cath placement      January 2016 / right   . TANDEM RING INSERTION N/A 04/09/2014   Procedure: TANDEM  RING PLACEMENT FOR HIGH DOSE RATE RADIATION THERAPY EXAM UNDER ANETHESIA PLACEMENT OF CERVICAL SLEEVE;  Surgeon: Gery Pray, MD;  Location: [facility];  Service: Urology;  Laterality: N/A;  . TANDEM RING INSERTION N/A 04/23/2014   Procedure: TANDEM RING PLACEMENT ;  Surgeon: Gery Pray, MD;  Location: [facility];  Service: Urology;  Laterality: N/A;  . TANDEM RING INSERTION N/A 04/30/2014   Procedure: TANDEM RING PLACEMENT,EXAM UNDER ANESTHESIA ;  Surgeon: Gery Pray, MD;  Location: [facility];  Service: Urology;  Laterality: N/A;  . TANDEM RING INSERTION N/A 05/07/2014   Procedure: TANDEM RING PLACEMENT;  Surgeon: Gery Pray, MD;  Location: [facility];  Service: Urology;  Laterality: N/A;  . TANDEM RING INSERTION N/A 05/17/2014   Procedure: TANDEM RING INSERTION;  Surgeon: Gery Pray, MD;  Location: [facility];  Service: Urology;  Laterality: N/A;  . TUBAL LIGATION       SOCIAL HISTORY:  Social History   Social History  . Marital status: Single    Spouse name: N/A  . Number of children: 2  . Years of education: N/A   Occupational History  . cook at [employer] History Main Topics    . Smoking status: Former Smoker    Packs/day: 0.50    Years: 6.00    Types: Cigarettes    Quit date: 08/02/2011  . Smokeless tobacco: Never Used  . Alcohol use No  . Drug use: Yes    Types: Marijuana  . Sexual activity: Yes   Other Topics Concern  . Not on file   Social History Narrative  . No narrative on file    FAMILY HISTORY:  Family History  Problem Relation Age of Onset  . Asthma Mother   . Heart attack Father     CURRENT MEDICATIONS:  Outpatient Encounter Prescriptions as of 06/25/2016  Medication Sig  . calcium-vitamin D (OSCAL 500/200 D-3) 500-200 MG-UNIT per tablet Take 1 tablet by mouth 2 (two) times daily.  Marland Kitchen estrogen, conjugated,-medroxyprogesterone (PREMPRO) 0.3-1.5 MG per tablet Take 1 tablet by mouth daily. (Patient taking differently: Take 1 tablet by mouth See admin instructions. Take 1 tablet by mouth daily for 2 weeks then stop for 2 weeks)  . ferrous sulfate 325 (65 FE) MG tablet Take 325 mg by mouth daily with breakfast.  . HYDROcodone-acetaminophen (NORCO) 7.5-325 MG tablet Take 1 tablet by mouth every 4 (four) hours as needed for moderate pain.  Marland Kitchen lidocaine-prilocaine (EMLA) cream Apply to affected area once  . LORazepam (ATIVAN) 1 MG tablet TAKE 1 TABLET BY MOUTH AT BEDTIME AS NEEDED FOR ANXIETY /INSOMNIA  . naproxen sodium (ANAPROX) 220 MG tablet Take 220 mg by mouth 4 (four) times daily as needed (for pain).   . Nutritional Supplements (FEEDING SUPPLEMENT, OSMOLITE 1.2 CAL,) LIQD Give 1 can 3 times per day.  Flush with 68m of water before and after feeding TID.   Begin with 1/2 can 3 times per day.   Send bolus tube feeding supplies.  Marland Kitchen ondansetron (ZOFRAN) 8 MG tablet Take 1 tablet (8 mg total) by mouth 2 (two) times daily as needed for refractory nausea / vomiting. Start on day 3 after chemo.  . pantoprazole (PROTONIX) 40 MG tablet Take 1 tablet (40 mg total) by mouth daily.  . Potassium Chloride 40 MEQ/15ML (20%) SOLN Take 40 mEq by mouth 2 (two)  times daily.  . predniSONE (DELTASONE) 20 MG tablet Take 1 tablet (20 mg total) by mouth daily with breakfast.  .  prochlorperazine (COMPAZINE) 10 MG tablet Take 1 tablet (10 mg total) by mouth every 6 (six) hours as needed (Nausea or vomiting).  . [DISCONTINUED] HYDROcodone-acetaminophen (NORCO) 7.5-325 MG tablet Take 1 tablet by mouth every 4 (four) hours as needed for moderate pain.  . [DISCONTINUED] Potassium Chloride 40 MEQ/15ML (20%) SOLN Take 40 mEq by mouth 2 (two) times daily.  . [DISCONTINUED] prochlorperazine (COMPAZINE) 10 MG tablet Take 1 tablet (10 mg total) by mouth every 6 (six) hours as needed (Nausea or vomiting).  . [DISCONTINUED] 0.9 %  sodium chloride infusion    No facility-administered encounter medications on file as of 06/25/2016.     ALLERGIES:  No Known Allergies   PHYSICAL EXAM:  ECOG Performance status: 2 - Symptomatic; requires occasional assistance.       Physical Exam  Constitutional: She is oriented to person, place, and time.  Emaciated female in no distress.  Seen seated in treatment bed in infusion area.   HENT:  Head: Normocephalic.  Mouth/Throat: Oropharynx is clear and moist. No oropharyngeal exudate.  Eyes: Conjunctivae are normal. No scleral icterus.  Neck: Normal range of motion. Neck supple.  Cardiovascular: Regular rhythm.   Mild tachycardia  Pulmonary/Chest: Effort normal and breath sounds normal. No respiratory distress. She has no wheezes. She has no rales.  Abdominal: Soft. There is tenderness (Mild tenderness at area of G-tube (as expected)).  G-tube in place   Musculoskeletal: Normal range of motion. She exhibits edema (1+ non-pitting bilateral pedal edema. ).  Lymphadenopathy:    She has no cervical adenopathy.  Neurological: She is alert and oriented to person, place, and time. No cranial nerve deficit.  Skin: Skin is warm and dry. No rash noted.  Psychiatric: Mood, memory, affect and judgment normal.  Nursing note and vitals  reviewed.    LABORATORY DATA:  I have reviewed the labs as listed.  CBC    Component Value Date/Time   WBC 6.6 06/25/2016 1000   RBC 2.65 (L) 06/25/2016 1000   HGB 9.0 (L) 06/25/2016 1000   HGB 12.0 12/12/2015 0920   HCT 27.8 (L) 06/25/2016 1000   HCT 37.6 12/12/2015 0920   PLT 434 (H) 06/25/2016 1000   PLT 289 12/12/2015 0920   MCV 104.9 (H) 06/25/2016 1000   MCV 89.1 12/12/2015 0920   MCH 34.0 06/25/2016 1000   MCHC 32.4 06/25/2016 1000   RDW 18.9 (H) 06/25/2016 1000   RDW 15.5 (H) 12/12/2015 0920   LYMPHSABS 1.2 06/25/2016 1000   LYMPHSABS 1.0 12/12/2015 0920   MONOABS 0.7 06/25/2016 1000   MONOABS 0.3 12/12/2015 0920   EOSABS 0.0 06/25/2016 1000   EOSABS 0.0 12/12/2015 0920   BASOSABS 0.0 06/25/2016 1000   BASOSABS 0.0 12/12/2015 0920   CMP Latest Ref Rng & Units 06/25/2016 06/22/2016 06/18/2016  Glucose 65 - 99 mg/dL 99 90 101(H)  BUN 6 - 20 mg/dL 9 5(L) 5(L)  Creatinine 0.44 - 1.00 mg/dL 0.46 0.44 0.44  Sodium 135 - 145 mmol/L 136 137 136  Potassium 3.5 - 5.1 mmol/L 2.9(L) 2.8(L) 3.6  Chloride 101 - 111 mmol/L 96(L) 98(L) 99(L)  CO2 22 - 32 mmol/L 31 32 28  Calcium 8.9 - 10.3 mg/dL 8.4(L) 8.6(L) 8.6(L)  Total Protein 6.5 - 8.1 g/dL 6.4(L) - 6.5  Total Bilirubin 0.3 - 1.2 mg/dL 0.3 - 0.3  Alkaline Phos 38 - 126 U/L 123 - 129(H)  AST 15 - 41 U/L 15 - 17  ALT 14 - 54 U/L 8(L) - 8(L)  Results for NICKOLA, LENIG (MRN [id number])   Ref. Range 06/25/2016 10:00  Phosphorus Latest Ref Range: 2.5 - 4.6 mg/dL 2.6  Magnesium Latest Ref Range: 1.7 - 2.4 mg/dL 1.7    PENDING LABS:     DIAGNOSTIC IMAGING:  *The following radiologic images and reports have been reviewed independently and agree with below findings.  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 Most recent restaging PET scan 05/24/16 revealed continued reduction in size and metabolism of right liver mets; no new/progressive metastatic disease. 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  Patient saw Dr. Denman George with Gyn-Onc on 05/24/16; she recommends no additional chemotherapy at this time given patient's poor tolerance and weight loss.  -Serial tumor markers have been largely stable.  -Follow-up with Dr. Denman George in 08/2016 with subsequent restaging PET scan.  -Return to cancer center in 2 weeks for continued follow-up/supportive care.   Severe protein calorie malnutrition:  -Weight stable at 62 lbs x 1 week; she now has PEG tube placement and we are starting very slowly with her bolus tube feedings.  Currently, she is prescribed 1/2 can TID with 40 mL water before and after each feeding. Also encouraged her to continue to eat by mouth as tolerated as well.  -She is at very high risk of refeeding syndrome.  I discussed these symptoms and the importance of lab monitoring during initiation of tube feedings in those at high risk for refeeding.  Symptoms of refeeding include: Hypophosphatemia, hypokalemia, vitamin deficiencies, congestive heart failure, peripheral edema, rhabdomyolysis, seizures, and hemolysis. Hypophosphatemia is the hallmark of refeeding syndrome. Serum mag and phos were normal today. She does have hypokalemia (see below), which has been chronic before tube feedings. She was seen today by Jennet Maduro, RD as well.  -Return in 1 week for labs only (CBC with diff, CMET, Mg, Phos).  -Return to cancer center in 2 weeks for follow-up visit with myself and dietitian.   Bilateral pedal edema:  -Likely secondary to hypoalbuminemia. Encouraged continued nutrition and hydration with tube feedings and by mouth. Keep feet elevated, as tolerated.   Neoplasm related pain:  -Continue Norco as previously prescribed.  -[place] Controlled Substance Registry reviewed; refill appropriate  and paper prescription given to patient today.       Constipation:  -Likely secondary to opiates.  -Recommend continued bowel regimen with Miralax and stool softeners.   Hypokalemia:  -Potassium low at 2.9 today. 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  She would like to do potassium supplementation at home rather than staying for IV potassium, which is reasonable.  -Provided written instructions for her to take 40 mEq KCl solution either po or VT BID until we see her again.  She will likely need more than 80 mEq KCl per day. However, given her increased risk of refeeding syndrome with electrolyte imbalance, we will continue to monitor her electrolytes closely.  -Return in 1 week for labs only as stated above.       Dispo:  -Labs only in 1 week (CBC with diff, CMET, Mg, & Phos).  -Return to cancer center in 2 weeks with labs and for follow-up with myself and dietitian.       All questions were answered  to patient's stated satisfaction. Encouraged patient to call with any new concerns or questions before her next visit to the cancer center and we can certain see her sooner, if needed.    Plan of care discussed with Dr. Talbert Cage, who agrees with the above aforementioned.     Orders placed this encounter:  Orders Placed This Encounter  Procedures  . CBC with Differential/Platelet  . Comprehensive metabolic panel  . Magnesium  . Phosphorus      Mike Craze, NP [place] [phone]

## 2016-06-25 ENCOUNTER — Encounter (HOSPITAL_BASED_OUTPATIENT_CLINIC_OR_DEPARTMENT_OTHER): Payer: PRIVATE HEALTH INSURANCE | Admitting: Adult Health

## 2016-06-25 ENCOUNTER — Encounter (HOSPITAL_BASED_OUTPATIENT_CLINIC_OR_DEPARTMENT_OTHER): Payer: PRIVATE HEALTH INSURANCE

## 2016-06-25 ENCOUNTER — Encounter (HOSPITAL_COMMUNITY): Payer: Self-pay | Admitting: Adult Health

## 2016-06-25 ENCOUNTER — Encounter (HOSPITAL_COMMUNITY): Payer: PRIVATE HEALTH INSURANCE

## 2016-06-25 VITALS — BP 107/63 | HR 113 | Temp 98.3°F | Resp 16 | Wt <= 1120 oz

## 2016-06-25 DIAGNOSIS — C787 Secondary malignant neoplasm of liver and intrahepatic bile duct: Secondary | ICD-10-CM | POA: Diagnosis not present

## 2016-06-25 DIAGNOSIS — R609 Edema, unspecified: Secondary | ICD-10-CM

## 2016-06-25 DIAGNOSIS — K5900 Constipation, unspecified: Secondary | ICD-10-CM | POA: Diagnosis not present

## 2016-06-25 DIAGNOSIS — Z931 Gastrostomy status: Secondary | ICD-10-CM | POA: Diagnosis not present

## 2016-06-25 DIAGNOSIS — G893 Neoplasm related pain (acute) (chronic): Secondary | ICD-10-CM

## 2016-06-25 DIAGNOSIS — E43 Unspecified severe protein-calorie malnutrition: Secondary | ICD-10-CM

## 2016-06-25 DIAGNOSIS — C539 Malignant neoplasm of cervix uteri, unspecified: Secondary | ICD-10-CM

## 2016-06-25 DIAGNOSIS — E876 Hypokalemia: Secondary | ICD-10-CM

## 2016-06-25 DIAGNOSIS — Z789 Other specified health status: Secondary | ICD-10-CM

## 2016-06-25 DIAGNOSIS — Z95828 Presence of other vascular implants and grafts: Secondary | ICD-10-CM

## 2016-06-25 LAB — COMPREHENSIVE METABOLIC PANEL
ALT: 8 U/L — ABNORMAL LOW (ref 14–54)
ANION GAP: 9 (ref 5–15)
AST: 15 U/L (ref 15–41)
Albumin: 2.2 g/dL — ABNORMAL LOW (ref 3.5–5.0)
Alkaline Phosphatase: 123 U/L (ref 38–126)
BUN: 9 mg/dL (ref 6–20)
CHLORIDE: 96 mmol/L — AB (ref 101–111)
CO2: 31 mmol/L (ref 22–32)
Calcium: 8.4 mg/dL — ABNORMAL LOW (ref 8.9–10.3)
Creatinine, Ser: 0.46 mg/dL (ref 0.44–1.00)
GFR calc Af Amer: >60 mL/min (ref >60)
Glucose, Bld: 99 mg/dL (ref 65–99)
POTASSIUM: 2.9 mmol/L — AB (ref 3.5–5.1)
Sodium: 136 mmol/L (ref 135–145)
Total Bilirubin: 0.3 mg/dL (ref 0.3–1.2)
Total Protein: 6.4 g/dL — ABNORMAL LOW (ref 6.5–8.1)

## 2016-06-25 LAB — CBC WITH DIFFERENTIAL/PLATELET
BASOS ABS: 0 10*3/uL (ref 0.0–0.1)
BASOS PCT: 0 %
EOS PCT: 0 %
Eosinophils Absolute: 0 10*3/uL (ref 0.0–0.7)
HCT: 27.8 % — ABNORMAL LOW (ref 36.0–46.0)
Hemoglobin: 9 g/dL — ABNORMAL LOW (ref 12.0–15.0)
LYMPHS PCT: 18 %
Lymphs Abs: 1.2 10*3/uL (ref 0.7–4.0)
MCH: 34 pg (ref 26.0–34.0)
MCHC: 32.4 g/dL (ref 30.0–36.0)
MCV: 104.9 fL — AB (ref 78.0–100.0)
Monocytes Absolute: 0.7 10*3/uL (ref 0.1–1.0)
Monocytes Relative: 11 %
Neutro Abs: 4.7 10*3/uL (ref 1.7–7.7)
Neutrophils Relative %: 71 %
Platelets: 434 10*3/uL — ABNORMAL HIGH (ref 150–400)
RBC: 2.65 MIL/uL — ABNORMAL LOW (ref 3.87–5.11)
RDW: 18.9 % — AB (ref 11.5–15.5)
WBC: 6.6 10*3/uL (ref 4.0–10.5)

## 2016-06-25 LAB — MAGNESIUM: MAGNESIUM: 1.7 mg/dL (ref 1.7–2.4)

## 2016-06-25 LAB — PHOSPHORUS: PHOSPHORUS: 2.6 mg/dL (ref 2.5–4.6)

## 2016-06-25 MED ORDER — HEPARIN SOD (PORK) LOCK FLUSH 100 UNIT/ML IV SOLN
INTRAVENOUS | Status: AC
  Filled 2016-06-25: qty 5

## 2016-06-25 MED ORDER — HEPARIN SOD (PORK) LOCK FLUSH 100 UNIT/ML IV SOLN
500.0000 [IU] | Freq: Once | INTRAVENOUS | Status: AC
  Administered 2016-06-25: 500 [IU] via INTRAVENOUS

## 2016-06-25 MED ORDER — PROCHLORPERAZINE MALEATE 10 MG PO TABS: 10.0000 mg | ORAL_TABLET | Freq: Four times a day (QID) | ORAL | 3 refills | Status: AC | PRN

## 2016-06-25 MED ORDER — POTASSIUM CHLORIDE 40 MEQ/15ML (20%) PO SOLN: 40.0000 meq | Freq: Two times a day (BID) | ORAL | 2 refills | Status: DC

## 2016-06-25 MED ORDER — SODIUM CHLORIDE 0.9 % IV SOLN
INTRAVENOUS | Status: DC
  Administered 2016-06-25: 10:00:00 via INTRAVENOUS

## 2016-06-25 MED ORDER — HYDROCODONE-ACETAMINOPHEN 7.5-325 MG PO TABS: 1.0000 | ORAL_TABLET | ORAL | 0 refills | Status: DC | PRN

## 2016-06-25 NOTE — Progress Notes (Signed)
Mason Jim presented for Portacath access and flush. Proper placement of portacath confirmed by CXR. Portacath located right chest wall accessed with  H 20 needle. Good blood return present. Portacath flushed with 84ml NS and 500U/70ml Heparin and needle removed intact. Procedure without incident. Patient tolerated procedure well.

## 2016-06-25 NOTE — Patient Instructions (Addendum)
[place] at [facility] Discharge Instructions  RECOMMENDATIONS MADE BY THE CONSULTANT AND ANY TEST RESULTS WILL BE SENT TO YOUR REFERRING PHYSICIAN.  You were seen today by Mike Craze NP. Potassium will be sent to your pharmacy. Take 40 mEq twice a day via feeding tube.  Return in 1 week for labs. Return in 2 weeks for follow up and to see Nutritionist.   Thank you for choosing [place] at [facility] to provide your oncology and hematology care.  To afford each patient quality time with our provider, please arrive at least 15 minutes before your scheduled appointment time.    If you have a lab appointment with the [facility] please come in thru the  Main Entrance and check in at the main information desk  You need to re-schedule your appointment should you arrive 10 or more minutes late.  We strive to give you quality time with our providers, and arriving late affects you and other patients whose appointments are after yours.  Also, if you no show three or more times for appointments you may be dismissed from the clinic at the providers discretion.     Again, thank you for choosing [facility].  Our hope is that these requests will decrease the amount of time that you wait before being seen by our physicians.       _____________________________________________________________  Should you have questions after your visit to [facility], please contact our office at [phone] between the hours of 8:30 a.m. and 4:30 p.m.  Voicemails left after 4:30 p.m. will not be returned until the following business day.  For prescription refill requests, have your pharmacy contact our office.       Resources For Cancer Patients and their Caregivers ? American Cancer Society: Can assist with transportation, wigs, general needs, runs Look Good Feel Better.        (251)448-2220 ? Cancer Care: Provides financial  assistance, online support groups, medication/co-pay assistance.  1-800-813-HOPE 5615297243) ? [place] Assists [place] cancer patients and their families through emotional , educational and financial support.  (737) 422-2474 ? [place] DSS Where to apply for food stamps, Medicaid and utility assistance. 747-529-3987 ? [facility]: Transportation to medical appointments. 225-277-5979 ? Social Security Administration: May apply for disability if have a Stage IV cancer. 215 036 0742 (773)468-0931 ? [employer], Disability and Transit Services: Assists with nutrition, care and transit needs. [place] Support Programs: @10RELATIVEDAYS @ > Cancer Support Group  2nd Tuesday of the month 1pm-2pm, Journey Room  > Creative Journey  3rd Tuesday of the month 1130am-1pm, Journey Room  > Look Good Feel Better  1st Wednesday of the month 10am-12 noon, Journey Room (Call [place] to register [phone])

## 2016-06-25 NOTE — Telephone Encounter (Signed)
Patient comes for labs and fluids today. Treatment nurse is going to address.

## 2016-06-25 NOTE — Progress Notes (Addendum)
Nutrition Follow-up:  Patient seen while receiving IV fluids with boyfriend and daughter at bedside.  Patient not wanting to have labs drawn or receive fluids today but has been agreeable to labs and IV fluids running while seeing her.    Patient received PEG tube on 5/22 at [facility].  Patient reports that gave 1/2 can of tube feeding on Wednesday, then gave 1/2 can 2 times on Thursday and gave 1/2 can this am before coming to the clinic.  Reports that home health RN told her to cut back on her water flush to 25ml before and after vs 62ml of water before and after.  Reports that she feels full in her stomach area and has been having to urinate a lot.  "All that water through my tube is making me have to go to the bathroom a lot."   No nausea or vomiting reported. Reports abdominal pain mostly around PEG tube insertion site.  Patient has chronic lower abdominal pain prior to PEG tube placement.   Reports last BM was Tuesday after PEG placement was loose.  Reports that she sometimes takes a stool softner to help with constipation.    Reports that yesterday she ate 1/2 and few bites of ham and egg sandwich. Ate 4 chicken nuggets from [employer] for lunch and ate 100% fish sandwich from [employer] for dinner last night.  Drank some water, sweet tea, soda, lemonade yesterday.  Total estimated calories 750 calories and 37 g of protein consumed yesterday.    Medications: reviewed  Labs: K 2.9 (supplemented), Mag and Phos WNL  Anthropometrics:   Weight 62 lb today   Estimated Energy Needs  Kcals: 366-440 calories/d (monitor with refeeding) likely will need 1340 calories/d for weight gain and repletion Protein: 28-33 g/d, likely will need 67 g/d Fluid: > 842ml  NUTRITION DIAGNOSIS: Malnutrition continues   MALNUTRITION DIAGNOSIS: Severe malnutrition continues   INTERVENTION:    Continue current tube feeding regimen of 1/2 of osmolite TID flush with 80ml before and after feeding as  patient reporting abdominal fullness.  Encouraged patient to hold syringe lower and close to belly to allow formula to infuse slower. If patient unable to tolerate may need to switch to continuous feeding via pump.  Recommend adding Mag and Phosphorus to next scheduled lab draw.   Patient agreeable to keeping food diary for RD to review regarding oral intake.     MONITORING, EVALUATION, GOAL: Patient will utilize PEG for nutrition to increase weight   NEXT VISIT: June 1  Erika [name], [place], [place] Registered Dietitian [phone] (pager)

## 2016-06-30 ENCOUNTER — Other Ambulatory Visit (HOSPITAL_COMMUNITY): Payer: Self-pay | Admitting: *Deleted

## 2016-07-02 ENCOUNTER — Ambulatory Visit (HOSPITAL_COMMUNITY): Payer: PRIVATE HEALTH INSURANCE | Admitting: Adult Health

## 2016-07-02 ENCOUNTER — Encounter (HOSPITAL_COMMUNITY): Payer: PRIVATE HEALTH INSURANCE

## 2016-07-02 ENCOUNTER — Encounter (HOSPITAL_COMMUNITY): Payer: Medicaid - Out of State | Attending: Oncology

## 2016-07-02 ENCOUNTER — Ambulatory Visit (HOSPITAL_COMMUNITY): Payer: PRIVATE HEALTH INSURANCE

## 2016-07-02 ENCOUNTER — Telehealth (HOSPITAL_COMMUNITY): Payer: Self-pay

## 2016-07-02 ENCOUNTER — Other Ambulatory Visit (HOSPITAL_COMMUNITY): Payer: Self-pay | Admitting: Adult Health

## 2016-07-02 ENCOUNTER — Telehealth (HOSPITAL_COMMUNITY): Payer: Self-pay | Admitting: Adult Health

## 2016-07-02 DIAGNOSIS — D649 Anemia, unspecified: Secondary | ICD-10-CM | POA: Diagnosis not present

## 2016-07-02 DIAGNOSIS — Z9889 Other specified postprocedural states: Secondary | ICD-10-CM | POA: Insufficient documentation

## 2016-07-02 DIAGNOSIS — C539 Malignant neoplasm of cervix uteri, unspecified: Secondary | ICD-10-CM

## 2016-07-02 DIAGNOSIS — R197 Diarrhea, unspecified: Secondary | ICD-10-CM | POA: Diagnosis not present

## 2016-07-02 DIAGNOSIS — Z789 Other specified health status: Secondary | ICD-10-CM

## 2016-07-02 DIAGNOSIS — Z825 Family history of asthma and other chronic lower respiratory diseases: Secondary | ICD-10-CM | POA: Insufficient documentation

## 2016-07-02 DIAGNOSIS — E876 Hypokalemia: Secondary | ICD-10-CM | POA: Insufficient documentation

## 2016-07-02 DIAGNOSIS — G893 Neoplasm related pain (acute) (chronic): Secondary | ICD-10-CM | POA: Insufficient documentation

## 2016-07-02 DIAGNOSIS — R600 Localized edema: Secondary | ICD-10-CM | POA: Insufficient documentation

## 2016-07-02 DIAGNOSIS — N823 Fistula of vagina to large intestine: Secondary | ICD-10-CM | POA: Diagnosis not present

## 2016-07-02 DIAGNOSIS — Z9221 Personal history of antineoplastic chemotherapy: Secondary | ICD-10-CM | POA: Diagnosis not present

## 2016-07-02 DIAGNOSIS — Z87891 Personal history of nicotine dependence: Secondary | ICD-10-CM | POA: Diagnosis not present

## 2016-07-02 DIAGNOSIS — Z79899 Other long term (current) drug therapy: Secondary | ICD-10-CM | POA: Diagnosis not present

## 2016-07-02 DIAGNOSIS — D72829 Elevated white blood cell count, unspecified: Secondary | ICD-10-CM | POA: Diagnosis not present

## 2016-07-02 DIAGNOSIS — Z931 Gastrostomy status: Secondary | ICD-10-CM

## 2016-07-02 DIAGNOSIS — Z8249 Family history of ischemic heart disease and other diseases of the circulatory system: Secondary | ICD-10-CM | POA: Diagnosis not present

## 2016-07-02 DIAGNOSIS — F419 Anxiety disorder, unspecified: Secondary | ICD-10-CM | POA: Diagnosis not present

## 2016-07-02 DIAGNOSIS — R300 Dysuria: Secondary | ICD-10-CM | POA: Insufficient documentation

## 2016-07-02 DIAGNOSIS — E875 Hyperkalemia: Secondary | ICD-10-CM

## 2016-07-02 DIAGNOSIS — E43 Unspecified severe protein-calorie malnutrition: Secondary | ICD-10-CM | POA: Diagnosis not present

## 2016-07-02 DIAGNOSIS — C787 Secondary malignant neoplasm of liver and intrahepatic bile duct: Secondary | ICD-10-CM | POA: Insufficient documentation

## 2016-07-02 DIAGNOSIS — Z923 Personal history of irradiation: Secondary | ICD-10-CM | POA: Diagnosis not present

## 2016-07-02 LAB — COMPREHENSIVE METABOLIC PANEL
ALT: 10 U/L — ABNORMAL LOW (ref 14–54)
AST: 16 U/L (ref 15–41)
Albumin: 2.3 g/dL — ABNORMAL LOW (ref 3.5–5.0)
Alkaline Phosphatase: 141 U/L — ABNORMAL HIGH (ref 38–126)
Anion gap: 8 (ref 5–15)
BUN: 7 mg/dL (ref 6–20)
CHLORIDE: 99 mmol/L — AB (ref 101–111)
CO2: 30 mmol/L (ref 22–32)
Calcium: 9.1 mg/dL (ref 8.9–10.3)
Creatinine, Ser: 0.4 mg/dL — ABNORMAL LOW (ref 0.44–1.00)
GFR calc Af Amer: >60 mL/min (ref >60)
Glucose, Bld: 122 mg/dL — ABNORMAL HIGH (ref 65–99)
POTASSIUM: 5.5 mmol/L — AB (ref 3.5–5.1)
SODIUM: 137 mmol/L (ref 135–145)
Total Bilirubin: 0.6 mg/dL (ref 0.3–1.2)
Total Protein: 7 g/dL (ref 6.5–8.1)

## 2016-07-02 LAB — CBC WITH DIFFERENTIAL/PLATELET
Basophils Absolute: 0 10*3/uL (ref 0.0–0.1)
Basophils Relative: 0 %
EOS ABS: 0 10*3/uL (ref 0.0–0.7)
Eosinophils Relative: 0 %
HEMATOCRIT: 30.6 % — AB (ref 36.0–46.0)
HEMOGLOBIN: 9.8 g/dL — AB (ref 12.0–15.0)
LYMPHS ABS: 0.8 10*3/uL (ref 0.7–4.0)
Lymphocytes Relative: 5 %
MCH: 33.8 pg (ref 26.0–34.0)
MCHC: 32 g/dL (ref 30.0–36.0)
MCV: 105.5 fL — AB (ref 78.0–100.0)
MONOS PCT: 7 %
Monocytes Absolute: 1.2 10*3/uL — ABNORMAL HIGH (ref 0.1–1.0)
NEUTROS ABS: 15.6 10*3/uL — AB (ref 1.7–7.7)
NEUTROS PCT: 88 %
Platelets: 408 10*3/uL — ABNORMAL HIGH (ref 150–400)
RBC: 2.9 MIL/uL — ABNORMAL LOW (ref 3.87–5.11)
RDW: 19.1 % — ABNORMAL HIGH (ref 11.5–15.5)
WBC: 17.7 10*3/uL — ABNORMAL HIGH (ref 4.0–10.5)

## 2016-07-02 LAB — PHOSPHORUS: PHOSPHORUS: 2.4 mg/dL — AB (ref 2.5–4.6)

## 2016-07-02 LAB — MAGNESIUM: MAGNESIUM: 1.7 mg/dL (ref 1.7–2.4)

## 2016-07-02 MED ORDER — SODIUM POLYSTYRENE SULFONATE 15 GM/60ML PO SUSP: 15.0000 g | Freq: Once | ORAL | 0 refills | Status: AC

## 2016-07-02 NOTE — Telephone Encounter (Signed)
Nutrition Follow-up:  Spoke with patient via phone this pm regarding tube feeding tolerance.  Patient reports she is doing good.  "Everything is going well."  Reports that she is taking the 1/2 can of osmolite 1.2,  3 times per day with 86ml of water before and after feeding.  Reports sometimes she if she has eaten orally before feeding she will have to give a little bit of the 1/2 can then have to wait a little bit then go back and feed some more.  Reports 2 days ago had constipation and took stool softner and then had 2 bowel movements yesterday (liquid, looks like baby poop) and 1 bowel movement today so far.   Reports that she has been eating orally.  Reports yesterday had 3 oz steak and 2 eggs, then snacked on peaches and chips, then for dinner had shredded chicken (1/4 cup) and rice (1/4 cup).  Estimated total calories ~500 calories and 28 g of protein   Medications: reviewed  Labs: K 5.5 (supplementation stopped and kaxeylate to be given, Phos 2.4, Mag WNL  Anthropometrics:   No new weight   Estimated Energy Needs  Kcals: 700-840 calories (monitor with refeeding) likely will need 1340 calories/d for weight gain and repletion Protein: 28-33 g/d, likely will need 67 g/d Fluid: > 851ml/d  NUTRITION DIAGNOSIS: Malnutrition continues   MALNUTRITION DIAGNOSIS: Severe malnutrition continues   INTERVENTION:  Recommend increasing tube feeding by 1/2  as tolerated until goal rate of 3 full cans per day given. Discussed ways for patient to reach this goal.  May need to switch to 1.5 formula vs 1.2 formula if patient unable to reach goal.  Also may need to consider pump infusion.  Discussed with patient that current water flush via tube will not meet hydration needs and patient will need to continue to consume fluids orally to stay hydrated.  Patient verbalized understanding.   Recommend rechecking labs including Mag and phos next week.    MONITORING, EVALUATION, GOAL: Patient will  utilize PEG for nutrition to increase weight   NEXT VISIT: June 8  Aizik Reh B. Zenia Resides, [place], [place] Registered Dietitian [phone] (pager)

## 2016-07-02 NOTE — Telephone Encounter (Signed)
Spoke with Erika [name] regarding her lab work completed today.    Potassium is elevated at 5.5. Instructed her to stop the potassium supplement for now. I will likely need to give her a dose of Kayexalate; explained the purpose and side effects of this medication.  One dose should be sufficient. I will have my nurse call her to give her instructions as well.   Overall, Erika [name] tells me that she feels pretty good.  Denies any new myalgias; "I have my same pain I always have, but nothing is new."  Denies fever, dysuria, or hematuria. Does tell me that some days her urine is more brown and other days it is clear and yellow. She had to reduce her water flushes d/t feeling too full with the feedings (this was recommended by home health nurse).    WBCs elevated; no obvious signs or symptoms of infection. Could be reactive given recent G-tube placement. We will keep monitoring.    Phosphorus mildly low (initial symptom of refeeding syndrome). Phos only mildly low at 2.4. Discussed with both Dr. Talbert Cage and Jennet Maduro, RD. We will continue to monitor and not replete phosphorus for now.  Our hope is it will normalize over time.   She will return for in-person follow-up visit next week with repeat labs as scheduled. Encouraged Erika [name] to call us with any new or worsening symptoms.  She agreed with this plan.    Mike Craze, NP [place] [phone]

## 2016-07-09 ENCOUNTER — Ambulatory Visit (HOSPITAL_COMMUNITY)
Admission: RE | Admit: 2016-07-09 | Discharge: 2016-07-09 | Disposition: A | Discharge status: Home / Self Care | Payer: Medicaid - Out of State | Source: Ambulatory Visit | Attending: Adult Health | Admitting: Adult Health

## 2016-07-09 ENCOUNTER — Encounter (HOSPITAL_BASED_OUTPATIENT_CLINIC_OR_DEPARTMENT_OTHER): Payer: Medicaid - Out of State

## 2016-07-09 ENCOUNTER — Encounter (HOSPITAL_BASED_OUTPATIENT_CLINIC_OR_DEPARTMENT_OTHER): Payer: Medicaid - Out of State | Admitting: Adult Health

## 2016-07-09 ENCOUNTER — Telehealth (HOSPITAL_COMMUNITY): Payer: Self-pay | Admitting: Adult Health

## 2016-07-09 ENCOUNTER — Encounter (HOSPITAL_COMMUNITY): Payer: Medicaid - Out of State

## 2016-07-09 ENCOUNTER — Encounter (HOSPITAL_COMMUNITY): Payer: Self-pay | Admitting: Adult Health

## 2016-07-09 VITALS — BP 86/57 | HR 119 | Temp 98.3°F | Resp 16 | Ht 60.0 in | Wt <= 1120 oz

## 2016-07-09 DIAGNOSIS — R319 Hematuria, unspecified: Secondary | ICD-10-CM

## 2016-07-09 DIAGNOSIS — R770 Abnormality of albumin: Secondary | ICD-10-CM

## 2016-07-09 DIAGNOSIS — R748 Abnormal levels of other serum enzymes: Secondary | ICD-10-CM

## 2016-07-09 DIAGNOSIS — E43 Unspecified severe protein-calorie malnutrition: Secondary | ICD-10-CM

## 2016-07-09 DIAGNOSIS — Z789 Other specified health status: Secondary | ICD-10-CM

## 2016-07-09 DIAGNOSIS — D649 Anemia, unspecified: Secondary | ICD-10-CM

## 2016-07-09 DIAGNOSIS — C539 Malignant neoplasm of cervix uteri, unspecified: Secondary | ICD-10-CM | POA: Diagnosis not present

## 2016-07-09 DIAGNOSIS — D72829 Elevated white blood cell count, unspecified: Secondary | ICD-10-CM | POA: Diagnosis not present

## 2016-07-09 DIAGNOSIS — C787 Secondary malignant neoplasm of liver and intrahepatic bile duct: Secondary | ICD-10-CM | POA: Diagnosis not present

## 2016-07-09 DIAGNOSIS — E876 Hypokalemia: Secondary | ICD-10-CM

## 2016-07-09 DIAGNOSIS — D473 Essential (hemorrhagic) thrombocythemia: Secondary | ICD-10-CM | POA: Diagnosis not present

## 2016-07-09 DIAGNOSIS — R634 Abnormal weight loss: Secondary | ICD-10-CM | POA: Diagnosis not present

## 2016-07-09 DIAGNOSIS — G893 Neoplasm related pain (acute) (chronic): Secondary | ICD-10-CM

## 2016-07-09 DIAGNOSIS — E639 Nutritional deficiency, unspecified: Secondary | ICD-10-CM

## 2016-07-09 DIAGNOSIS — R350 Frequency of micturition: Secondary | ICD-10-CM

## 2016-07-09 DIAGNOSIS — N390 Urinary tract infection, site not specified: Secondary | ICD-10-CM

## 2016-07-09 LAB — COMPREHENSIVE METABOLIC PANEL
ALK PHOS: 147 U/L — AB (ref 38–126)
ALT: 11 U/L — ABNORMAL LOW (ref 14–54)
AST: 17 U/L (ref 15–41)
Albumin: 2.1 g/dL — ABNORMAL LOW (ref 3.5–5.0)
Anion gap: 11 (ref 5–15)
BUN: 12 mg/dL (ref 6–20)
CALCIUM: 8.6 mg/dL — AB (ref 8.9–10.3)
CO2: 28 mmol/L (ref 22–32)
Chloride: 94 mmol/L — ABNORMAL LOW (ref 101–111)
Creatinine, Ser: 0.69 mg/dL (ref 0.44–1.00)
Glucose, Bld: 111 mg/dL — ABNORMAL HIGH (ref 65–99)
Potassium: 3.1 mmol/L — ABNORMAL LOW (ref 3.5–5.1)
SODIUM: 133 mmol/L — AB (ref 135–145)
Total Bilirubin: 0.5 mg/dL (ref 0.3–1.2)
Total Protein: 7.1 g/dL (ref 6.5–8.1)

## 2016-07-09 LAB — URINALYSIS, ROUTINE W REFLEX MICROSCOPIC
Bilirubin Urine: NEGATIVE
Glucose, UA: NEGATIVE mg/dL
Ketones, ur: NEGATIVE mg/dL
Nitrite: POSITIVE — AB
PROTEIN: 100 mg/dL — AB
SPECIFIC GRAVITY, URINE: 1.025 (ref 1.005–1.030)
pH: 5 (ref 5.0–8.0)

## 2016-07-09 LAB — CBC WITH DIFFERENTIAL/PLATELET
Basophils Absolute: 0 10*3/uL (ref 0.0–0.1)
Basophils Relative: 0 %
Eosinophils Absolute: 0 10*3/uL (ref 0.0–0.7)
Eosinophils Relative: 0 %
HCT: 27.4 % — ABNORMAL LOW (ref 36.0–46.0)
HEMOGLOBIN: 8.8 g/dL — AB (ref 12.0–15.0)
LYMPHS ABS: 1 10*3/uL (ref 0.7–4.0)
LYMPHS PCT: 5 %
MCH: 32.7 pg (ref 26.0–34.0)
MCHC: 32.1 g/dL (ref 30.0–36.0)
MCV: 101.9 fL — AB (ref 78.0–100.0)
Monocytes Absolute: 1.6 10*3/uL — ABNORMAL HIGH (ref 0.1–1.0)
Monocytes Relative: 8 %
NEUTROS PCT: 87 %
Neutro Abs: 16 10*3/uL — ABNORMAL HIGH (ref 1.7–7.7)
Platelets: 472 10*3/uL — ABNORMAL HIGH (ref 150–400)
RBC: 2.69 MIL/uL — AB (ref 3.87–5.11)
RDW: 18.9 % — ABNORMAL HIGH (ref 11.5–15.5)
WBC: 18.5 10*3/uL — AB (ref 4.0–10.5)

## 2016-07-09 LAB — PROCALCITONIN: PROCALCITONIN: 0.17 ng/mL

## 2016-07-09 LAB — PHOSPHORUS: PHOSPHORUS: 2.4 mg/dL — AB (ref 2.5–4.6)

## 2016-07-09 LAB — MAGNESIUM: Magnesium: 1.6 mg/dL — ABNORMAL LOW (ref 1.7–2.4)

## 2016-07-09 MED ORDER — SULFAMETHOXAZOLE-TRIMETHOPRIM 800-160 MG PO TABS: 1.0000 | ORAL_TABLET | Freq: Two times a day (BID) | ORAL | 0 refills | Status: DC

## 2016-07-09 MED ORDER — HEPARIN SOD (PORK) LOCK FLUSH 100 UNIT/ML IV SOLN
500.0000 [IU] | Freq: Once | INTRAVENOUS | Status: DC
  Filled 2016-07-09: qty 5

## 2016-07-09 MED ORDER — HEPARIN SOD (PORK) LOCK FLUSH 100 UNIT/ML IV SOLN
INTRAVENOUS | Status: AC
  Filled 2016-07-09: qty 5

## 2016-07-09 MED ORDER — HYDROCODONE-ACETAMINOPHEN 7.5-325 MG PO TABS: 1.0000 | ORAL_TABLET | ORAL | 0 refills | Status: DC | PRN

## 2016-07-09 MED ORDER — SODIUM CHLORIDE 0.9% FLUSH: 10.0000 mL | INTRAVENOUS | Status: DC | PRN

## 2016-07-09 MED ORDER — POTASSIUM PHOSPHATE MONOBASIC 500 MG PO TABS: 500.0000 mg | ORAL_TABLET | Freq: Three times a day (TID) | ORAL | 3 refills | Status: AC

## 2016-07-09 MED ORDER — OSMOLITE 1.5 CAL PO LIQD: ORAL | 0 refills | Status: AC

## 2016-07-09 NOTE — Progress Notes (Signed)
Erika [name] presented for labwork. Labs per MD order drawn via Peripheral Line 23 gauge needle inserted in LT AC Good blood return present. Needle removed intact. Patient tolerated procedure well.  Erika [name] presented for Portacath access and flush. Portacath located rt chest wall accessed with  H 20 needle. Good blood return present. Portacath flushed with 29ml NS. Procedure without incident. Patient tolerated procedure well.  Patient was discharged by a different nurse than nurse who accessed the port. Patient told nurse that the port had been flushed with heparin although it hadn't been.

## 2016-07-09 NOTE — Progress Notes (Signed)
Nutrition Follow-up:  Spoke with patient in clinic this am.  Boyfriend and daughter at bedside.    Patient reports that she has been taking 3 cans of the osmolite 1.2 since last Wednesday (May 30th).  She has been dividing the cans up into multiple 1/2 cans.  Yesterday tried 1 full can at a feeding and took all of it except 1 oz.  Patient continues with 62ml water flush before and after feeding.  Reports that she has some abdominal pain, tenderness near the bumper area of the tube.  NP looked at tube today.  Patient reports that she is having at least 1 bowel movement each day and if she does not she will take stool softner as needed.    Patient also eating orally.    Medications: reviewed  Labs: Na 133, K 3.1, glucose 111, Phosphorus 2.4, Mag 1.6  Anthropometrics:   Noted weight today 60 lb 6.4 oz, decreased from 62 lb on 5/25  Estimated Energy Needs  Kcals: [phone] kcals/d Protein: 49-74 g/d Fluid: >1 L/d  NUTRITION DIAGNOSIS: Malnutrition continues   MALNUTRITION DIAGNOSIS: Severe malnutrition continues   INTERVENTION:   Recommend switching to osmolite 1.5 formula 3 cans per day vs osmolite 1.2.  Provides 1066 calories, 45 g protein and 554ml free water.  Continue 61ml of water flush before and after feeding.  Patient aware she will need to also drink fluids orally to stay hydrated.    [facility] contacted regarding change in formula. Electrolytes are being supplemented by NP.    MONITORING, EVALUATION, GOAL: Patient will utilize PEG tube to for nutrition and weight gain.   NEXT VISIT: June 15th phone call  Lateefa Crosby B. Zenia Resides, [place], [place] Registered Dietitian [phone] (pager)

## 2016-07-09 NOTE — Telephone Encounter (Signed)
I called and spoke with Brad, the patient's significant other. I let him know that I is urinalysis is positive for UTI. I have described antibiotics to the patient's pharmacy in [place], [place]. I would like her to start the antibiotics as soon as possible. We will will await the results of the urine culture, with antibiotic susceptibilities, and will change antibiotic therapy if needed.  Bactrim DS 1 tab twice a day 7 days since the patient's pharmacy.  This may be the source of patient's leukocytosis, as well as her urinary frequency. We will continue to monitor for resolution of symptoms after the completion of antibiotic therapy.    Mike Craze, NP [place] [phone]

## 2016-07-09 NOTE — Progress Notes (Signed)
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 Denman George      02/18/2014 Pathology Results    Cervix, biopsy - SQUAMOUS CELL CARCINOMA.      03/07/2014 - 04/24/2014 Radiation Therapy    Dr. Sondra Come- vaginal brachytherapy, 45 Gy to pelvis with sidewall boost 9 Gy and HDR 28.5 Gy in 5 fractions      03/08/2014 - 04/03/2014 Chemotherapy    Weekly CDDP 40 mg/m2 by Dr. 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      07/12/2014 PET scan    Marked interval decrease in size and hypermetabolism of the cervical mass.  The bilateral hypermetabolic pelvic sidewall lymphadenopathy has resolved in the interval.  Areas of hypermetabolic brown fat bilaterally in the chest and abdomen.      11/05/2015 Imaging    MRI abdomen at [facility]- 7.0 x 5.3 cm are in left hepatic lobe with central 3.1 x 2.7 x 2.3 cm complex cystic area, as well as an apparent subcapsular foci      11/26/2015 Procedure    US aspiration of left hepatic lobe- yielding 13 cc of yellow purulent fluid (pain resolved and PO intake increased)      11/26/2015 Pathology Results    [id number]- highly suspicious for squamous cell carcinoma      12/09/2015 PET scan    New small hypermetabolic right internal mammary soft tissue density, suspicious for metastatic lymphadenopathy.  Increased size and number of hypermetabolic liver metastases. New mild hypermetabolic activity in porta hepatis is suspicious for lymph node metastases.  No residual hypermetabolic cervical mass identified.  Multiple uterine fibroids again seen without FDG uptake.  New diffuse rectal wall thickening with hypermetabolic activity, suspicious for proctitis which could be infectious in etiology or secondary to radiation changes ; recommend clinical correlation. Small pelvic fluid collection between the anterior rectal wall and posterior uterine wall, possibly due to abscess.      12/12/2015 Initial Diagnosis    Recurrent cervical cancer ([place])     01/02/2016 Procedure    Port placed by Dr. Ladona Horns      01/09/2016 Procedure    US biopsy      01/12/2016 Pathology Results    Diagnosis Liver, needle/core biopsy - METASTATIC SQUAMOUS CELL CARCINOMA.      01/21/2016 -  Chemotherapy    The patient had palonosetron (ALOXI) injection 0.25 mg, 0.25 mg, Intravenous,  Once, 0 of 4 cycles  bevacizumab (AVASTIN) 625 mg in sodium chloride 0.9 % 100 mL chemo infusion, 15 mg/kg = 625 mg (100 % of original dose 15 mg/kg), Intravenous,  Once, 0 of 4 cycles Dose modification: 15 mg/kg (original dose 15 mg/kg, Cycle 1, Reason: Provider Judgment)  CARBOplatin (PARAPLATIN) in sodium chloride 0.9 % 100 mL chemo infusion,  (original dose ), Intravenous,  Once, 0 of 4 cycles Dose modification:   (Cycle 1)  PACLitaxel (TAXOL) 234 mg in dextrose 5 % 250 mL chemo infusion (> 80mg /m2), 175 mg/m2 = 234 mg, Intravenous,  Once,  0 of 4 cycles  for chemotherapy treatment.        04/08/2016 PET scan    Much improved PET-CT when compared to prior study from 2017. There are 2 slightly hypermetabolic residual hepatic lesions.  Diffuse marrow activity likely due to rebound or marrow stimulating drugs.      05/24/2016 PET scan    1. Continued reduction in size and metabolism of solitary mildly hypermetabolic right liver dome metastasis. No new or progressive hypermetabolic metastatic disease. 2. 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 GYN-ONC consultation advised for  correlation with clinical history and pelvic examination. 3. Stable diffuse marrow hypermetabolism compatible with reactive/stimulated marrow state.      06/22/2016 Procedure    Placement of PEG tube for severe protein calorie malnutrition. (placed by IR at [facility]).         INTERVAL HISTORY:  Ms. Summers Buendia 41 y.o. female returns for follow-up visit.   She is here today with her significant other and her youngest daughter.   Overall, she tells me she feels like she is continuing to improve. States that she has been able to tolerate nearly the entire one can of tube feeding formula 3 times per day, with the exception of about 1 ounce per can. Her weight today is 60.4 lbs, down an additional 2 lbs in the past 2 weeks. Endorses periodic abdominal pain, particularly at the "four bumpers" of her G-tube. She is continuing to work on improving her oral intake as well. Her abdomen becomes distended after feedings, which worsens her abdominal pain.  States that she is voiding very often; endorses occasional dysuria with pelvic pressure, but none today. The pelvic pressure has been a chronic complaint for her, and states that it is largely unchanged. States that her urine is clear and light yellow; urine is no longer dark yellow or brown. She has been having to wear a "pull up diaper" due to frequent urination. Denies any fever, chills, cough or shortness of breath.  Her lower extremity edema is resolved.   She is requesting a refill on her Norco; states "it isn't due yet, but if I can get the prescription and take it to the pharmacy rather than driving back here for it, that would be helpful."     REVIEW OF SYSTEMS:  Review of Systems  Constitutional: Positive for fatigue. Negative for chills and fever.  HENT:  Negative.   Eyes: Negative.   Respiratory: Negative.  Negative for shortness of breath.   Cardiovascular: Positive for leg swelling (feet swelling ). Negative for chest pain.    Gastrointestinal: Positive for abdominal pain and constipation. Negative for vomiting.  Endocrine: Negative.   Genitourinary: Positive for frequency. Negative for dysuria, hematuria and vaginal discharge.        Pelvic pressure  Musculoskeletal: Positive for back pain.  Skin: Negative.  Negative for rash.  Neurological: Negative.   Hematological: Negative.   Psychiatric/Behavioral: The patient is nervous/anxious.      PAST MEDICAL/SURGICAL HISTORY:  Past Medical History:  Diagnosis Date  . Anxiety   . Cervical cancer ([place])   . Cervical cancer, FIGO stage IIB ([place]) 04/09/2014  . Family history of adverse reaction to anesthesia    pt states her sister has to have the "reversal" after surgery   . Hypokalemia   . Microcytic hypochromic anemia   . Recurrent cervical cancer ([place]) 12/12/2015   Past Surgical History:  Procedure Laterality Date  . CESAREAN SECTION    .  IR GASTROSTOMY TUBE MOD SED  06/22/2016  . port a cath placement      January 2016 / right   . TANDEM RING INSERTION N/A 04/09/2014   Procedure: TANDEM RING PLACEMENT FOR HIGH DOSE RATE RADIATION THERAPY EXAM UNDER ANETHESIA PLACEMENT OF CERVICAL SLEEVE;  Surgeon: Gery Pray, MD;  Location: [facility];  Service: Urology;  Laterality: N/A;  . TANDEM RING INSERTION N/A 04/23/2014   Procedure: TANDEM RING PLACEMENT ;  Surgeon: Gery Pray, MD;  Location: [facility];  Service: Urology;  Laterality: N/A;  . TANDEM RING INSERTION N/A 04/30/2014   Procedure: TANDEM RING PLACEMENT,EXAM UNDER ANESTHESIA ;  Surgeon: Gery Pray, MD;  Location: [facility];  Service: Urology;  Laterality: N/A;  . TANDEM RING INSERTION N/A 05/07/2014   Procedure: TANDEM RING PLACEMENT;  Surgeon: Gery Pray, MD;  Location: [facility];  Service: Urology;  Laterality: N/A;  . TANDEM RING INSERTION N/A 05/17/2014   Procedure: TANDEM RING INSERTION;  Surgeon: Gery Pray, MD;  Location: [facility];  Service: Urology;  Laterality: N/A;  . TUBAL LIGATION        SOCIAL HISTORY:  Social History   Social History  . Marital status: Single    Spouse name: N/A  . Number of children: 2  . Years of education: N/A   Occupational History  . cook at [employer] History Main Topics  . Smoking status: Former Smoker    Packs/day: 0.50    Years: 6.00    Types: Cigarettes    Quit date: 08/02/2011  . Smokeless tobacco: Never Used  . Alcohol use No  . Drug use: Yes    Types: Marijuana  . Sexual activity: Yes   Other Topics Concern  . Not on file   Social History Narrative  . No narrative on file    FAMILY HISTORY:  Family History  Problem Relation Age of Onset  . Asthma Mother   . Heart attack Father     CURRENT MEDICATIONS:  Outpatient Encounter Prescriptions as of 07/09/2016  Medication Sig Note  . calcium-vitamin D (OSCAL 500/200 D-3) 500-200 MG-UNIT per tablet Take 1 tablet by mouth 2 (two) times daily. 07/09/2016: Twice a week  . estrogen, conjugated,-medroxyprogesterone (PREMPRO) 0.3-1.5 MG per tablet Take 1 tablet by mouth daily. (Patient taking differently: Take 1 tablet by mouth See admin instructions. Take 1 tablet by mouth daily for 2 weeks then stop for 2 weeks)   . ferrous sulfate 325 (65 FE) MG tablet Take 325 mg by mouth daily with breakfast. 07/09/2016: Takes twice a week  . HYDROcodone-acetaminophen (NORCO) 7.5-325 MG tablet Take 1 tablet by mouth every 4 (four) hours as needed for moderate pain.   Marland Kitchen lidocaine-prilocaine (EMLA) cream Apply to affected area once   . LORazepam (ATIVAN) 1 MG tablet TAKE 1 TABLET BY MOUTH AT BEDTIME AS NEEDED FOR ANXIETY /INSOMNIA   . naproxen sodium (ANAPROX) 220 MG tablet Take 220 mg by mouth 4 (four) times daily as needed (for pain).    . ondansetron (ZOFRAN) 8 MG tablet Take 1 tablet (8 mg total) by mouth 2 (two) times daily as needed for refractory nausea / vomiting. Start on day 3 after chemo. (Patient not taking: Reported on 07/09/2016)   . pantoprazole (PROTONIX) 40 MG tablet Take 1  tablet (40 mg total) by mouth daily.   . potassium phosphate, monobasic, (K-PHOS ORIGINAL) 500 MG tablet Take 1 tablet (500 mg total) by mouth 3 (three) times daily with meals.   Marland Kitchen  predniSONE (DELTASONE) 20 MG tablet Take 1 tablet (20 mg total) by mouth daily with breakfast.   . prochlorperazine (COMPAZINE) 10 MG tablet Take 1 tablet (10 mg total) by mouth every 6 (six) hours as needed (Nausea or vomiting).   . [DISCONTINUED] HYDROcodone-acetaminophen (NORCO) 7.5-325 MG tablet Take 1 tablet by mouth every 4 (four) hours as needed for moderate pain.   . [DISCONTINUED] Nutritional Supplements (FEEDING SUPPLEMENT, OSMOLITE 1.2 CAL,) LIQD Give 1 can 3 times per day.  Flush with 32m of water before and after feeding TID.   Begin with 1/2 can 3 times per day.   Send bolus tube feeding supplies.   . [DISCONTINUED] Potassium Chloride 40 MEQ/15ML (20%) SOLN Take 40 mEq by mouth 2 (two) times daily. (Patient not taking: Reported on 07/09/2016)    No facility-administered encounter medications on file as of 07/09/2016.     ALLERGIES:  No Known Allergies   PHYSICAL EXAM:  ECOG Performance status: 2 - Symptomatic; requires occasional assistance.    Vitals:   07/09/16 0938  BP: (!) 86/57  Pulse: (!) 119  Resp: 16  Temp: 98.3 F (36.8 C)    Filed Weights   07/09/16 0938  Weight: 60 lb 6.4 oz (27.4 kg)      Physical Exam  Constitutional: She is oriented to person, place, and time.  Emaciated/cachectic female in no acute distress   HENT:  Head: Normocephalic.  Mouth/Throat: Oropharynx is clear and moist. No oropharyngeal exudate.  Eyes: Conjunctivae are normal. Pupils are equal, round, and reactive to light. No scleral icterus.  Neck: Normal range of motion. Neck supple.  Cardiovascular: Regular rhythm.   Tachycardic  Pulmonary/Chest: Effort normal. No respiratory distress. She has no wheezes. She has no rales.  Diminished breath sounds bilat bases   Abdominal: Soft. Bowel sounds are  normal. There is no tenderness (no pain on palpation to abd).  G-tube in place; No drainage or leaking at insertion site. No evidence of infection (no erythema, induration, or drainage). PEG bumpers are flush to skin, ? Slightly distended abd. Tenderness/pain noted if trying to manipulate PEG tube.   Musculoskeletal: Normal range of motion. She exhibits no edema (BLE edema resolved).  Lymphadenopathy:    She has no cervical adenopathy.  Neurological: She is alert and oriented to person, place, and time. No cranial nerve deficit. Gait normal.  Skin: Skin is warm and dry. No rash noted.  Psychiatric: Memory and judgment normal.  Mildly flat affect; appears anxious at times. Is also tearful at times during visit.   Nursing note and vitals reviewed.    LABORATORY DATA:  I have reviewed the labs as listed.  CBC    Component Value Date/Time   WBC 18.5 (H) 07/09/2016 1015   RBC 2.69 (L) 07/09/2016 1015   HGB 8.8 (L) 07/09/2016 1015   HGB 12.0 12/12/2015 0920   HCT 27.4 (L) 07/09/2016 1015   HCT 37.6 12/12/2015 0920   PLT 472 (H) 07/09/2016 1015   PLT 289 12/12/2015 0920   MCV 101.9 (H) 07/09/2016 1015   MCV 89.1 12/12/2015 0920   MCH 32.7 07/09/2016 1015   MCHC 32.1 07/09/2016 1015   RDW 18.9 (H) 07/09/2016 1015   RDW 15.5 (H) 12/12/2015 0920   LYMPHSABS 1.0 07/09/2016 1015   LYMPHSABS 1.0 12/12/2015 0920   MONOABS 1.6 (H) 07/09/2016 1015   MONOABS 0.3 12/12/2015 0920   EOSABS 0.0 07/09/2016 1015   EOSABS 0.0 12/12/2015 0920   BASOSABS 0.0 07/09/2016 1015  BASOSABS 0.0 12/12/2015 0920   CMP Latest Ref Rng & Units 07/09/2016 07/02/2016 06/25/2016  Glucose 65 - 99 mg/dL 111(H) 122(H) 99  BUN 6 - 20 mg/dL 12 7 9   Creatinine 0.44 - 1.00 mg/dL 0.69 0.40(L) 0.46  Sodium 135 - 145 mmol/L 133(L) 137 136  Potassium 3.5 - 5.1 mmol/L 3.1(L) 5.5(H) 2.9(L)  Chloride 101 - 111 mmol/L 94(L) 99(L) 96(L)  CO2 22 - 32 mmol/L 28 30 31   Calcium 8.9 - 10.3 mg/dL 8.6(L) 9.1 8.4(L)  Total Protein 6.5  - 8.1 g/dL 7.1 7.0 6.4(L)  Total Bilirubin 0.3 - 1.2 mg/dL 0.5 0.6 0.3  Alkaline Phos 38 - 126 U/L 147(H) 141(H) 123  AST 15 - 41 U/L 17 16 15   ALT 14 - 54 U/L 11(L) 10(L) 8(L)    Results for RAFFAELLA, EDISON (MRN [id number]) as of 07/09/2016 12:46  Ref. Range 07/09/2016 10:15  Phosphorus Latest Ref Range: 2.5 - 4.6 mg/dL 2.4 (L)  Magnesium Latest Ref Range: 1.7 - 2.4 mg/dL 1.6 (L)     PENDING LABS:     DIAGNOSTIC IMAGING:  *The following radiologic images and reports have been reviewed independently and agree with below findings.  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 Most recent restaging PET scan 05/24/16 revealed continued reduction in size and metabolism of right liver mets; no new/progressive metastatic disease. 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  Patient saw Dr. Denman George with Gyn-Onc on 05/24/16; she recommends no additional chemotherapy at this time given patient's poor tolerance and weight loss.  -Serial tumor markers have been largely stable.  -Follow-up with Dr. Denman George in 08/2016 with subsequent restaging PET scan.  -Follow-up at cancer center in about 10 days for continued supportive care.   Severe protein calorie malnutrition:  -Weight decreased further today to 60.4 lbs (down 4 lbs in 1 month). She has been reportedly tolerating nearly 1 can of tube feeding formula TID, with water flushes, as prescribed. She is also reportedly eating some and drinking by mouth as well.  -Albumin remains low at 2.1 today. Alkaline phosphatase elevated, possibly d/t malnutrition & refeeding.  -She was also seen today by Jennet Maduro, RD. We will increase her tube feeding formula to higher protein/calories; Joli to contact [facility] to deliver appropriate  feeding formula to patient's home.  -She remains at increased risk of refeeding syndrome, particularly in the setting of increased protein and calories in the new tube feeding formula.  She does have mild hypophosphatemia & hypokalemia today (see below).  -Return in 10 days for follow-up and visit with dietitian.  Hypophosphatemia/Hypokalemia/Hypomagnesiemia:  -Phosphorus mildly low at 2.4 today. Her potassium 1 week ago was actually elevated at 5.5. She was given 1 dose of Kayexalate with good response. Today, her potassium is low at 3.1 today.  -E-scribed K-Phos 500 mg po/VT TID for patient.   -Magnesium is only mildly low at 1.6 and could be secondary to hypokalemia. No intervention at this time given patient's reported diarrhea with tube feedings (mag supplementation may worsen diarrhea and I do not want to risk added risk for dehydration/electrolyte imbalance).  -Will recheck her electrolytes when she returns to cancer center in 1.5 weeks.   Leukocytosis:  -Persistent elevated  WBCs x 2 weeks. WBCs today 18.5. -Discussed with Dr. Irene Limbo.  -Given her urinary frequency and occasional dysuria/pelvic pressure, will obtain urinalysis/urine culture today. G-tube site does not appear infected.  Will also collect procalcitonin level, CXR, and KUB to ensure no source of possible infection.    Addendum:  -CXR and KUB negative/normal. Procalcitonin normal. Urinalysis pending.   Anemia/Thrombocytosis:  -Largely stable. Hgb 8.8 g/dL today. Platelets 472,000; elevated platelets likely reactive.  -No reported bleeding episodes. No need for transfusion. Will continue to monitor.   Bilateral pedal edema:  -Resolved.   Neoplasm related pain:  -Continue Norco as previously prescribed.  -[place] Controlled Substance Registry reviewed; refill appropriate and paper prescription given to patient today.        Adequate bowel regimen:  -Particularly in the setting of chronic opiate use.  -Her bowels  have been moving regularly since starting tube feedings. Recommend continued bowel regimen with stool softeners/Miralax, as needed.         Dispo:  -CXR and KUB today. Will add on procalcitonin to help rule out systemic infection.  -Return to cancer center on 06/19/16 for labs, follow-up visit, and to see dietitian.    All questions were answered to patient's stated satisfaction. Encouraged patient to call with any new concerns or questions before her next visit to the cancer center and we can certain see her sooner, if needed.    Plan of care discussed with Dr. Irene Limbo, who agrees with the above aforementioned.     Orders placed this encounter:  Orders Placed This Encounter  Procedures  . Urine culture  . DG Chest 2 View  . DG Abd 1 View  . CBC with Differential/Platelet  . Comprehensive metabolic panel  . Magnesium  . Phosphorus  . Urinalysis, Routine w reflex microscopic  . Procalcitonin      Mike Craze, NP [place] [phone]

## 2016-07-09 NOTE — Patient Instructions (Addendum)
[place] at [facility] Discharge Instructions  RECOMMENDATIONS MADE BY THE CONSULTANT AND ANY TEST RESULTS WILL BE SENT TO YOUR REFERRING PHYSICIAN.  You were seen today by Mike Craze NP. Urine done today will call you with results. Potassium sent to your pharmacy, take one tablet three times a day with feeding. Chest and abdominal xray today on your way out. Return in 2 weeks for follow up labs and port flush.     Thank you for choosing [place] at [facility] to provide your oncology and hematology care.  To afford each patient quality time with our provider, please arrive at least 15 minutes before your scheduled appointment time.    If you have a lab appointment with the [place] please come in thru the  Main Entrance and check in at the main information desk  You need to re-schedule your appointment should you arrive 10 or more minutes late.  We strive to give you quality time with our providers, and arriving late affects you and other patients whose appointments are after yours.  Also, if you no show three or more times for appointments you may be dismissed from the clinic at the providers discretion.     Again, thank you for choosing [facility].  Our hope is that these requests will decrease the amount of time that you wait before being seen by our physicians.       _____________________________________________________________  Should you have questions after your visit to [facility], please contact our office at [phone] between the hours of 8:30 a.m. and 4:30 p.m.  Voicemails left after 4:30 p.m. will not be returned until the following business day.  For prescription refill requests, have your pharmacy contact our office.       Resources For Cancer Patients and their Caregivers ? American Cancer Society: Can assist with transportation, wigs, general needs, runs Look Good Feel  Better.        (847)647-9675 ? Cancer Care: Provides financial assistance, online support groups, medication/co-pay assistance.  1-800-813-HOPE 256-362-3832) ? [place] Assists [place] cancer patients and their families through emotional , educational and financial support.  825 448 9371 ? [place] DSS Where to apply for food stamps, Medicaid and utility assistance. (979) 400-4191 ? [facility]: Transportation to medical appointments. 346-753-8763 ? Social Security Administration: May apply for disability if have a Stage IV cancer. 418-131-3278 510 295 7075 ? [employer], Disability and Transit Services: Assists with nutrition, care and transit needs. [place] Support Programs: @10RELATIVEDAYS @ > Cancer Support Group  2nd Tuesday of the month 1pm-2pm, Journey Room  > Creative Journey  3rd Tuesday of the month 1130am-1pm, Journey Room  > Look Good Feel Better  1st Wednesday of the month 10am-12 noon, Journey Room (Call Katie to register [phone])

## 2016-07-09 NOTE — Addendum Note (Signed)
Addended by: Mike Craze on: 07/09/2016 05:20 PM   Modules accepted: Orders

## 2016-07-11 LAB — URINE CULTURE

## 2016-07-16 ENCOUNTER — Telehealth (HOSPITAL_COMMUNITY): Payer: Self-pay

## 2016-07-16 NOTE — Telephone Encounter (Signed)
Nutrition  Called patient times 3 today (9:15am, 2:45pm and 3pm) and have not been able to reach patient or leave a message.  Patient scheduled to see RD on Tuesday, June 26.  Edgard Debord B. Zenia Resides, [place], [place] Registered Dietitian [phone] (pager)

## 2016-07-19 NOTE — Progress Notes (Signed)
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 Denman George      02/18/2014 Pathology Results    Cervix, biopsy - SQUAMOUS CELL CARCINOMA.      03/07/2014 - 04/24/2014 Radiation Therapy    Dr. Sondra Come- vaginal brachytherapy, 45 Gy to pelvis with sidewall boost 9 Gy and HDR 28.5 Gy in 5 fractions      03/08/2014 - 04/03/2014 Chemotherapy    Weekly CDDP 40 mg/m2 by Dr. 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      07/12/2014 PET scan    Marked interval decrease in size and hypermetabolism of the cervical mass.  The bilateral hypermetabolic pelvic sidewall lymphadenopathy has resolved in the interval.  Areas of hypermetabolic brown fat bilaterally in the chest and abdomen.      11/05/2015 Imaging    MRI abdomen at [facility]- 7.0 x 5.3 cm are in left hepatic lobe with central 3.1 x 2.7 x 2.3 cm complex cystic area, as well as an apparent subcapsular foci      11/26/2015 Procedure    US aspiration of left hepatic lobe- yielding 13 cc of yellow purulent fluid (pain resolved and PO intake increased)      11/26/2015 Pathology Results    [id number]- highly suspicious for squamous cell carcinoma      12/09/2015 PET scan    New small hypermetabolic right internal mammary soft tissue density, suspicious for metastatic lymphadenopathy.  Increased size and number of hypermetabolic liver metastases. New mild hypermetabolic activity in porta hepatis is suspicious for lymph node metastases.  No residual hypermetabolic cervical mass identified.  Multiple uterine fibroids again seen without FDG uptake.  New diffuse rectal wall thickening with hypermetabolic activity, suspicious for proctitis which could be infectious in etiology or secondary to radiation changes ; recommend clinical correlation. Small pelvic fluid collection between the anterior rectal wall and posterior uterine wall, possibly due to abscess.      12/12/2015 Initial Diagnosis    Recurrent cervical cancer ([place])     01/02/2016 Procedure    Port placed by Dr. Ladona Horns      01/09/2016 Procedure    US biopsy      01/12/2016 Pathology Results    Diagnosis Liver, needle/core biopsy - METASTATIC SQUAMOUS CELL CARCINOMA.      01/21/2016 -  Chemotherapy    The patient had palonosetron (ALOXI) injection 0.25 mg, 0.25 mg, Intravenous,  Once, 0 of 4 cycles  bevacizumab (AVASTIN) 625 mg in sodium chloride 0.9 % 100 mL chemo infusion, 15 mg/kg = 625 mg (100 % of original dose 15 mg/kg), Intravenous,  Once, 0 of 4 cycles Dose modification: 15 mg/kg (original dose 15 mg/kg, Cycle 1, Reason: Provider Judgment)  CARBOplatin (PARAPLATIN) in sodium chloride 0.9 % 100 mL chemo infusion,  (original dose ), Intravenous,  Once, 0 of 4 cycles Dose modification:   (Cycle 1)  PACLitaxel (TAXOL) 234 mg in dextrose 5 % 250 mL chemo infusion (> 80mg /m2), 175 mg/m2 = 234 mg, Intravenous,  Once,  0 of 4 cycles  for chemotherapy treatment.        04/08/2016 PET scan    Much improved PET-CT when compared to prior study from 2017. There are 2 slightly hypermetabolic residual hepatic lesions.  Diffuse marrow activity likely due to rebound or marrow stimulating drugs.      05/24/2016 PET scan    1. Continued reduction in size and metabolism of solitary mildly hypermetabolic right liver dome metastasis. No new or progressive hypermetabolic metastatic disease. 2. 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 GYN-ONC consultation advised for  correlation with clinical history and pelvic examination. 3. Stable diffuse marrow hypermetabolism compatible with reactive/stimulated marrow state.      06/22/2016 Procedure    Placement of PEG tube for severe protein calorie malnutrition. (placed by IR at [facility]).         INTERVAL HISTORY:  Erika [name] 41 y.o. female returns for follow-up visit.   She is here today with her significant other and her youngest daughter.   Overall, she tells me "I feel good. I am so mad that my weight won't come up."  Endorses that she is using her PEG tube appropriately, as well as eating full meals.  Estimated caloric intake ~2000 cals per dietitian (patient seen by dietitian today as well).   Her pain remains well controlled. Her urinary symptoms are improving; she will complete antibiotics for UTI in 2 days.  She still feels tired, "but I feel like doing much more than I used to."  She has been going out to eat with her family recently, which is new.  She has intermittent leg/feet swelling, "that comes and goes."  She has continued peripheral neuropathy, which is no worse.    Her weight today is 58.1 lbs; down 2 lbs in the past 2.5 weeks.     REVIEW OF SYSTEMS:  Review of Systems  Constitutional: Positive for fatigue. Negative for chills and fever.  HENT:  Negative.  Negative for lump/mass and nosebleeds.   Eyes: Negative.   Respiratory: Negative.  Negative for cough and shortness of breath.   Cardiovascular: Positive for leg swelling. Negative for chest pain.  Gastrointestinal: Negative.  Negative for abdominal pain, blood in stool, constipation, diarrhea, nausea and vomiting.  Endocrine: Negative.   Genitourinary: Positive for bladder incontinence and frequency. Negative for dysuria and hematuria.        Pelvic pressure   Musculoskeletal: Positive for arthralgias.  Skin: Negative.  Negative for rash.  Neurological: Positive for extremity weakness and numbness. Negative for dizziness  and headaches.  Hematological: Negative.  Negative for adenopathy. Does not bruise/bleed easily.  Psychiatric/Behavioral: Negative.  Negative for depression and sleep disturbance. The patient is not nervous/anxious.      PAST MEDICAL/SURGICAL HISTORY:  Past Medical History:  Diagnosis Date  . Anxiety   . Cervical cancer ([place])   . Cervical cancer, FIGO stage IIB ([place]) 04/09/2014  . Family history of adverse reaction to anesthesia    pt states her sister has to have the "reversal" after surgery   . Hypokalemia   . Microcytic hypochromic anemia   . Recurrent cervical cancer ([place]) 12/12/2015   Past Surgical History:  Procedure Laterality Date  . CESAREAN SECTION    . IR GASTROSTOMY TUBE MOD SED  06/22/2016  . port a cath placement      January 2016 / right   . TANDEM RING INSERTION N/A 04/09/2014   Procedure: TANDEM RING PLACEMENT FOR HIGH  DOSE RATE RADIATION THERAPY EXAM UNDER ANETHESIA PLACEMENT OF CERVICAL SLEEVE;  Surgeon: Gery Pray, MD;  Location: [facility];  Service: Urology;  Laterality: N/A;  . TANDEM RING INSERTION N/A 04/23/2014   Procedure: TANDEM RING PLACEMENT ;  Surgeon: Gery Pray, MD;  Location: [facility];  Service: Urology;  Laterality: N/A;  . TANDEM RING INSERTION N/A 04/30/2014   Procedure: TANDEM RING PLACEMENT,EXAM UNDER ANESTHESIA ;  Surgeon: Gery Pray, MD;  Location: [facility];  Service: Urology;  Laterality: N/A;  . TANDEM RING INSERTION N/A 05/07/2014   Procedure: TANDEM RING PLACEMENT;  Surgeon: Gery Pray, MD;  Location: [facility];  Service: Urology;  Laterality: N/A;  . TANDEM RING INSERTION N/A 05/17/2014   Procedure: TANDEM RING INSERTION;  Surgeon: Gery Pray, MD;  Location: [facility];  Service: Urology;  Laterality: N/A;  . TUBAL LIGATION       SOCIAL HISTORY:  Social History   Social History  . Marital status: Single    Spouse name: N/A  . Number of children: 2  . Years of education: N/A   Occupational History  . cook at [employer] History Main Topics  . Smoking status: Former Smoker    Packs/day: 0.50    Years: 6.00    Types: Cigarettes    Quit date: 08/02/2011  . Smokeless tobacco: Never Used  . Alcohol use No  . Drug use: Yes    Types: Marijuana  . Sexual activity: Yes   Other Topics Concern  . Not on file   Social History Narrative  . No narrative on file    FAMILY HISTORY:  Family History  Problem Relation Age of Onset  . Asthma Mother   . Heart attack Father     CURRENT MEDICATIONS:  Outpatient Encounter Prescriptions as of 07/20/2016  Medication Sig Note  . calcium-vitamin D (OSCAL 500/200 D-3) 500-200 MG-UNIT per tablet Take 1 tablet by mouth 2 (two) times daily. 07/09/2016: Twice a week  . estrogen, conjugated,-medroxyprogesterone (PREMPRO) 0.3-1.5 MG per tablet Take 1 tablet by mouth daily. (Patient taking differently: Take 1 tablet by mouth See admin instructions. Take 1 tablet by mouth daily for 2 weeks then stop for 2 weeks)   . ferrous sulfate 325 (65 FE) MG tablet Take 325 mg by mouth daily with breakfast. 07/09/2016: Takes twice a week  . HYDROcodone-acetaminophen (NORCO) 7.5-325 MG tablet Take 1 tablet by mouth every 4 (four) hours as needed for moderate pain.   Marland Kitchen lidocaine-prilocaine (EMLA) cream Apply to affected area once   . LORazepam (ATIVAN) 1 MG tablet TAKE 1 TABLET BY MOUTH AT BEDTIME AS NEEDED FOR ANXIETY /INSOMNIA   . naproxen sodium (ANAPROX) 220 MG tablet Take 220 mg by mouth 4 (four) times daily as needed (for pain).    . Nutritional Supplements (FEEDING SUPPLEMENT, OSMOLITE 1.5 CAL,) LIQD Give 1 can 3 times per day.  Flush with 34ml of water before and after feeding 3 times per day.   . ondansetron (ZOFRAN) 8 MG tablet Take 1 tablet (8 mg total) by mouth 2 (two) times daily as needed for refractory nausea / vomiting. Start on day 3 after chemo.   . pantoprazole (PROTONIX) 40 MG tablet Take 1 tablet (40 mg total) by mouth daily.   . potassium phosphate, monobasic, (K-PHOS  ORIGINAL) 500 MG tablet Take 1 tablet (500 mg total) by mouth 3 (three) times daily with meals.   . predniSONE (DELTASONE) 20 MG tablet Take 1 tablet (20  mg total) by mouth daily with breakfast.   . prochlorperazine (COMPAZINE) 10 MG tablet Take 1 tablet (10 mg total) by mouth every 6 (six) hours as needed (Nausea or vomiting).   Marland Kitchen sulfamethoxazole-trimethoprim (BACTRIM DS,SEPTRA DS) 800-160 MG tablet Take 1 tablet by mouth 2 (two) times daily.   . [EXPIRED] heparin lock flush 100 unit/mL    . [DISCONTINUED] sodium chloride flush (NS) 0.9 % injection 10 mL     No facility-administered encounter medications on file as of 07/20/2016.     ALLERGIES:  No Known Allergies   PHYSICAL EXAM:  ECOG Performance status: 2 - Symptomatic; requires occasional assistance.    BP: 96/59 HR: 119 Resp: 19 Temp: 98.4 O2 sat: 100%  Weight: 58.1 lbs    Physical Exam  Constitutional: She is oriented to person, place, and time.  Cachetic/emaciated female in no acute distress  HENT:  Head: Normocephalic.  Mouth/Throat: Oropharynx is clear and moist. No oropharyngeal exudate.  Eyes: Conjunctivae are normal. Pupils are equal, round, and reactive to light. No scleral icterus.  Neck: Normal range of motion. Neck supple.  Cardiovascular: Regular rhythm.   Tachycardic   Pulmonary/Chest: Effort normal and breath sounds normal.  Abdominal: Soft. Bowel sounds are normal. There is no tenderness. There is no rebound and no guarding.  G-tube in place   Musculoskeletal: Normal range of motion. She exhibits edema (Mild bilateral edema to toes and feet ).  Lymphadenopathy:    She has no cervical adenopathy.  Neurological: She is alert and oriented to person, place, and time. No cranial nerve deficit. Gait normal.  Skin: Skin is warm and dry. No rash noted.  Psychiatric: Mood, memory, affect and judgment normal.  Nursing note and vitals reviewed.    LABORATORY DATA:  I have reviewed the labs as listed.   CBC    Component Value Date/Time   WBC 9.6 07/20/2016 0925   RBC 2.36 (L) 07/20/2016 0925   HGB 7.5 (L) 07/20/2016 0925   HGB 12.0 12/12/2015 0920   HCT 23.4 (L) 07/20/2016 0925   HCT 37.6 12/12/2015 0920   PLT 460 (H) 07/20/2016 0925   PLT 289 12/12/2015 0920   MCV 99.2 07/20/2016 0925   MCV 89.1 12/12/2015 0920   MCH 31.8 07/20/2016 0925   MCHC 32.1 07/20/2016 0925   RDW 19.0 (H) 07/20/2016 0925   RDW 15.5 (H) 12/12/2015 0920   LYMPHSABS 1.1 07/20/2016 0925   LYMPHSABS 1.0 12/12/2015 0920   MONOABS 0.8 07/20/2016 0925   MONOABS 0.3 12/12/2015 0920   EOSABS 0.0 07/20/2016 0925   EOSABS 0.0 12/12/2015 0920   BASOSABS 0.0 07/20/2016 0925   BASOSABS 0.0 12/12/2015 0920   CMP Latest Ref Rng & Units 07/20/2016 07/09/2016 07/02/2016  Glucose 65 - 99 mg/dL 99 111(H) 122(H)  BUN 6 - 20 mg/dL 12 12 7   Creatinine 0.44 - 1.00 mg/dL 0.70 0.69 0.40(L)  Sodium 135 - 145 mmol/L 132(L) 133(L) 137  Potassium 3.5 - 5.1 mmol/L 3.9 3.1(L) 5.5(H)  Chloride 101 - 111 mmol/L 97(L) 94(L) 99(L)  CO2 22 - 32 mmol/L 27 28 30   Calcium 8.9 - 10.3 mg/dL 8.4(L) 8.6(L) 9.1  Total Protein 6.5 - 8.1 g/dL 6.7 7.1 7.0  Total Bilirubin 0.3 - 1.2 mg/dL 0.4 0.5 0.6  Alkaline Phos 38 - 126 U/L 121 147(H) 141(H)  AST 15 - 41 U/L 14(L) 17 16  ALT 14 - 54 U/L 11(L) 11(L) 10(L)    Results for MONICKA, CYRAN (MRN [id number]) as of 07/20/2016  16:58  Ref. Range 07/20/2016 09:25  Phosphorus Latest Ref Range: 2.5 - 4.6 mg/dL 2.5  Magnesium Latest Ref Range: 1.7 - 2.4 mg/dL 1.7      PENDING LABS:     DIAGNOSTIC IMAGING:  *The following radiologic images and reports have been reviewed independently and agree with below findings.  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 Most recent  restaging PET scan 05/24/16 revealed continued reduction in size and metabolism of right liver mets; no new/progressive metastatic disease. 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  Patient saw Dr. Denman George with Gyn-Onc on 05/24/16; she recommends no additional chemotherapy at this time given patient's poor tolerance and weight loss.  -Serial tumor markers have been largely stable.  -Follow-up with Dr. Denman George in 08/2016 with subsequent restaging PET scan. I will reach out to Dr. Denman George, as I do not see that PET scan or follow-up visits are scheduled.  -Follow-up in ~6 weeks.   Severe protein calorie malnutrition:  -Weight decreased further today to 58.1 lbs (down 4 lbs in 1 month). She is tolerating tube feedings a bit better. When she feels full, she stops and goes back later to finish the can. She is taking in 3 whole cans per day, plus eating by mouth as well.   -Albumin remains low at 2.1 today. Alkaline phosphatase has normalized, which is good.  -She was seen today in conjunction with Burtis Junes, RD (please see his note for additional documentation as well).   -Continue follow-up with dietitian as directed.   Hypophosphatemia/Hypokalemia/Hypomagnesiemia:  -Resolved.   -Continue supplementation as directed. I am encouraged that her electrolytes have improved so well in recent weeks.    Leukocytosis/UTI:  -Resolved. Likely secondary to UTI (currently completing 7-day course of Bactrim DS).  -WBCs normal today. Her urinary symptoms are improving; she continues to have some frequency, but pelvic pressure/pain is decreased.  Shared with her that her urine culture did not grow any specific bacteria, so if symptoms persist after completing antibiotics, then she may need longer course.    Anemia/Thrombocytosis:  -Hgb lower today at 7.5 g/dL. No signs/symptoms of active bleeding.  -Will make arrangements for 2 units PRBCs Thurs., 07/22/16 (as patient does not wish to stay today for transfusion).  This is not unreasonable and orders were signed & held today for Thursday.  -Platelets remain slightly elevated at 460,000, but improved from previous. Will keep monitoring.   Bilateral pedal edema:  -Minimal; keep legs elevated as tolerated.   Neoplasm related pain:  -Continue Norco as previously prescribed. No refills needed today.   Adequate bowel regimen:  -Bowels are moving well. She knows the importance of adequate bowel regimen while taking pain medications. Continue laxatives/stool softeners as needed.         Dispo:  -2 units PRBCs on Thursday, 07/22/16.  -Continue follow-up with dietitian as directed.  -Restaging PET scan and follow-up with Dr. Denman George sometime in July (I will reach out to her re: these plans).  -Return to cancer center in about 6 weeks for continued follow-up.    All questions were answered to patient's stated satisfaction. Encouraged patient to call with any new concerns or questions before her next visit to the cancer center  and we can certain see her sooner, if needed.    Plan of care discussed with Dr. Talbert Cage, who agrees with the above aforementioned.     Orders placed this encounter:  No orders of the defined types were placed in this encounter.     Mike Craze, NP [place] [phone]

## 2016-07-20 ENCOUNTER — Encounter (HOSPITAL_BASED_OUTPATIENT_CLINIC_OR_DEPARTMENT_OTHER): Payer: Medicaid - Out of State | Admitting: Adult Health

## 2016-07-20 ENCOUNTER — Encounter: Payer: Self-pay | Admitting: *Deleted

## 2016-07-20 ENCOUNTER — Encounter (HOSPITAL_COMMUNITY): Payer: Self-pay | Admitting: Adult Health

## 2016-07-20 ENCOUNTER — Encounter (HOSPITAL_COMMUNITY): Payer: Medicaid - Out of State | Admitting: Dietician

## 2016-07-20 ENCOUNTER — Encounter (HOSPITAL_BASED_OUTPATIENT_CLINIC_OR_DEPARTMENT_OTHER): Payer: Medicaid - Out of State

## 2016-07-20 VITALS — BP 96/59 | HR 119 | Temp 98.4°F | Resp 19 | Wt <= 1120 oz

## 2016-07-20 DIAGNOSIS — D649 Anemia, unspecified: Secondary | ICD-10-CM

## 2016-07-20 DIAGNOSIS — Z931 Gastrostomy status: Secondary | ICD-10-CM

## 2016-07-20 DIAGNOSIS — C787 Secondary malignant neoplasm of liver and intrahepatic bile duct: Secondary | ICD-10-CM

## 2016-07-20 DIAGNOSIS — C539 Malignant neoplasm of cervix uteri, unspecified: Secondary | ICD-10-CM

## 2016-07-20 DIAGNOSIS — D473 Essential (hemorrhagic) thrombocythemia: Secondary | ICD-10-CM

## 2016-07-20 LAB — COMPREHENSIVE METABOLIC PANEL
ALBUMIN: 2.1 g/dL — AB (ref 3.5–5.0)
ALK PHOS: 121 U/L (ref 38–126)
ALT: 11 U/L — ABNORMAL LOW (ref 14–54)
AST: 14 U/L — AB (ref 15–41)
Anion gap: 8 (ref 5–15)
BILIRUBIN TOTAL: 0.4 mg/dL (ref 0.3–1.2)
BUN: 12 mg/dL (ref 6–20)
CALCIUM: 8.4 mg/dL — AB (ref 8.9–10.3)
CO2: 27 mmol/L (ref 22–32)
CREATININE: 0.7 mg/dL (ref 0.44–1.00)
Chloride: 97 mmol/L — ABNORMAL LOW (ref 101–111)
GFR calc Af Amer: >60 mL/min (ref >60)
GLUCOSE: 99 mg/dL (ref 65–99)
POTASSIUM: 3.9 mmol/L (ref 3.5–5.1)
Sodium: 132 mmol/L — ABNORMAL LOW (ref 135–145)
TOTAL PROTEIN: 6.7 g/dL (ref 6.5–8.1)

## 2016-07-20 LAB — CBC WITH DIFFERENTIAL/PLATELET
BASOS ABS: 0 10*3/uL (ref 0.0–0.1)
BASOS PCT: 0 %
EOS ABS: 0 10*3/uL (ref 0.0–0.7)
Eosinophils Relative: 0 %
HCT: 23.4 % — ABNORMAL LOW (ref 36.0–46.0)
Hemoglobin: 7.5 g/dL — ABNORMAL LOW (ref 12.0–15.0)
Lymphocytes Relative: 11 %
Lymphs Abs: 1.1 10*3/uL (ref 0.7–4.0)
MCH: 31.8 pg (ref 26.0–34.0)
MCHC: 32.1 g/dL (ref 30.0–36.0)
MCV: 99.2 fL (ref 78.0–100.0)
MONO ABS: 0.8 10*3/uL (ref 0.1–1.0)
Monocytes Relative: 8 %
Neutro Abs: 7.8 10*3/uL — ABNORMAL HIGH (ref 1.7–7.7)
Neutrophils Relative %: 81 %
PLATELETS: 460 10*3/uL — AB (ref 150–400)
RBC: 2.36 MIL/uL — AB (ref 3.87–5.11)
RDW: 19 % — AB (ref 11.5–15.5)
WBC: 9.6 10*3/uL (ref 4.0–10.5)

## 2016-07-20 LAB — MAGNESIUM: Magnesium: 1.7 mg/dL (ref 1.7–2.4)

## 2016-07-20 LAB — PHOSPHORUS: Phosphorus: 2.5 mg/dL (ref 2.5–4.6)

## 2016-07-20 MED ORDER — HEPARIN SOD (PORK) LOCK FLUSH 100 UNIT/ML IV SOLN
500.0000 [IU] | Freq: Once | INTRAVENOUS | Status: AC
  Administered 2016-07-20: 500 [IU] via INTRAVENOUS

## 2016-07-20 MED ORDER — SODIUM CHLORIDE 0.9% FLUSH
10.0000 mL | INTRAVENOUS | Status: DC | PRN
  Administered 2016-07-20: 10 mL via INTRAVENOUS
  Filled 2016-07-20: qty 10

## 2016-07-20 NOTE — Progress Notes (Signed)
Mason Jim presented for Portacath access and flush. Portacath located right chest wall accessed with  H 20 needle. Good blood return present. Portacath flushed with 81ml NS and 500U/38ml Heparin and needle removed intact. Procedure without incident. Patient tolerated procedure well. Labs drawn per orders.  Blood bracelet placed for blood transfusion on Thursday.  Follow up as scheduled.

## 2016-07-20 NOTE — Patient Instructions (Signed)
[facility] at [facility] Discharge Instructions  RECOMMENDATIONS MADE BY THE CONSULTANT AND ANY TEST RESULTS WILL BE SENT TO YOUR REFERRING PHYSICIAN.  Port flush with labs done Follow up as scheduled.  Thank you for choosing [facility] at [facility] to provide your oncology and hematology care.  To afford each patient quality time with our provider, please arrive at least 15 minutes before your scheduled appointment time.    If you have a lab appointment with the Cancer Center please come in thru the  Main Entrance and check in at the main information desk  You need to re-schedule your appointment should you arrive 10 or more minutes late.  We strive to give you quality time with our providers, and arriving late affects you and other patients whose appointments are after yours.  Also, if you no show three or more times for appointments you may be dismissed from the clinic at the providers discretion.     Again, thank you for choosing [facility].  Our hope is that these requests will decrease the amount of time that you wait before being seen by our physicians.       _____________________________________________________________  Should you have questions after your visit to [facility], please contact our office at [phone] between the hours of 8:30 a.m. and 4:30 p.m.  Voicemails left after 4:30 p.m. will not be returned until the following business day.  For prescription refill requests, have your pharmacy contact our office.       Resources For Cancer Patients and their Caregivers ? American Cancer Society: Can assist with transportation, wigs, general needs, runs Look Good Feel Better.        1-888-227-6333 ? Cancer Care: Provides financial assistance, online support groups, medication/co-pay assistance.  1-800-813-HOPE (4673) ? [facility] Assists [place] cancer patients  and their families through emotional , educational and financial support.  336-427-4357 ? [place] DSS Where to apply for food stamps, Medicaid and utility assistance. 336-342-1394 ? [facility]: Transportation to medical appointments. 336-347-2287 ? Social Security Administration: May apply for disability if have a Stage IV cancer. 336-342-7796 1-800-772-1213 ? [place] Aging, Disability and Transit Services: Assists with nutrition, care and transit needs. 336-349-2343  Cancer Center Support Programs: @10RELATIVEDAYS@ > Cancer Support Group  2nd Tuesday of the month 1pm-2pm, Journey Room  > Creative Journey  3rd Tuesday of the month 1130am-1pm, Journey Room  > Look Good Feel Better  1st Wednesday of the month 10am-12 noon, Journey Room (Call American Cancer Society to register 1-800-395-5775)   

## 2016-07-20 NOTE — Progress Notes (Signed)
Nutrition Follow-up:   ASSESSMENT: 41 y/o female PMHx Recurrent Cervical Cancer, currently under observation. Had developed severe protein calorie malnutrition secondary to her treatment s/p PEG placement 5/22 w/ initiation of TF support.  Wt today is 58 lbs 2 oz which is a 2 lb loss in the last 2 weeks.   Pt reports that she has been extremely compliant with 3 cans of Osmolite 1.5/day.  Sometimes she leaves a little for later due to being too full, but she makes sure she has done her 3 cans by the end of the day. She says she has cut her free water flushes down to 20 cc before and after each can due to this Rarely, she will only get 2 cans in, but when this happens, she says she tries to make up for it by eating more by mouth.   In terms of tolerance, she says the first can is the only one that gives her trouble. She will have a "looser" bm ~1 hour after her first can. She will go 2-4x in a short time period. "Whatever I put in comes out". However, she denies any irregular BM frequency with the second 2 cans of tube feeding. She stays on top of her nausea and premedicates with her anti nausea/emetics   Her oral intake is also superb. She says she always eats 3 meals/day and these meals consist of high kcal/protein foods. RD went through dietary recall to assess quality of diet. She says her breakfast she typically will eat eggs. Yesterday for Lunch, she had pizza, fish and a salad. Her dinner was a hamburger with gravy. She prioritizes high kcal/protein foods. She actually has been counting calories and aiming for an additional 1200 kcals by mouth or 2000 kcals/day. She does drink Ensure and Boost  Though her weight is down, she FEELS better. She does not know how she has continued to lose weight and is very disappointed today.  Medications: Calcium+ D, Prednisone, PPI, Zofran, Osmolite 1.5 3 cans/day, Iron, Hydrocodone, Kphos, ABx, Ativan  Labs:   Recent Labs Lab 07/20/16 0925  NA 132*  K  3.9  CL 97*  CO2 27  BUN 12  CREATININE 0.70  CALCIUM 8.4*  MG 1.7  PHOS 2.5  GLUCOSE 99   other: Albumin:2.1, wbc now WDL, Phos/mag now WDL  Anthropometrics:  Height:  5' (152.4 cm) Weight: 58 lbs 2 oz (26.42 kg) BMI: 11.4  Estimated Energy Needs Kcals: >1050 kcals (40 kcal/kg bw) Protein: >40 g Pro (1.5g/kg bw)  Fluid: >.95 Liters (35 ml/kg bw)  NUTRITION DIAGNOSIS: Severe malnutrition related to Cancer and Cancer related treatments AEB Severe muscle/fat wasting  MALNUTRITION DIAGNOSIS: Severe in Chronic Context  INTERVENTION:  Commended patient on her compliance with the tube feeding and her profound motivation and determination to gain weight. She has shown great initiative by counting her calories.   Reviewed her dietary recall and made recommendations, though largely her diet/eating habits are ideal is ideal.   Explained that just because she is eating food and infusing the tube feeding, this doesn't mean her body is absorbing it. It is also very important to note that she FEELS better, even though her weight is down.   Despite her estimated caloric intake of >2000 kcals/day she has continued to lose weight. Do not have reason to suspect she is being untruthful about her po/tf intake. Suspect she has impaired anabolic activity due to her malignancy. She also may have impaired absorption.   She is already  consuming 75 kcal/bw, however, the relevance of this value is diminished due to her extremely low weightt. Do not feel that there is any benefit to increasing her TF further at this time, especially given the fact she feels better. RD asked pt to continue with current regimen. She has only had the TF running for ~1 month. Will monitor for now and follow closely.   MONITORING, EVALUATION, GOAL: Weight, TF tolerance  NEXT VISIT: 1-2 Weeks  Burtis Junes RD, LDN, CNSC Clinical Nutrition Pager: [phone] 07/20/2016 11:03 AM

## 2016-07-20 NOTE — Progress Notes (Signed)
[place] Clinical Social Work  Clinical Social Work was referred by need for follow up, emotional support from Engineer, mining due to ongoing discouragement due to continued weight struggles. Clinical Social Worker met with patient, significant other and daughter at [facility] to offer support and assess for needs.  Pt reports she is trying really hard to gain weight and has felt down as a result. Pt reports she is feeling better overall and has had an increase in energy. CSW provided supportive listening and encouragement. Pt's family continues to provide strong support. CSW inquired about other possible needs at home or lack of resources that could assist. Pt and family deny other needs, CSW provided contact information and encouraged them to reach out with any concerns.     Clinical Social Work interventions: Supportive listening Resource education  Loren Racer, LCSW, OSW-C [place] [facility]   [phone]

## 2016-07-22 ENCOUNTER — Encounter (HOSPITAL_BASED_OUTPATIENT_CLINIC_OR_DEPARTMENT_OTHER): Payer: Medicaid - Out of State

## 2016-07-22 ENCOUNTER — Encounter (HOSPITAL_COMMUNITY): Payer: Self-pay

## 2016-07-22 DIAGNOSIS — D649 Anemia, unspecified: Secondary | ICD-10-CM | POA: Diagnosis not present

## 2016-07-22 DIAGNOSIS — C539 Malignant neoplasm of cervix uteri, unspecified: Secondary | ICD-10-CM

## 2016-07-22 LAB — PREPARE RBC (CROSSMATCH)

## 2016-07-22 LAB — ABO/RH: ABO/RH(D): B POS

## 2016-07-22 MED ORDER — SODIUM CHLORIDE 0.9 % IV SOLN
250.0000 mL | Freq: Once | INTRAVENOUS | Status: AC
  Administered 2016-07-22: 250 mL via INTRAVENOUS

## 2016-07-22 MED ORDER — DIPHENHYDRAMINE HCL 25 MG PO CAPS
25.0000 mg | ORAL_CAPSULE | Freq: Once | ORAL | Status: AC
  Administered 2016-07-22: 25 mg via ORAL
  Filled 2016-07-22: qty 1

## 2016-07-22 MED ORDER — SODIUM CHLORIDE 0.9% FLUSH
10.0000 mL | INTRAVENOUS | Status: AC | PRN
  Administered 2016-07-22: 10 mL

## 2016-07-22 MED ORDER — SODIUM CHLORIDE 0.9% FLUSH: 3.0000 mL | INTRAVENOUS | Status: DC | PRN

## 2016-07-22 MED ORDER — HEPARIN SOD (PORK) LOCK FLUSH 100 UNIT/ML IV SOLN
500.0000 [IU] | Freq: Every day | INTRAVENOUS | Status: AC | PRN
  Administered 2016-07-22: 500 [IU]
  Filled 2016-07-22: qty 5

## 2016-07-22 MED ORDER — ACETAMINOPHEN 325 MG PO TABS
650.0000 mg | ORAL_TABLET | Freq: Once | ORAL | Status: AC
  Administered 2016-07-22: 650 mg via ORAL
  Filled 2016-07-22: qty 2

## 2016-07-22 NOTE — Patient Instructions (Addendum)
[place] at [facility] Discharge Instructions  RECOMMENDATIONS MADE BY THE CONSULTANT AND ANY TEST RESULTS WILL BE SENT TO YOUR REFERRING PHYSICIAN.  Received blood transfusion with 1 unit of blood given today. Follow-up as schedued. Call clinic for any questions or concerns  Thank you for choosing [place] at [facility] to provide your oncology and hematology care.  To afford each patient quality time with our provider, please arrive at least 15 minutes before your scheduled appointment time.    If you have a lab appointment with the [place] please come in thru the  Main Entrance and check in at the main information desk  You need to re-schedule your appointment should you arrive 10 or more minutes late.  We strive to give you quality time with our providers, and arriving late affects you and other patients whose appointments are after yours.  Also, if you no show three or more times for appointments you may be dismissed from the clinic at the providers discretion.     Again, thank you for choosing [facility].  Our hope is that these requests will decrease the amount of time that you wait before being seen by our physicians.       _____________________________________________________________  Should you have questions after your visit to [facility], please contact our office at [phone] between the hours of 8:30 a.m. and 4:30 p.m.  Voicemails left after 4:30 p.m. will not be returned until the following business day.  For prescription refill requests, have your pharmacy contact our office.       Resources For Cancer Patients and their Caregivers ? American Cancer Society: Can assist with transportation, wigs, general needs, runs Look Good Feel Better.        647-094-4148 ? Cancer Care: Provides financial assistance, online support groups, medication/co-pay assistance.  1-800-813-HOPE  352-709-7616) ? [place] Assists [place] cancer patients and their families through emotional , educational and financial support.  504-795-2372 ? [place] DSS Where to apply for food stamps, Medicaid and utility assistance. 419-556-5807 ? [facility]: Transportation to medical appointments. 626-087-7888 ? Social Security Administration: May apply for disability if have a Stage IV cancer. (660) 471-3603 716-076-1051 ? [employer], Disability and Transit Services: Assists with nutrition, care and transit needs. [place] Support Programs: @10RELATIVEDAYS @ > Cancer Support Group  2nd Tuesday of the month 1pm-2pm, Journey Room  > Creative Journey  3rd Tuesday of the month 1130am-1pm, Journey Room  > Look Good Feel Better  1st Wednesday of the month 10am-12 noon, Journey Room (Call [place] to register [phone])

## 2016-07-22 NOTE — Progress Notes (Signed)
Erika [name] tolerated blood transfusion of 1 unit of blood well without complaints or incident.Permit signed.Pt's daughter had an appt scheduled for this afternoon so Erika [name] wanted to come back for her 2nd unit of blood tomorrow. Sherry in blood bank reported that the 2nd unit of blood will not expire until tomorrow at 12 midnight so pt will return tomorrow at 1015. VSS upon discharge. Pt discharged via wheelchair in satisfactory condition accompanied by her daughters

## 2016-07-23 ENCOUNTER — Encounter (HOSPITAL_COMMUNITY): Payer: Medicaid - Out of State

## 2016-07-23 ENCOUNTER — Other Ambulatory Visit (HOSPITAL_COMMUNITY): Payer: Self-pay | Admitting: Adult Health

## 2016-07-23 DIAGNOSIS — C539 Malignant neoplasm of cervix uteri, unspecified: Secondary | ICD-10-CM

## 2016-07-23 DIAGNOSIS — R634 Abnormal weight loss: Secondary | ICD-10-CM

## 2016-07-24 LAB — BPAM RBC
BLOOD PRODUCT EXPIRATION DATE: 201806292359
Blood Product Expiration Date: 201806302359
ISSUE DATE / TIME: 201806211033
Unit Type and Rh: 1700
Unit Type and Rh: 1700

## 2016-07-24 LAB — TYPE AND SCREEN
ABO/RH(D): B POS
Antibody Screen: NEGATIVE
UNIT DIVISION: 0
Unit division: 0

## 2016-07-30 ENCOUNTER — Telehealth (HOSPITAL_COMMUNITY): Payer: Self-pay

## 2016-07-30 ENCOUNTER — Telehealth: Payer: Self-pay | Admitting: *Deleted

## 2016-07-30 NOTE — Telephone Encounter (Signed)
Contacted the patient and scheduled appt for follow up after scan. Appt for July 6th at 10:45am. Patient aware

## 2016-07-30 NOTE — Telephone Encounter (Signed)
Nutrition Follow-up:  Spoke with patient via phone this am for nutrition follow-up.  Patient reports "I am doing good and feeling better."  Reports that she is taking 3 cans of osmolite 1.5 but has cut her water flush back to 25ml before and after.  Reports bowel movement typically with first can of tube feeding (loose) then more solid as she eats more solid foods during the day.  Reports that she is trying to continue to eat solid foods and has been trying to eat close to 2000-3000 calories per day including tube feeding.  "I calculated what is in the tube feeding and I am trying to eat that amount orally as well."  Noted patient being treated for UTI.   Anthropometrics:   Weight on 6/21 documented as 60 lb 6.4 oz increased from 58 lb 2 oz   Estimated Energy Needs  Kcals: > 1050 calories (40 kcals/kg bw) Protein: > 40 g (1.5gkg bw) Fluid: > 1 L/d  NUTRITION DIAGNOSIS: severe malnutrition continues   MALNUTRITION DIAGNOSIS: severe malnutrition continues   INTERVENTION:   Congratulated patient on job well done with counting calories and continue to take 3 cans of tube feeding.  Encouraged her to continue her efforts.      MONITORING, EVALUATION, GOAL: Weight TF tolerance   NEXT VISIT: phone follow-up, July 20th  Shaquera Ansley B. Zenia Resides, [place], [place] Registered Dietitian [phone] (pager)

## 2016-08-03 ENCOUNTER — Ambulatory Visit (HOSPITAL_COMMUNITY)
Admission: RE | Admit: 2016-08-03 | Discharge: 2016-08-03 | Disposition: A | Discharge status: Home / Self Care | Payer: PRIVATE HEALTH INSURANCE | Source: Ambulatory Visit | Attending: Adult Health | Admitting: Adult Health

## 2016-08-03 ENCOUNTER — Telehealth (HOSPITAL_COMMUNITY): Payer: Self-pay | Admitting: Adult Health

## 2016-08-03 DIAGNOSIS — C539 Malignant neoplasm of cervix uteri, unspecified: Secondary | ICD-10-CM

## 2016-08-03 DIAGNOSIS — C787 Secondary malignant neoplasm of liver and intrahepatic bile duct: Secondary | ICD-10-CM | POA: Insufficient documentation

## 2016-08-03 DIAGNOSIS — R634 Abnormal weight loss: Secondary | ICD-10-CM

## 2016-08-03 DIAGNOSIS — R1900 Intra-abdominal and pelvic swelling, mass and lump, unspecified site: Secondary | ICD-10-CM | POA: Insufficient documentation

## 2016-08-03 DIAGNOSIS — N1330 Unspecified hydronephrosis: Secondary | ICD-10-CM | POA: Insufficient documentation

## 2016-08-03 LAB — GLUCOSE, CAPILLARY: Glucose-Capillary: 101 mg/dL — ABNORMAL HIGH (ref 65–99)

## 2016-08-03 MED ORDER — FLUDEOXYGLUCOSE F - 18 (FDG) INJECTION
5.0000 | Freq: Once | INTRAVENOUS | Status: AC | PRN
  Administered 2016-08-03: 5 via INTRAVENOUS

## 2016-08-03 NOTE — Telephone Encounter (Signed)
Paged Dr. Matilde Sprang, urologist on-call this evening, to discuss Erika [name]' PET results (imaging done today, 08/03/16) with new moderate/severe hydroureteronephrosis from central pelvic mass.     Briefly shared Erika [name]' cancer history and previous treatment, and current issues with cachexia.  Her creatinine about 3 weeks ago was normal at 0.7.  She has chronic pelvic pressure and pain, but there were no new complaints of flank pain at that time/evaluation on 07/20/16.  My question to urology was regarding their recommendations for managing new hydronephrosis.   Per Dr. Matilde Sprang, he would not recommend any intervention tonight with asymptomatic hydronephrosis and normal kidney function. She may need outpatient referral to urology in the future, but he did not recommend immediate intervention. He shared that they will perform stent placement if patient is symptomatic with flank pain, or if creatinine needed to be optimized for additional chemotherapy.    I shared with Dr. Matilde Sprang that Erika [name] would not likely be a candidate for additional chemotherapy given her progressive cachexia.   Thanked Dr. Matilde Sprang for his recommendations.  Discussed with Joylene John, NP with Gyn-Onc. Patient is scheduled to see Dr. Denman George with Gyn-Onc this Friday, 08/06/16 to review scan results.     Mike Craze, NP [place] [phone]

## 2016-08-06 ENCOUNTER — Ambulatory Visit: Payer: PRIVATE HEALTH INSURANCE | Attending: Gynecologic Oncology | Admitting: Gynecologic Oncology

## 2016-08-06 ENCOUNTER — Encounter: Payer: Self-pay | Admitting: Gynecologic Oncology

## 2016-08-06 VITALS — BP 109/73 | HR 128 | Resp 20 | Wt <= 1120 oz

## 2016-08-06 DIAGNOSIS — E860 Dehydration: Secondary | ICD-10-CM | POA: Insufficient documentation

## 2016-08-06 DIAGNOSIS — E43 Unspecified severe protein-calorie malnutrition: Secondary | ICD-10-CM | POA: Diagnosis not present

## 2016-08-06 DIAGNOSIS — Z9851 Tubal ligation status: Secondary | ICD-10-CM | POA: Insufficient documentation

## 2016-08-06 DIAGNOSIS — C787 Secondary malignant neoplasm of liver and intrahepatic bile duct: Secondary | ICD-10-CM | POA: Diagnosis not present

## 2016-08-06 DIAGNOSIS — E46 Unspecified protein-calorie malnutrition: Secondary | ICD-10-CM | POA: Diagnosis not present

## 2016-08-06 DIAGNOSIS — E639 Nutritional deficiency, unspecified: Secondary | ICD-10-CM

## 2016-08-06 DIAGNOSIS — N823 Fistula of vagina to large intestine: Secondary | ICD-10-CM | POA: Diagnosis not present

## 2016-08-06 DIAGNOSIS — F419 Anxiety disorder, unspecified: Secondary | ICD-10-CM | POA: Insufficient documentation

## 2016-08-06 DIAGNOSIS — N820 Vesicovaginal fistula: Secondary | ICD-10-CM

## 2016-08-06 DIAGNOSIS — Z9889 Other specified postprocedural states: Secondary | ICD-10-CM | POA: Insufficient documentation

## 2016-08-06 DIAGNOSIS — Z8249 Family history of ischemic heart disease and other diseases of the circulatory system: Secondary | ICD-10-CM | POA: Diagnosis not present

## 2016-08-06 DIAGNOSIS — Z931 Gastrostomy status: Secondary | ICD-10-CM | POA: Insufficient documentation

## 2016-08-06 DIAGNOSIS — E876 Hypokalemia: Secondary | ICD-10-CM | POA: Insufficient documentation

## 2016-08-06 DIAGNOSIS — D259 Leiomyoma of uterus, unspecified: Secondary | ICD-10-CM | POA: Diagnosis not present

## 2016-08-06 DIAGNOSIS — Z825 Family history of asthma and other chronic lower respiratory diseases: Secondary | ICD-10-CM | POA: Diagnosis not present

## 2016-08-06 DIAGNOSIS — C539 Malignant neoplasm of cervix uteri, unspecified: Secondary | ICD-10-CM | POA: Diagnosis not present

## 2016-08-06 DIAGNOSIS — R64 Cachexia: Secondary | ICD-10-CM | POA: Diagnosis not present

## 2016-08-06 DIAGNOSIS — Z87891 Personal history of nicotine dependence: Secondary | ICD-10-CM | POA: Diagnosis not present

## 2016-08-06 DIAGNOSIS — G620 Drug-induced polyneuropathy: Secondary | ICD-10-CM

## 2016-08-06 DIAGNOSIS — T451X5A Adverse effect of antineoplastic and immunosuppressive drugs, initial encounter: Secondary | ICD-10-CM

## 2016-08-06 NOTE — Patient Instructions (Signed)
Your cancer has progressed and is larger in the liver and in the pelvis (where the original cervical cancer was). It is causing erosion of the cancer into the bladder and into the rectum which is causing a fistula (this means communication between the rectum and vagina and between the bladder and vagina). These communications/passageways cannot be fixed, however, with surgical procedures a colostomy bag can be created which can divert stool into a colostomy bag and the urine can be made to drain directly from your kidneys into tubes and bags. This would require surgical procedures but would mean you wouldn't leak as much urine or stool from the vagina.  Your cancer is not curable unfortunately. However, there are other chemotherapy or immune therapy options to attempt. However, these will not make the cancer go away completely, though they may slow its growth some. However, they are associated with significant side effects which you may have trouble tolerating due to your low weight and fatigue levels.  Another option at this time would be to transition to hospice care in which providers would visit you in the home setting and provide comfort care strategies for you at home.  Please follow-up with Erika [name] at the [facility] office as scheduled, or you can return to see Erika [name] as needed.

## 2016-08-08 DIAGNOSIS — N820 Vesicovaginal fistula: Secondary | ICD-10-CM | POA: Insufficient documentation

## 2016-08-08 DIAGNOSIS — N823 Fistula of vagina to large intestine: Secondary | ICD-10-CM | POA: Insufficient documentation

## 2016-08-08 DIAGNOSIS — E43 Unspecified severe protein-calorie malnutrition: Secondary | ICD-10-CM | POA: Insufficient documentation

## 2016-08-08 NOTE — Progress Notes (Signed)
Followup Gyn Onc Note Consult was initially requested by Dr. Evie Lacks for the evaluation of Erika [name] 41 y.o. female with vaginal bleeding and a cervical mass in January, 2016.  CC:  Chief Complaint  Patient presents with  . Cervical Cancer    Assessment/Plan:  [name]  is a 41 y.o.  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  I discussed that this was not a curable malignancy. I discussed that options were for either third line palliative chemotherapy vs palliative care and hospice. I do not feel that she has a performance status that would tolerate palliative chemotherapy, and the patient doubts that she wants this too.  She will discuss this further with her family and let her providers know what she chooses.  With respect to the vesicovaginal fistula, I discussed that PCN tubes placed may help keep her dry. For the colovaginal fistula, she may be a candidate for a diverting colostomy if she elects for this (to control leakage of feces).  She is considering both of these options.  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 Counseled regarding strategies to optimize nutrition goals. Continue PEG feeds until patient elects for hospice.  HPI: Erika [name] is a 41 year old G2 P2 who was seen in consultation at the request of Dr. Evie Lacks for high-grade cervical dysplasia and a cervical mass. She was seen in the emergency room on 01/30/2014 for vaginal hemorrhage. A transvaginal ultrasound was performed  and revealed the uterus with fibroids measuring in total 11.6 x 9 x 9 cm, and a solid mass noted in the region of the cervix measuring 5.7 x 3.8 x 4 cm. The patient received a blood transfusion for her symptomatic anemia. A biopsy was taken of the cervical mass. Biopsy revealed at least  high-grade dysplasia, however the cause of the tangential cart on the biopsy frank invasion could not be either ruled in or out.  Physical examination confirmed parametrial involvement on the right, resulting in a stage IIB diagnosis.  Pretreatment PET/CT on 02/25/14 revealed positive pelvic lymph nodes bilaterally.  Posttreatment PET/CT on 07/12/14 showed reduction in size and metabolic activity of cervix to 3.2x2.6cm and no hypermetabolic activity in lymph nodes. Confirmed complete response to therapy.  She received primary chemoradiation with cisplatin weekly, and pelvic radiation therapy and intracavitary brachytherapy administered at [facility] with Dr. Sondra Come completed in late April, 2016.   In October, 2017 she was diagnosed with recurrence in the liver with multiple liver lesions on CT. PET/CT on 12/08/16 showing new small hypermetabolic right internal mammary soft tissue density, suspicious for metastatic lymphadenopathy. Increased size and number of hypermetabolic liver metastases. New mild hypermetabolic activity in porta hepatis is suspicious for lymph node metastases. No residual hypermetabolic cervical mass identified. Multiple uterine fibroids again seen without FDG uptake. New diffuse rectal wall thickening with hypermetabolic activity, suspicious for proctitis.  Initial US guided needle biopsy on 11/26/15 showed necrosis highly suspicious for metastatic SCC.  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   She went on to receive 6 cycles of carboplatin and paclitaxel with bevacizumab (cycle 6 on 05/14/16) with treatment delays and poor tolerance related to severe cachexia and malnutrition, dehydration, boney pains, neuropathy. She has lost substantial weight (now 66lbs). Her PET/CT shows signs concerning for rectovaginal fistula but she endorses no concerning symptoms and  declines an exam.  PET/CT on 05/24/16 showed: Continued reduction in size and  metabolism of solitary mildly hypermetabolic right liver dome metastasis. No new or progressive hypermetabolic metastatic disease. There is a mildly hypermetabolic 0.7 x 0.7 cm right liver dome mass with max SUV 3.1 (series 4/image 90), previously 1.5 x 1.2 cm on 04/08/2016 with max SUV 3.3, decreased in size and slightly decreased in metabolism. Given the apparent complete/partial response to chemotherapy on scans and the patients profound poor performance status on chemotherapy and anorexia/malnutrition, a decision was made to give no further chemotherapy, but to instead monitor with follow-up PET in 3 months.  Interval History:  She had no further chemotherapy for approximately 3 months. During that time she continued to lose weight. She received placement of a PEG feeding tube, however this did not result in weight gain. The patient reports that immediately following her PEG tube placement she began leaking feces and urine from her vagina. Her care team felt that this might be due to the increased GI intake she was getting from her PEG feeds.   Scheduled follow-up PET on 08/13/16 showed Interval progression of metastatic disease in the right lateral liver dome. Increased size and hypermetabolic activity within central pelvic mass in region of the cervix and vagina. Increased internal soft tissue gas noted, which may be due to tumor necrosis or rectovaginal fistula. New moderate severe right hydroureteronephrosis, which appears to be due to enlarging central pelvic mass. No evidence of metastatic disease within the chest or neck.  Current Meds:  Outpatient Encounter Prescriptions as of 08/06/2016  Medication Sig  . calcium-vitamin D (OSCAL 500/200 D-3) 500-200 MG-UNIT per tablet Take 1 tablet by mouth 2 (two) times daily.  Marland Kitchen estrogen, conjugated,-medroxyprogesterone (PREMPRO) 0.3-1.5 MG per tablet Take 1 tablet by mouth daily. (Patient taking differently: Take 1 tablet by mouth See admin instructions.  Take 1 tablet by mouth daily for 2 weeks then stop for 2 weeks)  . ferrous sulfate 325 (65 FE) MG tablet Take 325 mg by mouth daily with breakfast.  . HYDROcodone-acetaminophen (NORCO) 7.5-325 MG tablet Take 1 tablet by mouth every 4 (four) hours as needed for moderate pain.  Marland Kitchen lidocaine-prilocaine (EMLA) cream Apply to affected area once  . LORazepam (ATIVAN) 1 MG tablet TAKE 1 TABLET BY MOUTH AT BEDTIME AS NEEDED FOR ANXIETY /INSOMNIA  . naproxen sodium (ANAPROX) 220 MG tablet Take 220 mg by mouth 4 (four) times daily as needed (for pain).   . Nutritional Supplements (FEEDING SUPPLEMENT, OSMOLITE 1.5 CAL,) LIQD Give 1 can 3 times per day.  Flush with 34ml of water before and after feeding 3 times per day.  . ondansetron (ZOFRAN) 8 MG tablet Take 1 tablet (8 mg total) by mouth 2 (two) times daily as needed for refractory nausea / vomiting. Start on day 3 after chemo.  . pantoprazole (PROTONIX) 40 MG tablet Take 1 tablet (40 mg total) by mouth daily.  . potassium phosphate, monobasic, (K-PHOS ORIGINAL) 500 MG tablet Take 1 tablet (500 mg total) by mouth 3 (three) times daily with meals.  . predniSONE (DELTASONE) 20 MG tablet Take 1 tablet (20 mg total) by mouth daily with breakfast.  . prochlorperazine (COMPAZINE) 10 MG tablet Take 1 tablet (10 mg total) by mouth every 6 (six) hours as needed (Nausea or vomiting).  Marland Kitchen sulfamethoxazole-trimethoprim (BACTRIM DS,SEPTRA DS) 800-160 MG tablet Take 1 tablet by mouth 2 (two) times daily.   No facility-administered encounter medications on file as of 08/06/2016.  Allergy: No Known Allergies  Social Hx:   Social History   Social History  . Marital status: Single    Spouse name: N/A  . Number of children: 2  . Years of education: N/A   Occupational History  . cook at [employer] History Main Topics  . Smoking status: Former Smoker    Packs/day: 0.50    Years: 6.00    Types: Cigarettes    Quit date: 08/02/2011  . Smokeless tobacco:  Never Used  . Alcohol use No  . Drug use: Yes    Types: Marijuana  . Sexual activity: Yes   Other Topics Concern  . Not on file   Social History Narrative  . No narrative on file    Past Surgical Hx:  Past Surgical History:  Procedure Laterality Date  . CESAREAN SECTION    . IR GASTROSTOMY TUBE MOD SED  06/22/2016  . port a cath placement      January 2016 / right   . TANDEM RING INSERTION N/A 04/09/2014   Procedure: TANDEM RING PLACEMENT FOR HIGH DOSE RATE RADIATION THERAPY EXAM UNDER ANETHESIA PLACEMENT OF CERVICAL SLEEVE;  Surgeon: Gery Pray, MD;  Location: [facility];  Service: Urology;  Laterality: N/A;  . TANDEM RING INSERTION N/A 04/23/2014   Procedure: TANDEM RING PLACEMENT ;  Surgeon: Gery Pray, MD;  Location: [facility];  Service: Urology;  Laterality: N/A;  . TANDEM RING INSERTION N/A 04/30/2014   Procedure: TANDEM RING PLACEMENT,EXAM UNDER ANESTHESIA ;  Surgeon: Gery Pray, MD;  Location: [facility];  Service: Urology;  Laterality: N/A;  . TANDEM RING INSERTION N/A 05/07/2014   Procedure: TANDEM RING PLACEMENT;  Surgeon: Gery Pray, MD;  Location: [facility];  Service: Urology;  Laterality: N/A;  . TANDEM RING INSERTION N/A 05/17/2014   Procedure: TANDEM RING INSERTION;  Surgeon: Gery Pray, MD;  Location: [facility];  Service: Urology;  Laterality: N/A;  . TUBAL LIGATION      Past Medical Hx:  Past Medical History:  Diagnosis Date  . Anxiety   . Cervical cancer ([place])   . Cervical cancer, FIGO stage IIB ([place]) 04/09/2014  . Family history of adverse reaction to anesthesia    pt states her sister has to have the "reversal" after surgery   . Hypokalemia   . Microcytic hypochromic anemia   . Recurrent cervical cancer ([place]) 12/12/2015    Past Gynecological History:   G2 P2 with 1 SV D and one cesarean section. She's had a tubal ligation at the time of cesarean section. Last Pap smear was in 2002. She is no history of abnormal cytology. No LMP recorded. Patient  is not currently having periods (Reason: Chemotherapy).  Family Hx:  Family History  Problem Relation Age of Onset  . Asthma Mother   . Heart attack Father     Review of Systems:  Constitutional  Feels fatigued    ENT Normal appearing ears and nares bilaterally Skin/Breast  No rash, sores, jaundice, itching, dryness Cardiovascular  No chest pain, shortness of breath, or edema  Pulmonary  No cough or wheeze.  Gastro Intestinal  No nausea, vomitting, or diarrhoea. No bright red blood per rectum, no abdominal pain, change in bowel movement, or constipation. + poor appetite. Genito Urinary  + leakage of feces and urine from vagina (no bleeding) Musculo Skeletal  + arthralgia Neurologic  + neuropathy feet Psychology  + depression  Vitals:  Blood pressure 109/73, pulse (!) 128, resp. rate  20, weight 56 lb 3.2 oz (25.5 kg), SpO2 98 %.  Physical Exam: General: profound cachexia. Odor of urine and feces apparent WD in NAD Neck  Supple NROM, without any enlargements.  Lymph Node Survey No cervical supraclavicular adenopathy Cardiovascular  Pulse normal rate, regularity and rhythm. S1 and S2 normal.  Lungs  Clear to auscultation bilateraly, without wheezes/crackles/rhonchi. Good air movement.  Skin  No rash/lesions/breakdown  Psychiatry  Alert and oriented to person, place, and time  Abdomen  Normoactive bowel sounds, abdomen soft, non-tender and thin without evidence of hernia.  Back No CVA tenderness Genito Urinary  deferred Rectal  deferred Extremities  No bilateral cyanosis, clubbing or edema.   Donaciano Eva, MD    CC: Dr Evie Lacks 08/08/2016, 9:54 PM

## 2016-08-09 ENCOUNTER — Other Ambulatory Visit (HOSPITAL_COMMUNITY): Payer: Self-pay | Admitting: Emergency Medicine

## 2016-08-09 DIAGNOSIS — C539 Malignant neoplasm of cervix uteri, unspecified: Secondary | ICD-10-CM

## 2016-08-09 MED ORDER — HYDROCODONE-ACETAMINOPHEN 7.5-325 MG PO TABS: 1.0000 | ORAL_TABLET | ORAL | 0 refills | Status: DC | PRN

## 2016-08-09 NOTE — Progress Notes (Signed)
lortab refilled

## 2016-08-18 ENCOUNTER — Telehealth (HOSPITAL_COMMUNITY): Payer: Self-pay

## 2016-08-18 NOTE — Telephone Encounter (Signed)
After reviewing patients needs with provider, it was decided for patient to come in to see provider on Friday and to try monistat at home for the rash. Also if she runs a fever to go to the ER. Patient verbalized understanding but states she really doesn't want to come see the provider on Friday. I explained to patient that it was very important for her to come to the appointment. She states she will try to find someone to bring her.

## 2016-08-18 NOTE — Telephone Encounter (Signed)
Patient called saying she thinks she has another UTI. She is experiencing burning and pain when urinating.she states she is swollen in the pubic area and has a rash also. She is having to wear pull-ups all the time due to the urine and feces leakage. She is having to wear the child size pull-ups because she is so tiny. They do not make a depends small enough for her. She has been using Desitin also but states that is not helping anymore. She states she does not get off to commode much because she is scared too. I asked if she was drinking enough and she stated she is not drinking a lot but she is drinking some. She wanted to know if the provider would call her in some cream and/or antibiotic to help with the pain and UTI.

## 2016-08-20 ENCOUNTER — Ambulatory Visit (HOSPITAL_COMMUNITY): Payer: PRIVATE HEALTH INSURANCE | Admitting: Adult Health

## 2016-08-20 NOTE — Progress Notes (Deleted)
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 Denman George      02/18/2014 Pathology Results    Cervix, biopsy - SQUAMOUS CELL CARCINOMA.      03/07/2014 - 04/24/2014 Radiation Therapy    Dr. Sondra Come- vaginal brachytherapy, 45 Gy to pelvis with sidewall boost 9 Gy and HDR 28.5 Gy in 5 fractions      03/08/2014 - 04/03/2014 Chemotherapy    Weekly CDDP 40 mg/m2 by Dr. 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      07/12/2014 PET scan    Marked interval decrease in size and hypermetabolism of the cervical mass.  The bilateral hypermetabolic pelvic sidewall lymphadenopathy has resolved in the interval.  Areas of hypermetabolic brown fat bilaterally in the chest and abdomen.      11/05/2015 Imaging    MRI abdomen at [facility]- 7.0 x 5.3 cm are in left hepatic lobe with central 3.1 x 2.7 x 2.3 cm complex cystic area, as well as an apparent subcapsular foci      11/26/2015 Procedure    US aspiration of left hepatic lobe- yielding 13 cc of yellow purulent fluid (pain resolved and PO intake increased)      11/26/2015 Pathology Results    [id number]- highly suspicious for squamous cell carcinoma      12/09/2015 PET scan    New small hypermetabolic right internal mammary soft tissue density, suspicious for metastatic lymphadenopathy.  Increased size and number of hypermetabolic liver metastases. New mild hypermetabolic activity in porta hepatis is suspicious for lymph node metastases.  No residual hypermetabolic cervical mass identified.  Multiple uterine fibroids again seen without FDG uptake.  New diffuse rectal wall thickening with hypermetabolic activity, suspicious for proctitis which could be infectious in etiology or secondary to radiation changes ; recommend clinical correlation. Small pelvic fluid collection between the anterior rectal wall and posterior uterine wall, possibly due to abscess.      12/12/2015 Initial Diagnosis    Recurrent cervical cancer ([place])     01/02/2016 Procedure    Port placed by Dr. Ladona Horns      01/09/2016 Procedure    US biopsy      01/12/2016 Pathology Results    Diagnosis Liver, needle/core biopsy - METASTATIC SQUAMOUS CELL CARCINOMA.      01/21/2016 -  Chemotherapy    The patient had palonosetron (ALOXI) injection 0.25 mg, 0.25 mg, Intravenous,  Once, 0 of 4 cycles  bevacizumab (AVASTIN) 625 mg in sodium chloride 0.9 % 100 mL chemo infusion, 15 mg/kg = 625 mg (100 % of original dose 15 mg/kg), Intravenous,  Once, 0 of 4 cycles Dose modification: 15 mg/kg (original dose 15 mg/kg, Cycle 1, Reason: Provider Judgment)  CARBOplatin (PARAPLATIN) in sodium chloride 0.9 % 100 mL chemo infusion,  (original dose ), Intravenous,  Once, 0 of 4 cycles Dose modification:   (Cycle 1)  PACLitaxel (TAXOL) 234 mg in dextrose 5 % 250 mL chemo infusion (> 80mg /m2), 175 mg/m2 = 234 mg, Intravenous,  Once,  0 of 4 cycles  for chemotherapy treatment.        04/08/2016 PET scan    Much improved PET-CT when compared to prior study from 2017. There are 2 slightly hypermetabolic residual hepatic lesions.  Diffuse marrow activity likely due to rebound or marrow stimulating drugs.      05/24/2016 PET scan    1. Continued reduction in size and metabolism of solitary mildly hypermetabolic right liver dome metastasis. No new or progressive hypermetabolic metastatic disease. 2. 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 GYN-ONC consultation advised for  correlation with clinical history and pelvic examination. 3. Stable diffuse marrow hypermetabolism compatible with reactive/stimulated marrow state.      06/22/2016 Procedure    Placement of PEG tube for severe protein calorie malnutrition. (placed by IR [facility]).         INTERVAL HISTORY:  [name] 41 y.o. female returns for follow-up visit.   She is here today with her significant other and her youngest daughter.   Overall, she tells me "I feel good. I am so mad that my weight won't come up."  Endorses that she is using her PEG tube appropriately, as well as eating full meals.  Estimated caloric intake ~2000 cals per dietitian (patient seen by dietitian today as well).   Her pain remains well controlled. Her urinary symptoms are improving; she will complete antibiotics for UTI in 2 days.  She still feels tired, "but I feel like doing much more than I used to."  She has been going out to eat with her family recently, which is new.  She has intermittent leg/feet swelling, "that comes and goes."  She has continued peripheral neuropathy, which is no worse.    Her weight today is 58.1 lbs; down 2 lbs in the past 2.5 weeks.     REVIEW OF SYSTEMS:  Review of Systems  Constitutional: Positive for fatigue. Negative for chills and fever.  HENT:  Negative.  Negative for lump/mass and nosebleeds.   Eyes: Negative.   Respiratory: Negative.  Negative for cough and shortness of breath.   Cardiovascular: Positive for leg swelling. Negative for chest pain.  Gastrointestinal: Negative.  Negative for abdominal pain, blood in stool, constipation, diarrhea, nausea and vomiting.  Endocrine: Negative.   Genitourinary: Positive for bladder incontinence and frequency. Negative for dysuria and hematuria.        Pelvic pressure   Musculoskeletal: Positive for arthralgias.  Skin: Negative.  Negative for rash.  Neurological: Positive for extremity weakness and numbness. Negative for dizziness  and headaches.  Hematological: Negative.  Negative for adenopathy. Does not bruise/bleed easily.  Psychiatric/Behavioral: Negative.  Negative for depression and sleep disturbance. The patient is not nervous/anxious.      PAST MEDICAL/SURGICAL HISTORY:  Past Medical History:  Diagnosis Date  . Anxiety   . Cervical cancer ([place])   . Cervical cancer, FIGO stage IIB ([place]) 04/09/2014  . Family history of adverse reaction to anesthesia    pt states her sister has to have the "reversal" after surgery   . Hypokalemia   . Microcytic hypochromic anemia   . Recurrent cervical cancer ([place]) 12/12/2015   Past Surgical History:  Procedure Laterality Date  . CESAREAN SECTION    . IR GASTROSTOMY TUBE MOD SED  06/22/2016  . port a cath placement      January 2016 / right   . TANDEM RING INSERTION N/A 04/09/2014   Procedure: TANDEM RING PLACEMENT FOR HIGH  DOSE RATE RADIATION THERAPY EXAM UNDER ANETHESIA PLACEMENT OF CERVICAL SLEEVE;  Surgeon: Gery Pray, MD;  Location: [facility];  Service: Urology;  Laterality: N/A;  . TANDEM RING INSERTION N/A 04/23/2014   Procedure: TANDEM RING PLACEMENT ;  Surgeon: Gery Pray, MD;  Location: [facility];  Service: Urology;  Laterality: N/A;  . TANDEM RING INSERTION N/A 04/30/2014   Procedure: TANDEM RING PLACEMENT,EXAM UNDER ANESTHESIA ;  Surgeon: Gery Pray, MD;  Location: [facility];  Service: Urology;  Laterality: N/A;  . TANDEM RING INSERTION N/A 05/07/2014   Procedure: TANDEM RING PLACEMENT;  Surgeon: Gery Pray, MD;  Location: [facility];  Service: Urology;  Laterality: N/A;  . TANDEM RING INSERTION N/A 05/17/2014   Procedure: TANDEM RING INSERTION;  Surgeon: Gery Pray, MD;  Location: [facility];  Service: Urology;  Laterality: N/A;  . TUBAL LIGATION       SOCIAL HISTORY:  Social History   Social History  . Marital status: Single    Spouse name: N/A  . Number of children: 2  . Years of education: N/A   Occupational History  . cook at [employer] History Main Topics  . Smoking status: Former Smoker    Packs/day: 0.50    Years: 6.00    Types: Cigarettes    Quit date: 08/02/2011  . Smokeless tobacco: Never Used  . Alcohol use No  . Drug use: Yes    Types: Marijuana  . Sexual activity: Yes   Other Topics Concern  . Not on file   Social History Narrative  . No narrative on file    FAMILY HISTORY:  Family History  Problem Relation Age of Onset  . Asthma Mother   . Heart attack Father     CURRENT MEDICATIONS:  Outpatient Encounter Prescriptions as of 08/20/2016  Medication Sig Note  . calcium-vitamin D (OSCAL 500/200 D-3) 500-200 MG-UNIT per tablet Take 1 tablet by mouth 2 (two) times daily. 07/09/2016: Twice a week  . estrogen, conjugated,-medroxyprogesterone (PREMPRO) 0.3-1.5 MG per tablet Take 1 tablet by mouth daily. (Patient taking differently: Take 1 tablet by mouth See admin instructions. Take 1 tablet by mouth daily for 2 weeks then stop for 2 weeks)   . ferrous sulfate 325 (65 FE) MG tablet Take 325 mg by mouth daily with breakfast. 07/09/2016: Takes twice a week  . HYDROcodone-acetaminophen (NORCO) 7.5-325 MG tablet Take 1 tablet by mouth every 4 (four) hours as needed for moderate pain.   Marland Kitchen lidocaine-prilocaine (EMLA) cream Apply to affected area once   . LORazepam (ATIVAN) 1 MG tablet TAKE 1 TABLET BY MOUTH AT BEDTIME AS NEEDED FOR ANXIETY /INSOMNIA   . naproxen sodium (ANAPROX) 220 MG tablet Take 220 mg by mouth 4 (four) times daily as needed (for pain).    . Nutritional Supplements (FEEDING SUPPLEMENT, OSMOLITE 1.5 CAL,) LIQD Give 1 can 3 times per day.  Flush with 18ml of water before and after feeding 3 times per day.   . ondansetron (ZOFRAN) 8 MG tablet Take 1 tablet (8 mg total) by mouth 2 (two) times daily as needed for refractory nausea / vomiting. Start on day 3 after chemo.   . pantoprazole (PROTONIX) 40 MG tablet Take 1 tablet (40 mg total) by mouth daily.   . potassium phosphate, monobasic, (K-PHOS  ORIGINAL) 500 MG tablet Take 1 tablet (500 mg total) by mouth 3 (three) times daily with meals.   . predniSONE (DELTASONE) 20 MG tablet Take 1 tablet (20  mg total) by mouth daily with breakfast.   . prochlorperazine (COMPAZINE) 10 MG tablet Take 1 tablet (10 mg total) by mouth every 6 (six) hours as needed (Nausea or vomiting).   Marland Kitchen sulfamethoxazole-trimethoprim (BACTRIM DS,SEPTRA DS) 800-160 MG tablet Take 1 tablet by mouth 2 (two) times daily.    No facility-administered encounter medications on file as of 08/20/2016.     ALLERGIES:  No Known Allergies   PHYSICAL EXAM:  ECOG Performance status: 2 - Symptomatic; requires occasional assistance.    BP: 96/59 HR: 119 Resp: 19 Temp: 98.4 O2 sat: 100%  Weight: 58.1 lbs    Physical Exam  Constitutional: She is oriented to person, place, and time.  Cachetic/emaciated female in no acute distress  HENT:  Head: Normocephalic.  Mouth/Throat: Oropharynx is clear and moist. No oropharyngeal exudate.  Eyes: Pupils are equal, round, and reactive to light. Conjunctivae are normal. No scleral icterus.  Neck: Normal range of motion. Neck supple.  Cardiovascular: Regular rhythm.   Tachycardic   Pulmonary/Chest: Effort normal and breath sounds normal.  Abdominal: Soft. Bowel sounds are normal. There is no tenderness. There is no rebound and no guarding.  G-tube in place   Musculoskeletal: Normal range of motion. She exhibits edema (Mild bilateral edema to toes and feet ).  Lymphadenopathy:    She has no cervical adenopathy.  Neurological: She is alert and oriented to person, place, and time. No cranial nerve deficit. Gait normal.  Skin: Skin is warm and dry. No rash noted.  Psychiatric: Mood, memory, affect and judgment normal.  Nursing note and vitals reviewed.    LABORATORY DATA:  I have reviewed the labs as listed.  CBC    Component Value Date/Time   WBC 9.6 07/20/2016 0925   RBC 2.36 (L) 07/20/2016 0925   HGB 7.5 (L) 07/20/2016  0925   HGB 12.0 12/12/2015 0920   HCT 23.4 (L) 07/20/2016 0925   HCT 37.6 12/12/2015 0920   PLT 460 (H) 07/20/2016 0925   PLT 289 12/12/2015 0920   MCV 99.2 07/20/2016 0925   MCV 89.1 12/12/2015 0920   MCH 31.8 07/20/2016 0925   MCHC 32.1 07/20/2016 0925   RDW 19.0 (H) 07/20/2016 0925   RDW 15.5 (H) 12/12/2015 0920   LYMPHSABS 1.1 07/20/2016 0925   LYMPHSABS 1.0 12/12/2015 0920   MONOABS 0.8 07/20/2016 0925   MONOABS 0.3 12/12/2015 0920   EOSABS 0.0 07/20/2016 0925   EOSABS 0.0 12/12/2015 0920   BASOSABS 0.0 07/20/2016 0925   BASOSABS 0.0 12/12/2015 0920   CMP Latest Ref Rng & Units 07/20/2016 07/09/2016 07/02/2016  Glucose 65 - 99 mg/dL 99 111(H) 122(H)  BUN 6 - 20 mg/dL 12 12 7   Creatinine 0.44 - 1.00 mg/dL 0.70 0.69 0.40(L)  Sodium 135 - 145 mmol/L 132(L) 133(L) 137  Potassium 3.5 - 5.1 mmol/L 3.9 3.1(L) 5.5(H)  Chloride 101 - 111 mmol/L 97(L) 94(L) 99(L)  CO2 22 - 32 mmol/L 27 28 30   Calcium 8.9 - 10.3 mg/dL 8.4(L) 8.6(L) 9.1  Total Protein 6.5 - 8.1 g/dL 6.7 7.1 7.0  Total Bilirubin 0.3 - 1.2 mg/dL 0.4 0.5 0.6  Alkaline Phos 38 - 126 U/L 121 147(H) 141(H)  AST 15 - 41 U/L 14(L) 17 16  ALT 14 - 54 U/L 11(L) 11(L) 10(L)    Results for CARYL, FATE (MRN [id number]) as of 07/20/2016 16:58  Ref. Range 07/20/2016 09:25  Phosphorus Latest Ref Range: 2.5 - 4.6 mg/dL 2.5  Magnesium Latest Ref Range: 1.7 - 2.4  mg/dL 1.7      PENDING LABS:     DIAGNOSTIC IMAGING:  *The following radiologic images and reports have been reviewed independently and agree with below findings.  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 Most recent restaging PET scan 05/24/16 revealed continued reduction in size and metabolism of right liver mets; no new/progressive  metastatic disease. 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  Patient saw Dr. Denman George with Gyn-Onc on 05/24/16; she recommends no additional chemotherapy at this time given patient's poor tolerance and weight loss.  -Serial tumor markers have been largely stable.  -Follow-up with Dr. Denman George in 08/2016 with subsequent restaging PET scan. I will reach out to Dr. Denman George, as I do not see that PET scan or follow-up visits are scheduled.  -Follow-up in ~6 weeks.   Severe protein calorie malnutrition:  -Weight decreased further today to 58.1 lbs (down 4 lbs in 1 month). She is tolerating tube feedings a bit better. When she feels full, she stops and goes back later to finish the can. She is taking in 3 whole cans per day, plus eating by mouth as well.   -Albumin remains low at 2.1 today. Alkaline phosphatase has normalized, which is good.  -She was seen today in conjunction with Burtis Junes, RD (please see his note for additional documentation as well).   -Continue follow-up with dietitian as directed.   Hypophosphatemia/Hypokalemia/Hypomagnesiemia:  -Resolved.   -Continue supplementation as directed. I am encouraged that her electrolytes have improved so well in recent weeks.    Leukocytosis/UTI:  -Resolved. Likely secondary to UTI (currently completing 7-day course of Bactrim DS).  -WBCs normal today. Her urinary symptoms are improving; she continues to have some frequency, but pelvic pressure/pain is decreased.  Shared with her that her urine culture did not grow any specific bacteria, so if symptoms persist after completing antibiotics, then she may need longer course.    Anemia/Thrombocytosis:  -Hgb lower today at 7.5 g/dL. No signs/symptoms of active bleeding.  -Will make arrangements for 2 units PRBCs Thurs., 07/22/16 (as patient does not wish to stay today for transfusion). This is not unreasonable and orders were signed & held today for Thursday.  -Platelets remain slightly elevated at  460,000, but improved from previous. Will keep monitoring.   Bilateral pedal edema:  -Minimal; keep legs elevated as tolerated.   Neoplasm related pain:  -Continue Norco as previously prescribed. No refills needed today.   Adequate bowel regimen:  -Bowels are moving well. She knows the importance of adequate bowel regimen while taking pain medications. Continue laxatives/stool softeners as needed.         Dispo:  -2 units PRBCs on Thursday, 07/22/16.  -Continue follow-up with dietitian as directed.  -Restaging PET scan and follow-up with Dr. Denman George sometime in July (I will reach out to her re: these plans).  -Return to cancer center in about 6 weeks for continued follow-up.    All questions were answered to patient's stated satisfaction. Encouraged patient to call with any new concerns or questions before her next visit to the cancer center and we can certain see her sooner, if needed.    Plan of care discussed with Dr. Talbert Cage, who agrees with the above  aforementioned.     Orders placed this encounter:  No orders of the defined types were placed in this encounter.     Mike Craze, NP [place] [phone]

## 2016-08-25 ENCOUNTER — Encounter (HOSPITAL_COMMUNITY): Payer: PRIVATE HEALTH INSURANCE | Attending: Oncology | Admitting: Adult Health

## 2016-08-25 ENCOUNTER — Encounter (HOSPITAL_COMMUNITY): Payer: Self-pay | Admitting: Adult Health

## 2016-08-25 VITALS — BP 93/58 | HR 120 | Resp 16 | Ht 60.0 in | Wt <= 1120 oz

## 2016-08-25 DIAGNOSIS — R350 Frequency of micturition: Secondary | ICD-10-CM | POA: Diagnosis not present

## 2016-08-25 DIAGNOSIS — R64 Cachexia: Secondary | ICD-10-CM | POA: Diagnosis not present

## 2016-08-25 DIAGNOSIS — G893 Neoplasm related pain (acute) (chronic): Secondary | ICD-10-CM

## 2016-08-25 DIAGNOSIS — K5903 Drug induced constipation: Secondary | ICD-10-CM | POA: Insufficient documentation

## 2016-08-25 DIAGNOSIS — Z87891 Personal history of nicotine dependence: Secondary | ICD-10-CM | POA: Insufficient documentation

## 2016-08-25 DIAGNOSIS — F419 Anxiety disorder, unspecified: Secondary | ICD-10-CM | POA: Insufficient documentation

## 2016-08-25 DIAGNOSIS — R32 Unspecified urinary incontinence: Secondary | ICD-10-CM

## 2016-08-25 DIAGNOSIS — C539 Malignant neoplasm of cervix uteri, unspecified: Secondary | ICD-10-CM | POA: Diagnosis not present

## 2016-08-25 DIAGNOSIS — N820 Vesicovaginal fistula: Secondary | ICD-10-CM

## 2016-08-25 DIAGNOSIS — N823 Fistula of vagina to large intestine: Secondary | ICD-10-CM | POA: Diagnosis not present

## 2016-08-25 DIAGNOSIS — E876 Hypokalemia: Secondary | ICD-10-CM | POA: Insufficient documentation

## 2016-08-25 DIAGNOSIS — C787 Secondary malignant neoplasm of liver and intrahepatic bile duct: Secondary | ICD-10-CM

## 2016-08-25 DIAGNOSIS — R300 Dysuria: Secondary | ICD-10-CM

## 2016-08-25 MED ORDER — CIPROFLOXACIN HCL 250 MG PO TABS: 250.0000 mg | ORAL_TABLET | Freq: Two times a day (BID) | ORAL | 3 refills | Status: AC

## 2016-08-25 MED ORDER — HYDROCODONE-ACETAMINOPHEN 7.5-325 MG PO TABS: 1.0000 | ORAL_TABLET | ORAL | 0 refills | Status: AC | PRN

## 2016-08-25 NOTE — Progress Notes (Signed)
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 Denman George      02/18/2014 Pathology Results    Cervix, biopsy - SQUAMOUS CELL CARCINOMA.      03/07/2014 - 04/24/2014 Radiation Therapy    Dr. Sondra Come- vaginal brachytherapy, 45 Gy to pelvis with sidewall boost 9 Gy and HDR 28.5 Gy in 5 fractions      03/08/2014 - 04/03/2014 Chemotherapy    Weekly CDDP 40 mg/m2 by Dr. 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      07/12/2014 PET scan    Marked interval decrease in size and hypermetabolism of the cervical mass.  The bilateral hypermetabolic pelvic sidewall lymphadenopathy has resolved in the interval.  Areas of hypermetabolic brown fat bilaterally in the chest and abdomen.      11/05/2015 Imaging    MRI abdomen at [facility]- 7.0 x 5.3 cm are in left hepatic lobe with central 3.1 x 2.7 x 2.3 cm complex cystic area, as well as an apparent subcapsular foci      11/26/2015 Procedure    US aspiration of left hepatic lobe- yielding 13 cc of yellow purulent fluid (pain resolved and PO intake increased)      11/26/2015 Pathology Results    [id number]- highly suspicious for squamous cell carcinoma      12/09/2015 PET scan    New small hypermetabolic right internal mammary soft tissue density, suspicious for metastatic lymphadenopathy.  Increased size and number of hypermetabolic liver metastases. New mild hypermetabolic activity in porta hepatis is suspicious for lymph node metastases.  No residual hypermetabolic cervical mass identified.  Multiple uterine fibroids again seen without FDG uptake.  New diffuse rectal wall thickening with hypermetabolic activity, suspicious for proctitis which could be infectious in etiology or secondary to radiation changes ; recommend clinical correlation. Small pelvic fluid collection between the anterior rectal wall and posterior uterine wall, possibly due to abscess.      12/12/2015 Initial Diagnosis    Recurrent cervical cancer ([place])     01/02/2016 Procedure    Port placed by Dr. Ladona Horns      01/09/2016 Procedure    US biopsy      01/12/2016 Pathology Results    Diagnosis Liver, needle/core biopsy - METASTATIC SQUAMOUS CELL CARCINOMA.      01/21/2016 -  Chemotherapy    The patient had palonosetron (ALOXI) injection 0.25 mg, 0.25 mg, Intravenous,  Once, 0 of 4 cycles  bevacizumab (AVASTIN) 625 mg in sodium chloride 0.9 % 100 mL chemo infusion, 15 mg/kg = 625 mg (100 % of original dose 15 mg/kg), Intravenous,  Once, 0 of 4 cycles Dose modification: 15 mg/kg (original dose 15 mg/kg, Cycle 1, Reason: Provider Judgment)  CARBOplatin (PARAPLATIN) in sodium chloride 0.9 % 100 mL chemo infusion,  (original dose ), Intravenous,  Once, 0 of 4 cycles Dose modification:   (Cycle 1)  PACLitaxel (TAXOL) 234 mg in dextrose 5 % 250 mL chemo infusion (> 80mg /m2), 175 mg/m2 = 234 mg, Intravenous,  Once,  0 of 4 cycles  for chemotherapy treatment.        04/08/2016 PET scan    Much improved PET-CT when compared to prior study from 2017. There are 2 slightly hypermetabolic residual hepatic lesions.  Diffuse marrow activity likely due to rebound or marrow stimulating drugs.      05/24/2016 PET scan    1. Continued reduction in size and metabolism of solitary mildly hypermetabolic right liver dome metastasis. No new or progressive hypermetabolic metastatic disease. 2. 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 GYN-ONC consultation advised for  correlation with clinical history and pelvic examination. 3. Stable diffuse marrow hypermetabolism compatible with reactive/stimulated marrow state.      06/22/2016 Procedure    Placement of PEG tube for severe protein calorie malnutrition. (placed by IR at [facility]).       08/03/2016 PET scan    IMPRESSION: Interval progression of metastatic disease in the right lateral liver dome.  Increased size and hypermetabolic activity within central pelvic mass in region of the cervix and vagina. Increased internal soft tissue gas noted, which may be due to tumor necrosis or rectovaginal fistula.  New moderate severe right hydroureteronephrosis, which appears to be due to central pelvic mass described above.  No evidence of metastatic disease within the chest or neck.        INTERVAL HISTORY:  [name] 41 y.o. female returns for follow-up visit.   She is here today, in a wheelchair, with her significant other and her youngest daughter.   Unfortunately, since her last visit to the cancer center, she had PET scan on 08/03/16, which showed recurrent and progressive metastatic disease.  She also has vesicovaginal & colovaginal fistulas.  These results were reviewed with her with Dr. Denman George a few weeks ago. She was offered diversion procedures with colostomy and/or bilateral percutaneous nephrostomy tubes.  She tells me she does not want to go through any more surgery "because I think it will just make things worse."   She tells me that she has has not been feeling well lately. She went to the ED in [facility] yesterday and states, "They took blood from me yesterday and gave me 2 bags of fluids."  She goes on to say, "The only reason I'm here today is because I wanted to give a urine sample and maybe get antibiotics." She thinks she may have another UTI. Symptoms include burning with urination, pelvic pressure, urinary frequency/incontinence.    Continues to use PEG tube appropriately; she  eats by mouth as well.  Weight is up ~2 lbs in past 3 weeks. She weighs ~58 lbs today.   Pain is well-controlled with current oral pain medication regimen.     REVIEW OF SYSTEMS:  Review of Systems  Constitutional: Positive for fatigue.  HENT:  Negative.   Eyes: Negative.   Respiratory: Negative.   Cardiovascular: Negative.   Gastrointestinal: Negative.   Endocrine: Negative.   Genitourinary: Positive for bladder incontinence, dysuria and frequency.   Musculoskeletal: Positive for arthralgias.  Neurological: Positive for numbness.  Hematological: Negative.      PAST MEDICAL/SURGICAL HISTORY:  Past Medical History:  Diagnosis Date  . Anxiety   . Cervical cancer ([place])   . Cervical cancer, FIGO stage IIB ([place]) 04/09/2014  . Family history of adverse reaction to anesthesia    pt states her sister has to have the "reversal" after surgery   . Hypokalemia   . Microcytic hypochromic anemia   .  Recurrent cervical cancer ([place]) 12/12/2015   Past Surgical History:  Procedure Laterality Date  . CESAREAN SECTION    . IR GASTROSTOMY TUBE MOD SED  06/22/2016  . port a cath placement      January 2016 / right   . TANDEM RING INSERTION N/A 04/09/2014   Procedure: TANDEM RING PLACEMENT FOR HIGH DOSE RATE RADIATION THERAPY EXAM UNDER ANETHESIA PLACEMENT OF CERVICAL SLEEVE;  Surgeon: Gery Pray, MD;  Location: [facility];  Service: Urology;  Laterality: N/A;  . TANDEM RING INSERTION N/A 04/23/2014   Procedure: TANDEM RING PLACEMENT ;  Surgeon: Gery Pray, MD;  Location: [facility];  Service: Urology;  Laterality: N/A;  . TANDEM RING INSERTION N/A 04/30/2014   Procedure: TANDEM RING PLACEMENT,EXAM UNDER ANESTHESIA ;  Surgeon: Gery Pray, MD;  Location: [facility];  Service: Urology;  Laterality: N/A;  . TANDEM RING INSERTION N/A 05/07/2014   Procedure: TANDEM RING PLACEMENT;  Surgeon: Gery Pray, MD;  Location: [facility];  Service: Urology;  Laterality: N/A;  . TANDEM RING INSERTION N/A 05/17/2014    Procedure: TANDEM RING INSERTION;  Surgeon: Gery Pray, MD;  Location: [facility];  Service: Urology;  Laterality: N/A;  . TUBAL LIGATION       SOCIAL HISTORY:  Social History   Social History  . Marital status: Single    Spouse name: N/A  . Number of children: 2  . Years of education: N/A   Occupational History  . cook at [employer] History Main Topics  . Smoking status: Former Smoker    Packs/day: 0.50    Years: 6.00    Types: Cigarettes    Quit date: 08/02/2011  . Smokeless tobacco: Never Used  . Alcohol use No  . Drug use: Yes    Types: Marijuana  . Sexual activity: Yes   Other Topics Concern  . Not on file   Social History Narrative  . No narrative on file    FAMILY HISTORY:  Family History  Problem Relation Age of Onset  . Asthma Mother   . Heart attack Father     CURRENT MEDICATIONS:  Outpatient Encounter Prescriptions as of 08/25/2016  Medication Sig Note  . calcium-vitamin D (OSCAL 500/200 D-3) 500-200 MG-UNIT per tablet Take 1 tablet by mouth 2 (two) times daily. 07/09/2016: Twice a week  . estrogen, conjugated,-medroxyprogesterone (PREMPRO) 0.3-1.5 MG per tablet Take 1 tablet by mouth daily. (Patient taking differently: Take 1 tablet by mouth See admin instructions. Take 1 tablet by mouth daily for 2 weeks then stop for 2 weeks)   . ferrous sulfate 325 (65 FE) MG tablet Take 325 mg by mouth daily with breakfast. 07/09/2016: Takes twice a week  . HYDROcodone-acetaminophen (NORCO) 7.5-325 MG tablet Take 1 tablet by mouth every 4 (four) hours as needed for moderate pain.   Marland Kitchen lidocaine-prilocaine (EMLA) cream Apply to affected area once   . LORazepam (ATIVAN) 1 MG tablet TAKE 1 TABLET BY MOUTH AT BEDTIME AS NEEDED FOR ANXIETY /INSOMNIA   . naproxen sodium (ANAPROX) 220 MG tablet Take 220 mg by mouth 4 (four) times daily as needed (for pain).    . Nutritional Supplements (FEEDING SUPPLEMENT, OSMOLITE 1.5 CAL,) LIQD Give 1 can 3 times per  day.  Flush with 1ml of water before and after feeding 3 times per day.   . ondansetron (ZOFRAN) 8 MG tablet Take 1 tablet (8 mg total) by mouth 2 (two) times daily as needed for refractory nausea / vomiting.  Start on day 3 after chemo.   . pantoprazole (PROTONIX) 40 MG tablet Take 1 tablet (40 mg total) by mouth daily.   . potassium phosphate, monobasic, (K-PHOS ORIGINAL) 500 MG tablet Take 1 tablet (500 mg total) by mouth 3 (three) times daily with meals.   . predniSONE (DELTASONE) 20 MG tablet Take 1 tablet (20 mg total) by mouth daily with breakfast.   . prochlorperazine (COMPAZINE) 10 MG tablet Take 1 tablet (10 mg total) by mouth every 6 (six) hours as needed (Nausea or vomiting).   . [DISCONTINUED] HYDROcodone-acetaminophen (NORCO) 7.5-325 MG tablet Take 1 tablet by mouth every 4 (four) hours as needed for moderate pain.   . [DISCONTINUED] sulfamethoxazole-trimethoprim (BACTRIM DS,SEPTRA DS) 800-160 MG tablet Take 1 tablet by mouth 2 (two) times daily.   . ciprofloxacin (CIPRO) 250 MG tablet Take 1 tablet (250 mg total) by mouth 2 (two) times daily. X 7 days. Then take 1 tab daily thereafter    No facility-administered encounter medications on file as of 08/25/2016.     ALLERGIES:  No Known Allergies   PHYSICAL EXAM:  ECOG Performance status: 2 - 3 - Symptomatic; requires occasional assistance.    Vitals:   08/25/16 0959  BP: (!) 93/58  Pulse: (!) 120  Resp: 16   Filed Weights   08/25/16 0959  Weight: 58 lb 6.4 oz (26.5 kg)      Physical Exam  Constitutional: She is oriented to person, place, and time.  Cachectic, emaciated female in no acute distress.  Seen and examined in wheelchair today.   HENT:  Head: Normocephalic.  Mouth/Throat: Oropharynx is clear and moist. No oropharyngeal exudate.  Eyes: Pupils are equal, round, and reactive to light. Conjunctivae are normal. No scleral icterus.  Neck: Normal range of motion. Neck supple.  Cardiovascular: Regular rhythm.     Tachycardic   Pulmonary/Chest: Effort normal.  Diminished breath sounds bilat bases   Abdominal: Soft. There is no tenderness.  Hypoactive bowel sounds  Musculoskeletal: She exhibits no edema.  Lymphadenopathy:    She has no cervical adenopathy.  Neurological: She is alert and oriented to person, place, and time.  Skin: Skin is warm.  Psychiatric:  Slightly agitated. "Can we leave now because you're making my baby cry?"   Nursing note and vitals reviewed.    LABORATORY DATA:  I have reviewed the labs as listed.  CBC    Component Value Date/Time   WBC 9.6 07/20/2016 0925   RBC 2.36 (L) 07/20/2016 0925   HGB 7.5 (L) 07/20/2016 0925   HGB 12.0 12/12/2015 0920   HCT 23.4 (L) 07/20/2016 0925   HCT 37.6 12/12/2015 0920   PLT 460 (H) 07/20/2016 0925   PLT 289 12/12/2015 0920   MCV 99.2 07/20/2016 0925   MCV 89.1 12/12/2015 0920   MCH 31.8 07/20/2016 0925   MCHC 32.1 07/20/2016 0925   RDW 19.0 (H) 07/20/2016 0925   RDW 15.5 (H) 12/12/2015 0920   LYMPHSABS 1.1 07/20/2016 0925   LYMPHSABS 1.0 12/12/2015 0920   MONOABS 0.8 07/20/2016 0925   MONOABS 0.3 12/12/2015 0920   EOSABS 0.0 07/20/2016 0925   EOSABS 0.0 12/12/2015 0920   BASOSABS 0.0 07/20/2016 0925   BASOSABS 0.0 12/12/2015 0920   CMP Latest Ref Rng & Units 07/20/2016 07/09/2016 07/02/2016  Glucose 65 - 99 mg/dL 99 111(H) 122(H)  BUN 6 - 20 mg/dL 12 12 7   Creatinine 0.44 - 1.00 mg/dL 0.70 0.69 0.40(L)  Sodium 135 - 145 mmol/L 132(L) 133(L)  137  Potassium 3.5 - 5.1 mmol/L 3.9 3.1(L) 5.5(H)  Chloride 101 - 111 mmol/L 97(L) 94(L) 99(L)  CO2 22 - 32 mmol/L 27 28 30   Calcium 8.9 - 10.3 mg/dL 8.4(L) 8.6(L) 9.1  Total Protein 6.5 - 8.1 g/dL 6.7 7.1 7.0  Total Bilirubin 0.3 - 1.2 mg/dL 0.4 0.5 0.6  Alkaline Phos 38 - 126 U/L 121 147(H) 141(H)  AST 15 - 41 U/L 14(L) 17 16  ALT 14 - 54 U/L 11(L) 11(L) 10(L)    Results for STEVIE, ERTLE (MRN [id number])   Ref. Range 07/20/2016 09:25  Phosphorus Latest Ref Range: 2.5 - 4.6  mg/dL 2.5  Magnesium Latest Ref Range: 1.7 - 2.4 mg/dL 1.7      PENDING LABS:     DIAGNOSTIC IMAGING:  *The following radiologic images and reports have been reviewed independently and agree with below findings.  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 Most recent restaging PET scan 05/24/16 revealed continued reduction in size and metabolism of right liver mets; no new/progressive metastatic disease. 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  Patient saw Dr. Denman George with Gyn-Onc on 05/24/16; she recommended no additional chemotherapy at this time given patient's poor tolerance and weight loss. Subsequent PET scan completed 08/03/16 and unfortunately revealed recurrent/progressive metastatic disease in the liver and large central pelvic mass.  -Discussed option of recently FDA-approved immunotherapy for recurrent advanced cervical cancer with Keytruda.  She is not interested in pursuing any additional therapy stating, "I think it will just make me feel worse." Shared with her that while the side effects are different than traditional chemotherapy, I too am concerned about how well she would tolerate immunotherapy given her cachexia and declining performance status (seen in wheelchair today). If she has immunotherapy adverse side effects, unfortunately she does not have the reserve to recover well from them.   -We had goals of care discussion today (see below).  We will remain "on standby" for symptom management and supportive care as needed.  We will not schedule any further routine follow-up visits. Encouraged her to call us if/when she is ready for hospice or home health referral.    Goals of care discussion:  -We spent some time reviewing her recent PET scan and  current symptoms.  She is very clear that she does not want any additional surgeries or treatment, including immunotherapy.  This is certainly reasonable and her family is supportive of this decision as well.  We discussed the option of initiating hospice; briefly shared with them the benefits of hospice and services they provide, including home hospice & inpatient hospice. Explained that hospice not only cares for the patient, but provides respite and support for the entire family as well. Erika [name] initially states that she wouldn't mind having hospice at home, but with further discussion with her significant other, they elect to "wait one more month."  Shared with them that our hope, as well as hospice's mission is to help her have more good days than bad by optimizing her symptoms. Erika [name] and her family think they can do this themselves for now, which is reasonable.  She is giving herself this 21-month deadline to see if she improves any "on my own."  Discussed with her the option of home health nursing support instead of hospice. She again declines for now.  -She certainly meets criteria for hospice with life expectancy < 6 months. I suspect she has <3 month life expectancy.  Encouraged her to call us when she is ready for hospice and we would be happy to initiate this referral for her.  We will not make any additional routine follow-up appointments here at the cancer center, but will remain available to her as needed.  She agreed with this plan.   Pain:  -Secondary to malignancy. -Norco refilled today after reviewed [facility] controlled substance [facility].     Recurrent UTI/urinary symptoms:  -Likely secondary to vesicovaginal and rectovaginal fistulas.  Will collect UA and culture/sensitivity.  -States that the Bactrim DS she took for last UTI "really burned my belly." She is requesting something "not so strong."  Cautioned her that nearly all antibiotics can cause GI distress.  E-scribed Cipro 250 mg  (lower dose given low body weight) BID x 7 days. After she completes BID dosing, then recommended she taking Cipro 250 mg once daily as prophylactic dose thereafter. She agreed with this plan.   Addendum:  -Received a call from the lab that they were not going to be able to run the urinalysis as there was stool in the urine sample. Cancelled UA and urine culture orders and will continue to treat empirically.            Dispo:  -No additional follow-up to be scheduled at this time.  Patient/family will contact us as needed and for hospice referral when they are ready.    All questions were answered to patient's stated satisfaction. Encouraged patient to call with any new concerns or questions before her next visit to the cancer center and we can certain see her sooner, if needed.    Plan of care discussed with Dr. Talbert Cage, who agrees with the above aforementioned.     Orders placed this encounter:  Orders Placed This Encounter  Procedures  . Urine culture  . Urinalysis, Routine w reflex microscopic      Mike Craze, NP [place] [phone]

## 2016-08-25 NOTE — Patient Instructions (Addendum)
[place] at [facility] Discharge Instructions  RECOMMENDATIONS MADE BY THE CONSULTANT AND ANY TEST RESULTS WILL BE SENT TO YOUR REFERRING PHYSICIAN.  You were seen today by Mike Craze NP. Antibiotic sent to your pharmacy, take 1 tablet twice a day for 7 days and then once a day everyday after that. Call us if you need anything.    Thank you for choosing [place] at [facility] to provide your oncology and hematology care.  To afford each patient quality time with our provider, please arrive at least 15 minutes before your scheduled appointment time.    If you have a lab appointment with the [facility] please come in thru the  Main Entrance and check in at the main information desk  You need to re-schedule your appointment should you arrive 10 or more minutes late.  We strive to give you quality time with our providers, and arriving late affects you and other patients whose appointments are after yours.  Also, if you no show three or more times for appointments you may be dismissed from the clinic at the providers discretion.     Again, thank you for choosing [facility].  Our hope is that these requests will decrease the amount of time that you wait before being seen by our physicians.       _____________________________________________________________  Should you have questions after your visit to [facility], please contact our office at [phone] between the hours of 8:30 a.m. and 4:30 p.m.  Voicemails left after 4:30 p.m. will not be returned until the following business day.  For prescription refill requests, have your pharmacy contact our office.       Resources For Cancer Patients and their Caregivers ? American Cancer Society: Can assist with transportation, wigs, general needs, runs Look Good Feel Better.        615 400 6485 ? Cancer Care: Provides financial assistance, online support  groups, medication/co-pay assistance.  1-800-813-HOPE (402)338-3671) ? [place] Assists [place] cancer patients and their families through emotional , educational and financial support.  272-630-2147 ? [place] DSS Where to apply for food stamps, Medicaid and utility assistance. (386)537-7255 ? [facility]: Transportation to medical appointments. 816-258-1175 ? Social Security Administration: May apply for disability if have a Stage IV cancer. 817-720-4068 5876446366 ? [employer], Disability and Transit Services: Assists with nutrition, care and transit needs. [place] Support Programs: @10RELATIVEDAYS @ > Cancer Support Group  2nd Tuesday of the month 1pm-2pm, Journey Room  > Creative Journey  3rd Tuesday of the month 1130am-1pm, Journey Room  > Look Good Feel Better  1st Wednesday of the month 10am-12 noon, Journey Room (Call [place] to register [phone])

## 2016-08-30 ENCOUNTER — Telehealth (HOSPITAL_COMMUNITY): Payer: Self-pay | Admitting: *Deleted

## 2016-09-01 ENCOUNTER — Other Ambulatory Visit (HOSPITAL_COMMUNITY): Payer: PRIVATE HEALTH INSURANCE

## 2016-09-01 ENCOUNTER — Ambulatory Visit (HOSPITAL_COMMUNITY): Payer: PRIVATE HEALTH INSURANCE | Admitting: Adult Health

## 2016-12-02 DEATH — deceased

## 2017-02-09 IMAGING — US US BIOPSY
1 series · 13 of 19 positions shown · non-contrast
Comparison: none

INDICATION: 40-year-old female with a history of gynecologic malignancy and a
PET avid lesion in the liver.

[Series 1: us biopsy · 0.25mm/px · 13 of 19 slices shown]
[im 1/19]
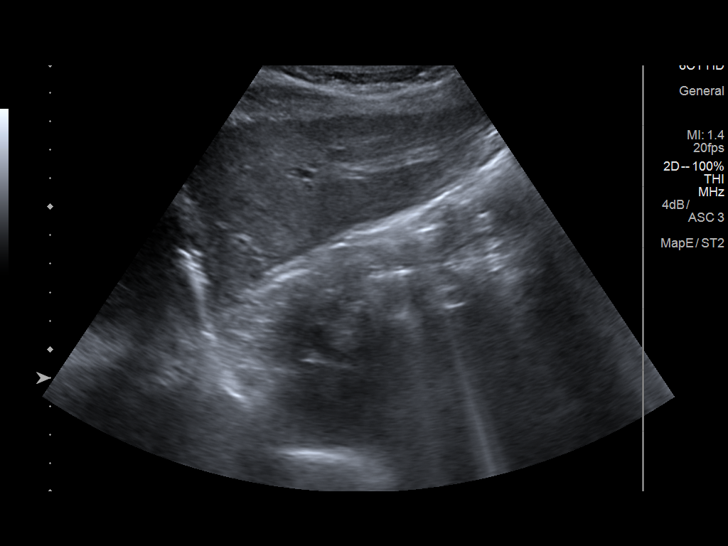
[im 3/19]
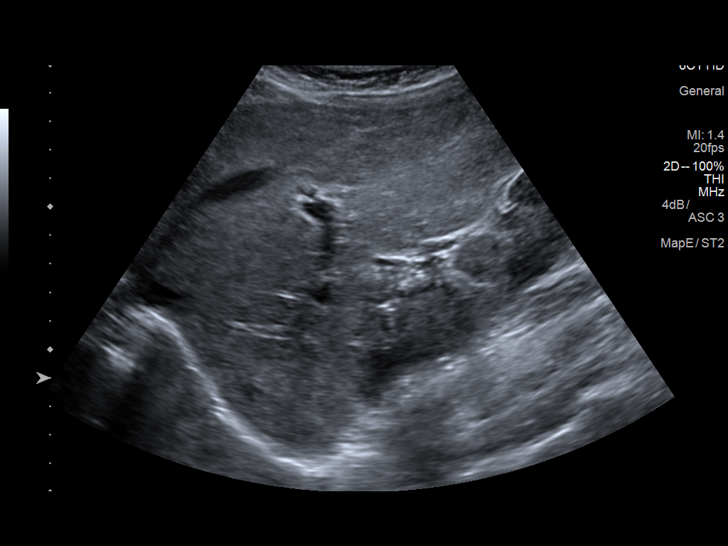
[im 4/19]
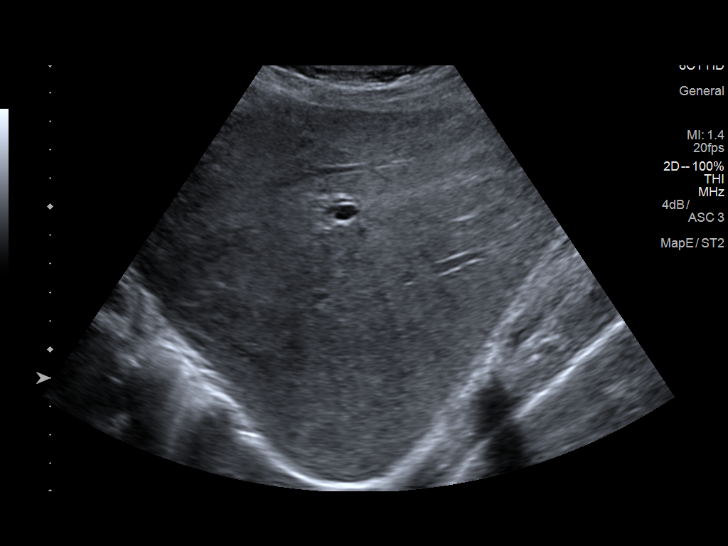
[im 6/19]
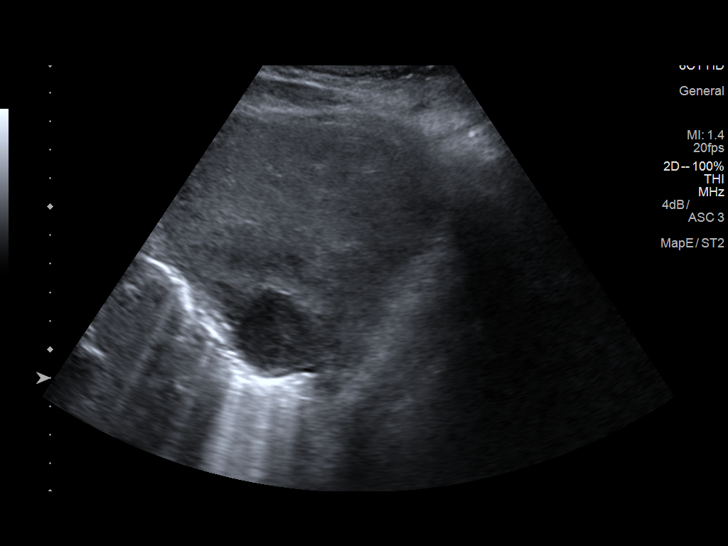
[im 7/19]
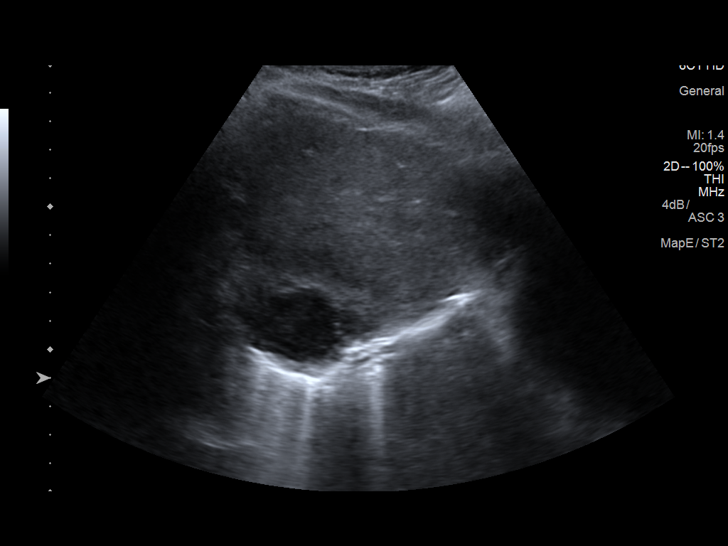
[im 9/19]
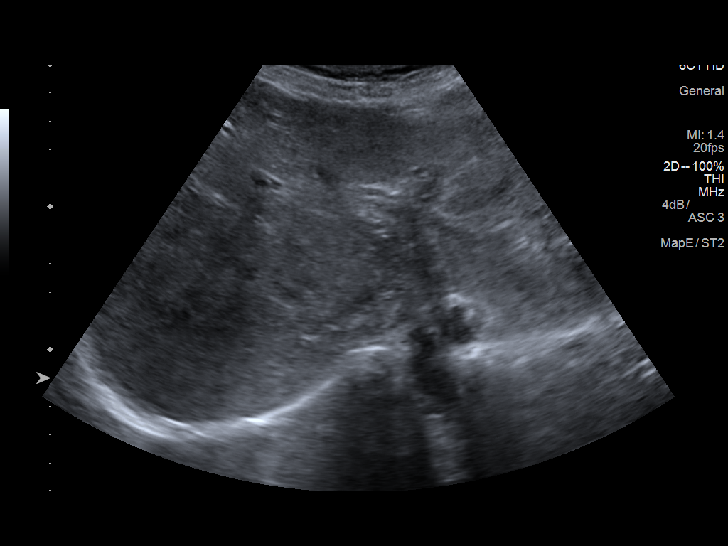
[im 10/19]
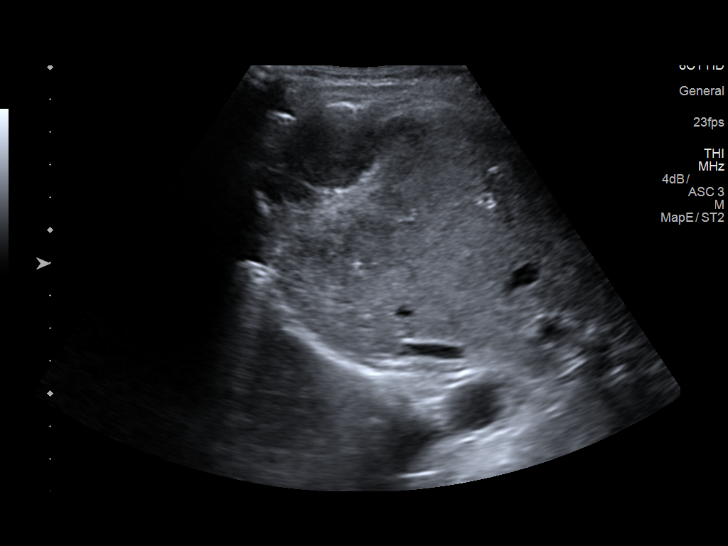
[im 11/19]
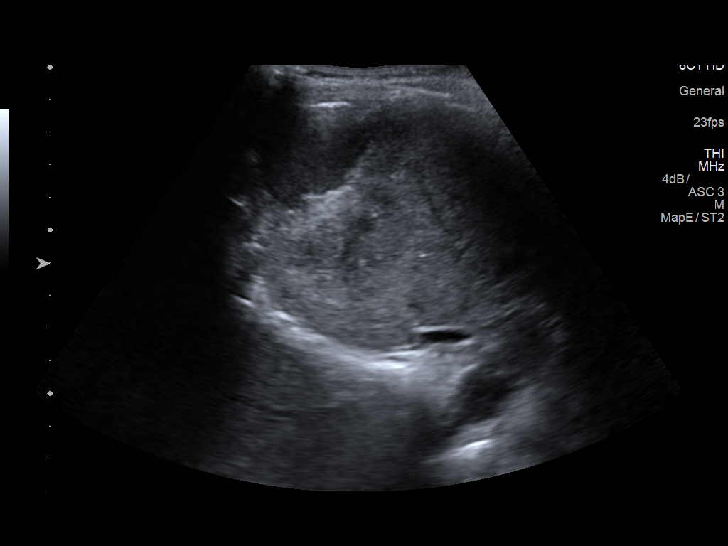
[im 13/19]
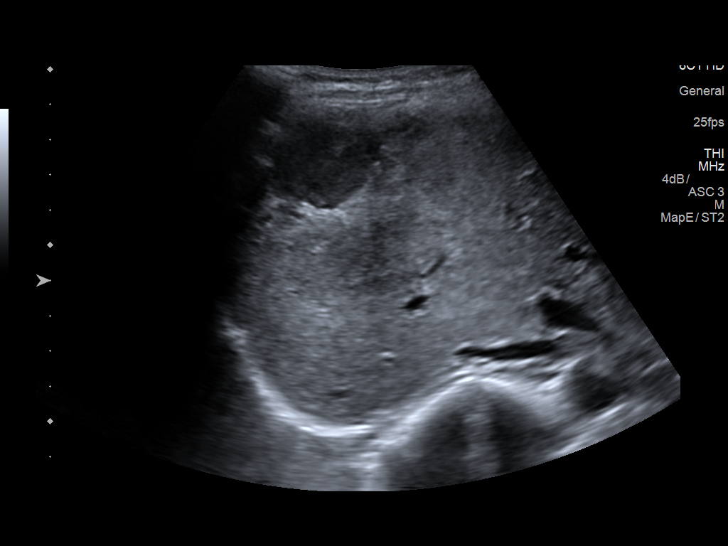
[im 14/19]
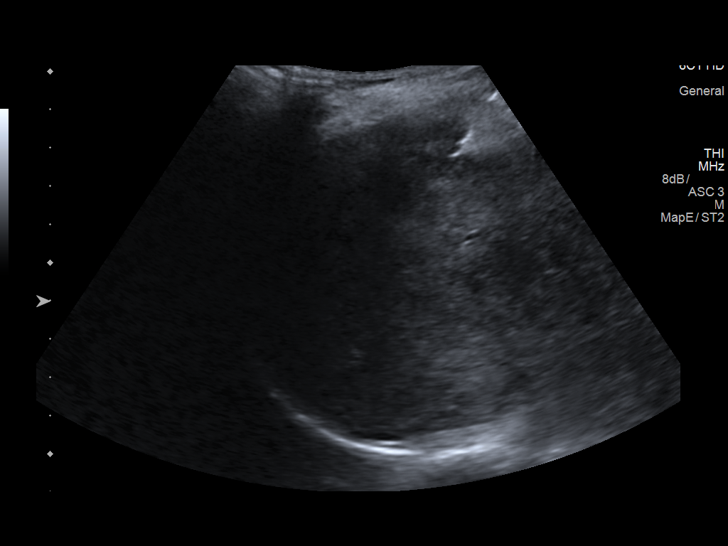
[im 16/19]
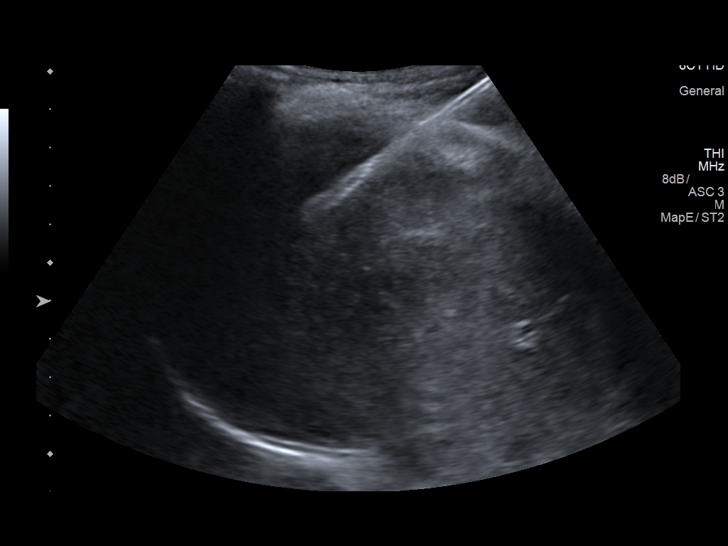
[im 17/19]
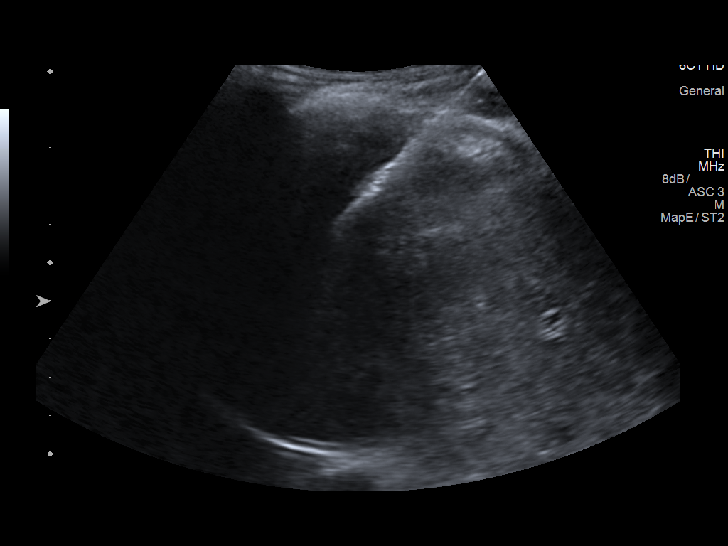
[im 19/19]
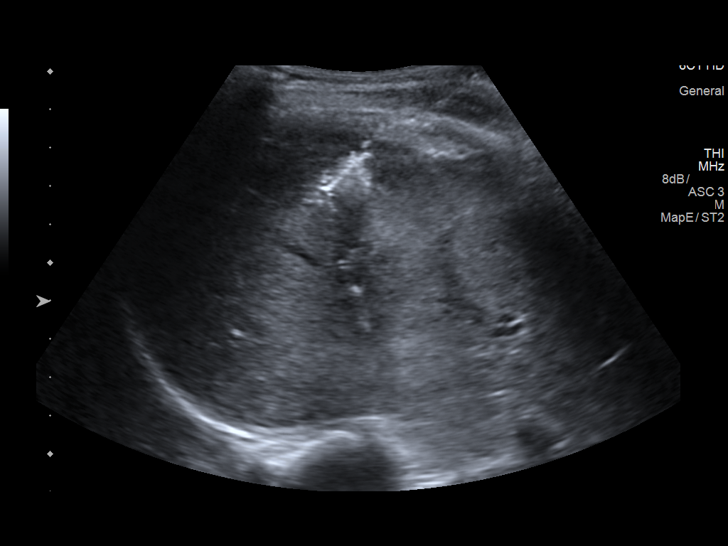

[13 of 19 positions shown; findings below may reference images not displayed]

EXAM:
ULTRASOUND BIOPSY CORE LIVER

MEDICATIONS:
None.

ANESTHESIA/SEDATION:
Moderate (conscious) sedation was employed during this procedure. A
total of Versed 2.0 mg and Fentanyl 50 mcg was administered
intravenously.

Moderate Sedation Time: 13 minutes. The patient's level of
consciousness and vital signs were monitored continuously by
radiology nursing throughout the procedure under my direct
supervision.

FLUOROSCOPY TIME:  None

COMPLICATIONS:
None

PROCEDURE:
Informed written consent was obtained from the patient after a
thorough discussion of the procedural risks, benefits and
alternatives. All questions were addressed. Maximal Sterile Barrier
Technique was utilized including caps, mask, sterile gowns, sterile
gloves, sterile drape, hand hygiene and skin antiseptic. A timeout
was performed prior to the initiation of the procedure.

Patient positioned supine position on the ultrasound table.
Ultrasound survey of the upper abdomen was performed with images
stored and sent to PACs.

The patient is an prepped and draped in usual sterile fashion. The
skin and subcutaneous tissues were generously infiltrated 1%
lidocaine for local anesthesia.

Using ultrasound guidance, 17 gauge guide needle was advanced into
the liver mass of the right liver. Stylet was removed and multiple
18 gauge core biopsy were acquired. Single Gel-Foam pledget was
infused.

Patient tolerated the procedure well and remained hemodynamically
stable throughout.

No complications were encountered and no significant blood loss
encountered.
IMPRESSION: Status post ultrasound-guided biopsy of right liver mass with tissue
specimen sent to pathology for complete histopathologic analysis.

## 2017-02-19 ENCOUNTER — Other Ambulatory Visit: Payer: Self-pay | Admitting: Nurse Practitioner

## 2018-09-08 IMAGING — DX DG ABDOMEN 2V
2 series · 2 of 2 positions shown · non-contrast
Comparison: None.

CLINICAL DATA: Low abdomen pain and pressure for several days,
currently the patient is on chemotherapy for cervical carcinoma

EXAM:
ABDOMEN - 2 VIEW

[abdomen erect]
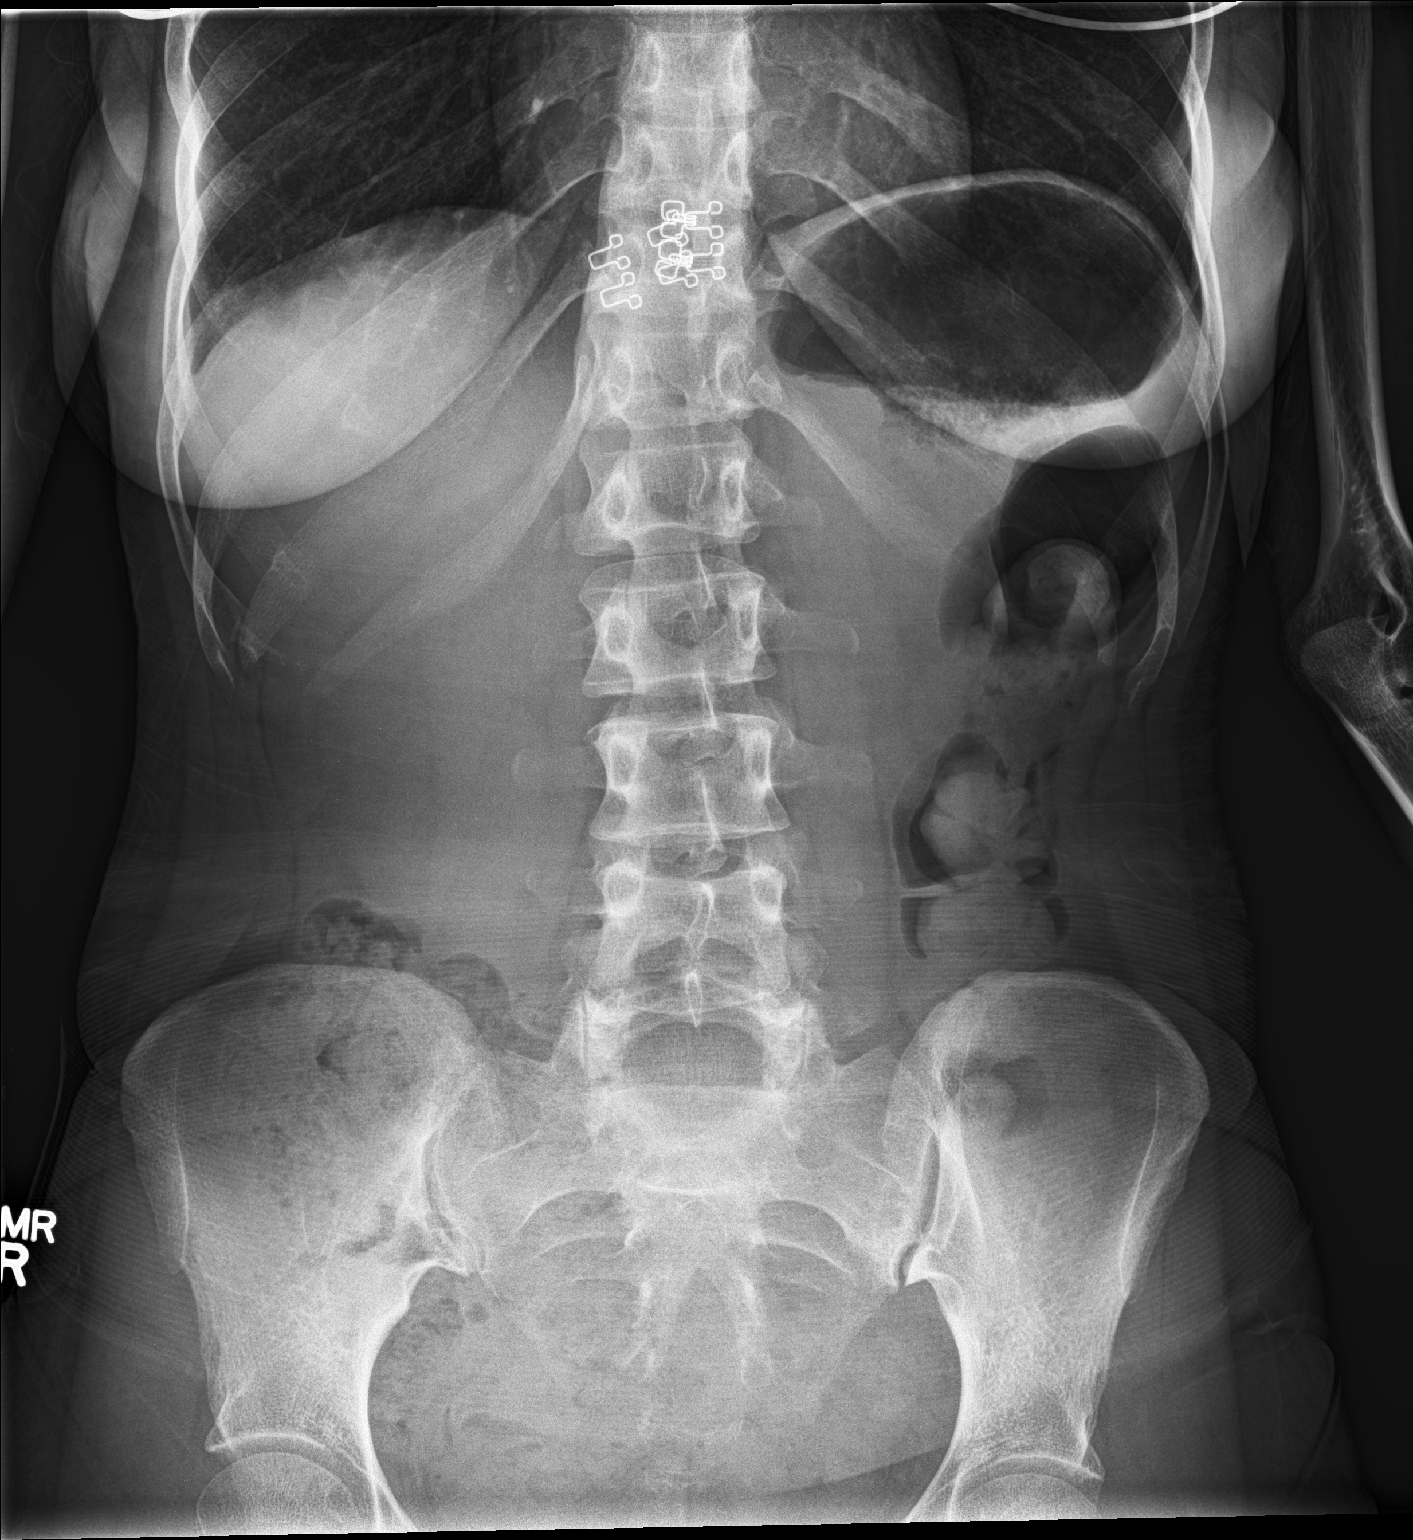

[abdomen supine]
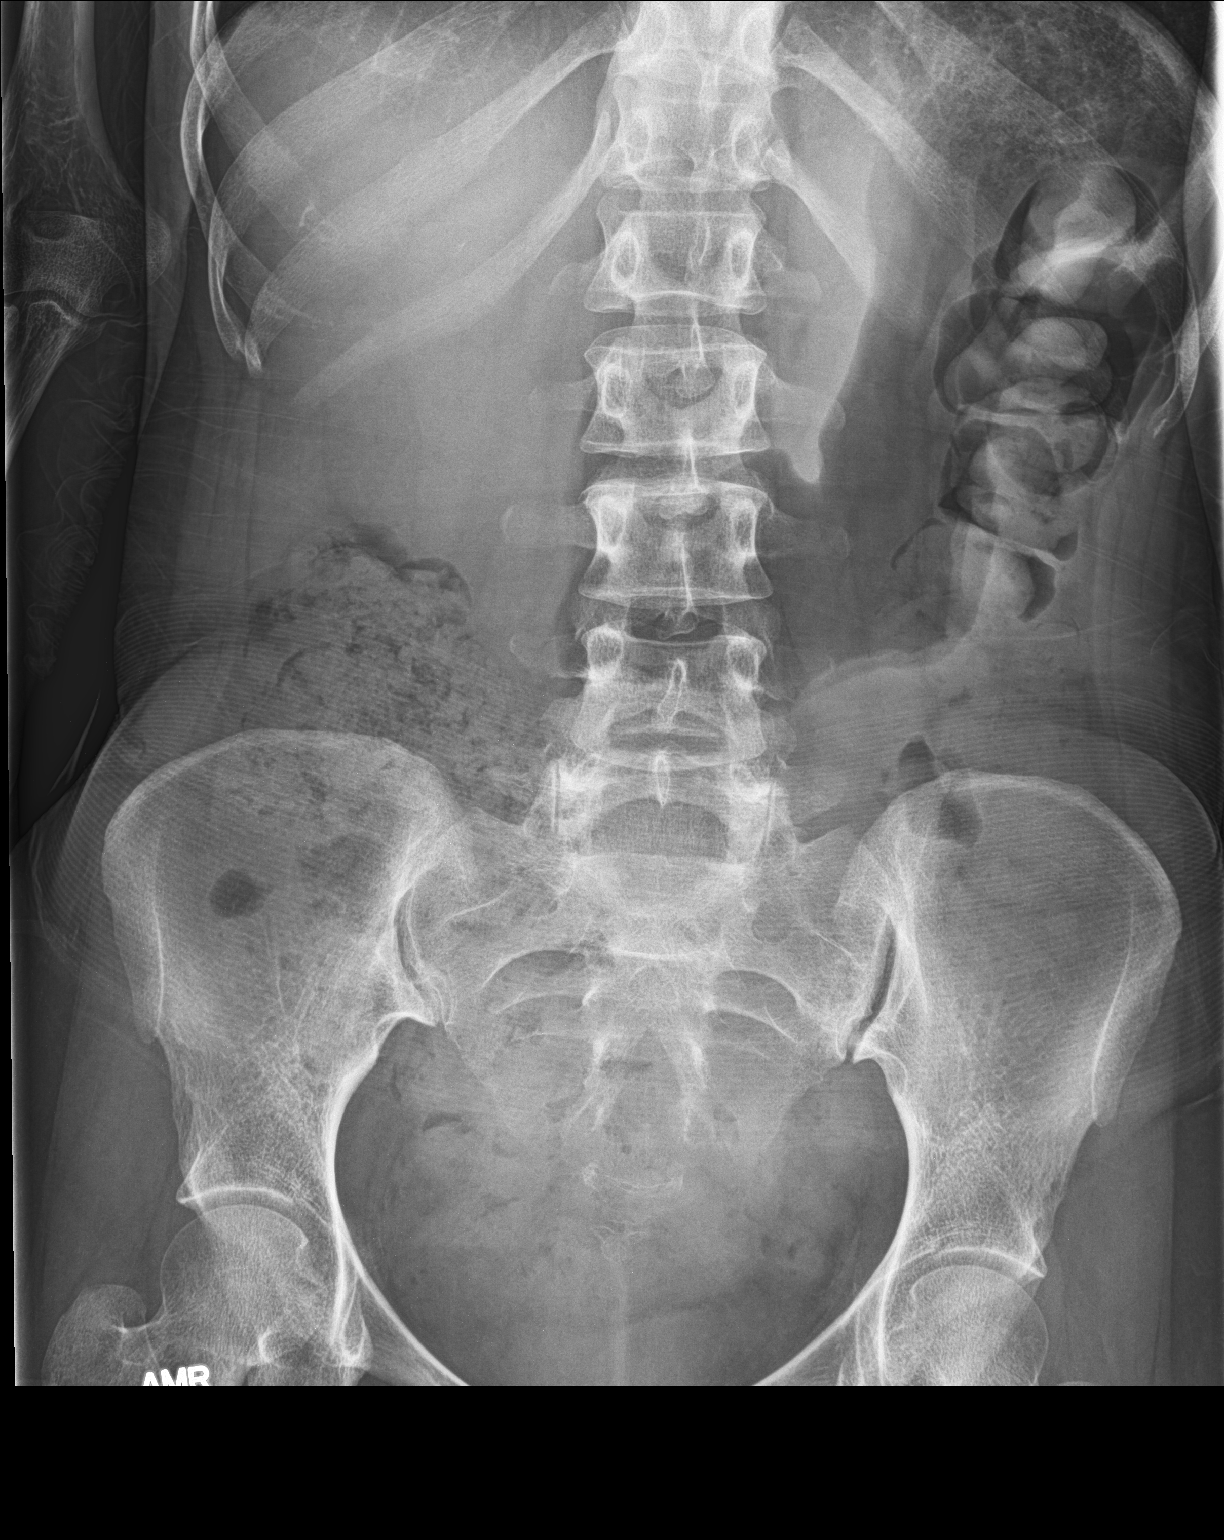

[2 of 2 positions shown; findings below may reference images not displayed]

FINDINGS: There is a moderate to large amount of feces throughout the colon.
No bowel obstruction is seen. No free air is noted on the erect
view. No opaque calculi are noted. The bones are unremarkable.
IMPRESSION: 1. Moderately large amount of feces throughout the colon.
2. No bowel obstruction or free air .

## 2019-02-10 IMAGING — DX DG ABDOMEN 1V
1 series · 1 of 1 positions shown · non-contrast
Comparison: Head CT May 24, 2016

CLINICAL DATA: Cervical carcinoma.  Leukocytosis

EXAM:
ABDOMEN - 1 VIEW

[abdomen kub]
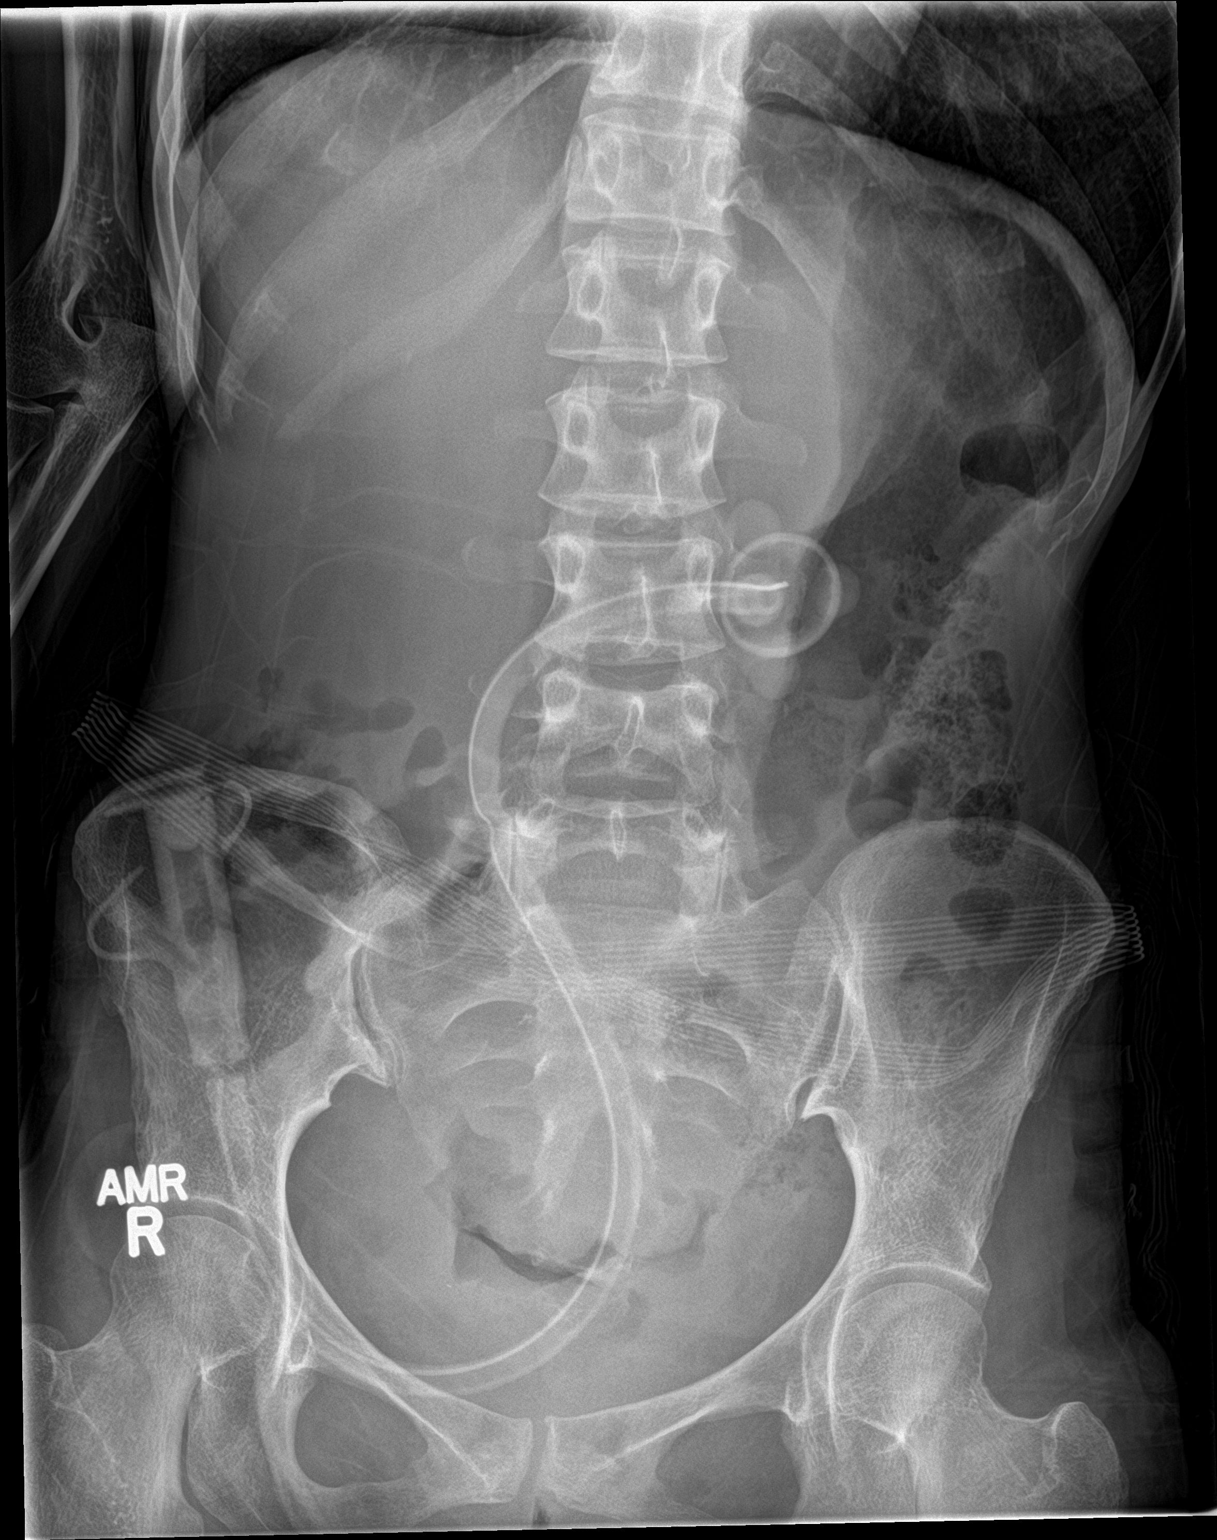

[1 of 1 positions shown; findings below may reference images not displayed]

FINDINGS: Gastrostomy catheter is positioned in the stomach region. There is
no bowel dilatation or air-fluid level to suggest bowel obstruction.
No free air. No abnormal calcifications. No bone lesions.
IMPRESSION: Gastrostomy catheter in region of stomach. No bowel obstruction or
free air evident.

## 2019-02-10 IMAGING — DX DG CHEST 2V
2 series · 2 of 2 positions shown · non-contrast
Comparison: December 16, 2015

CLINICAL DATA: Cervical carcinoma.  Leukocytosis

EXAM:
CHEST  2 VIEW

[chest pa]
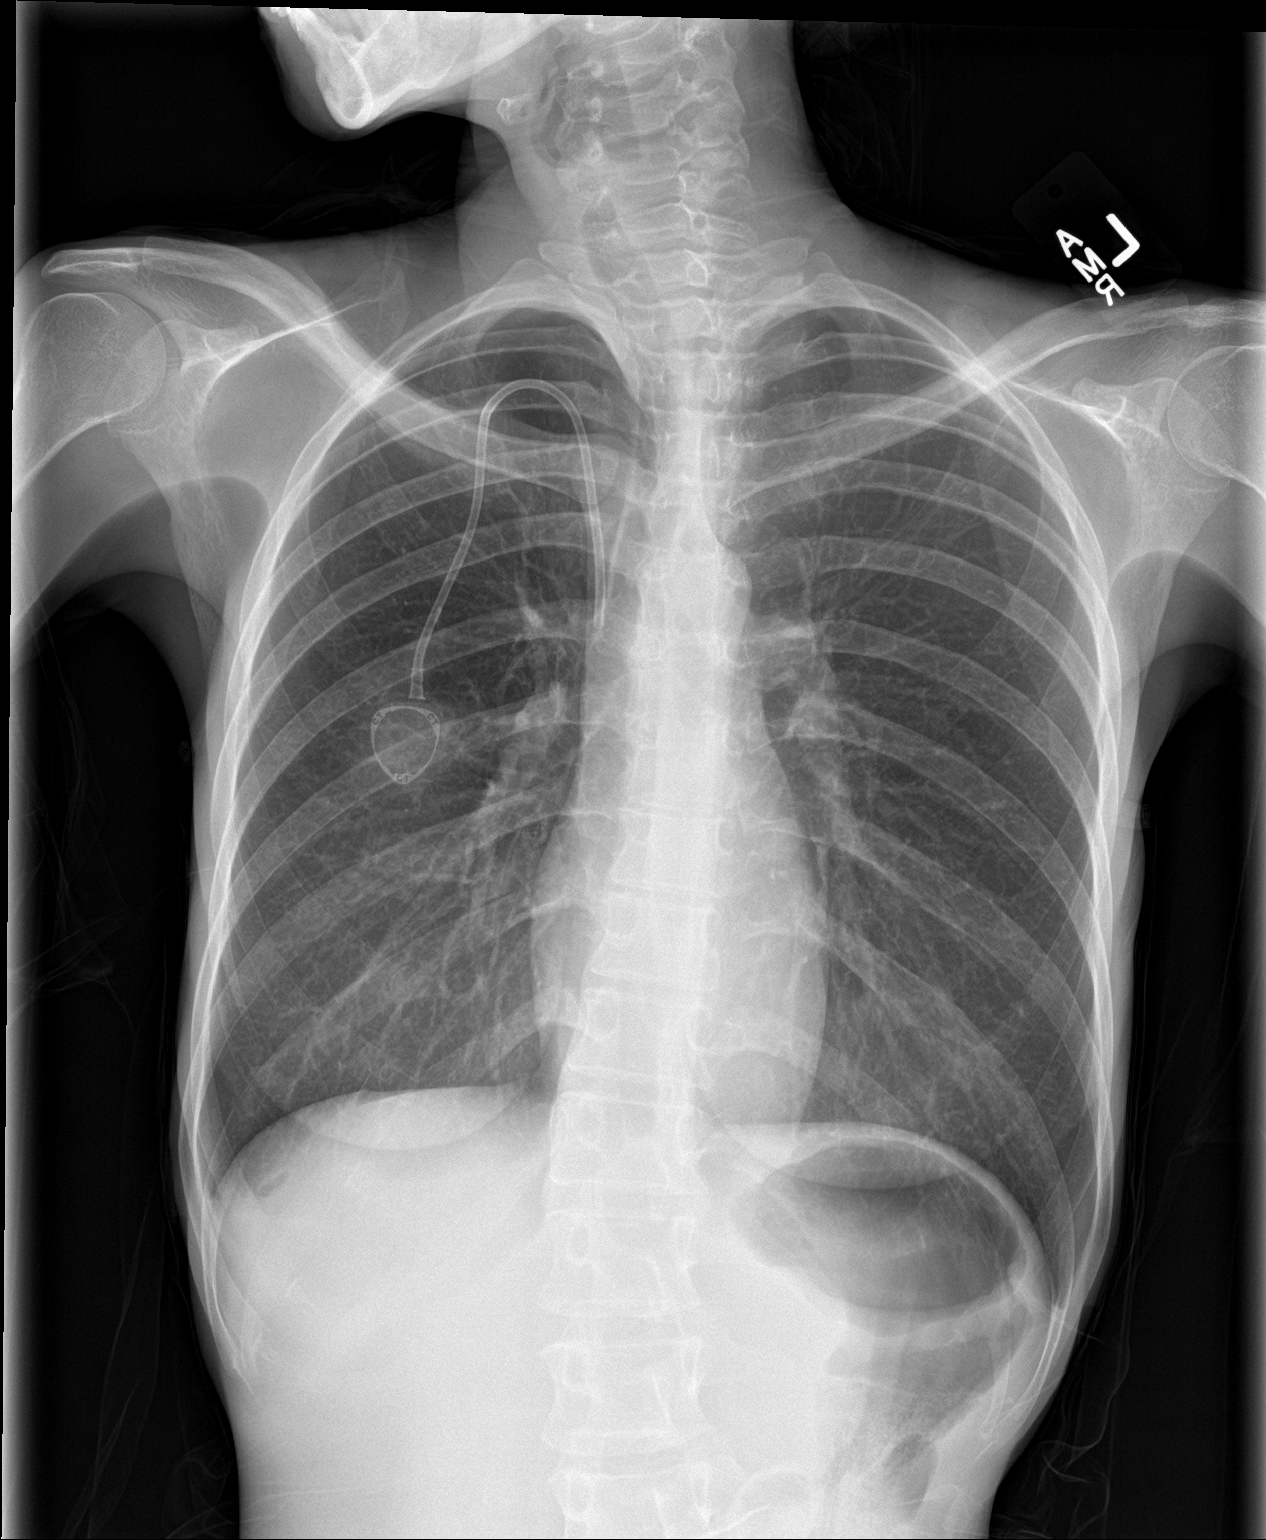

[chest lat]
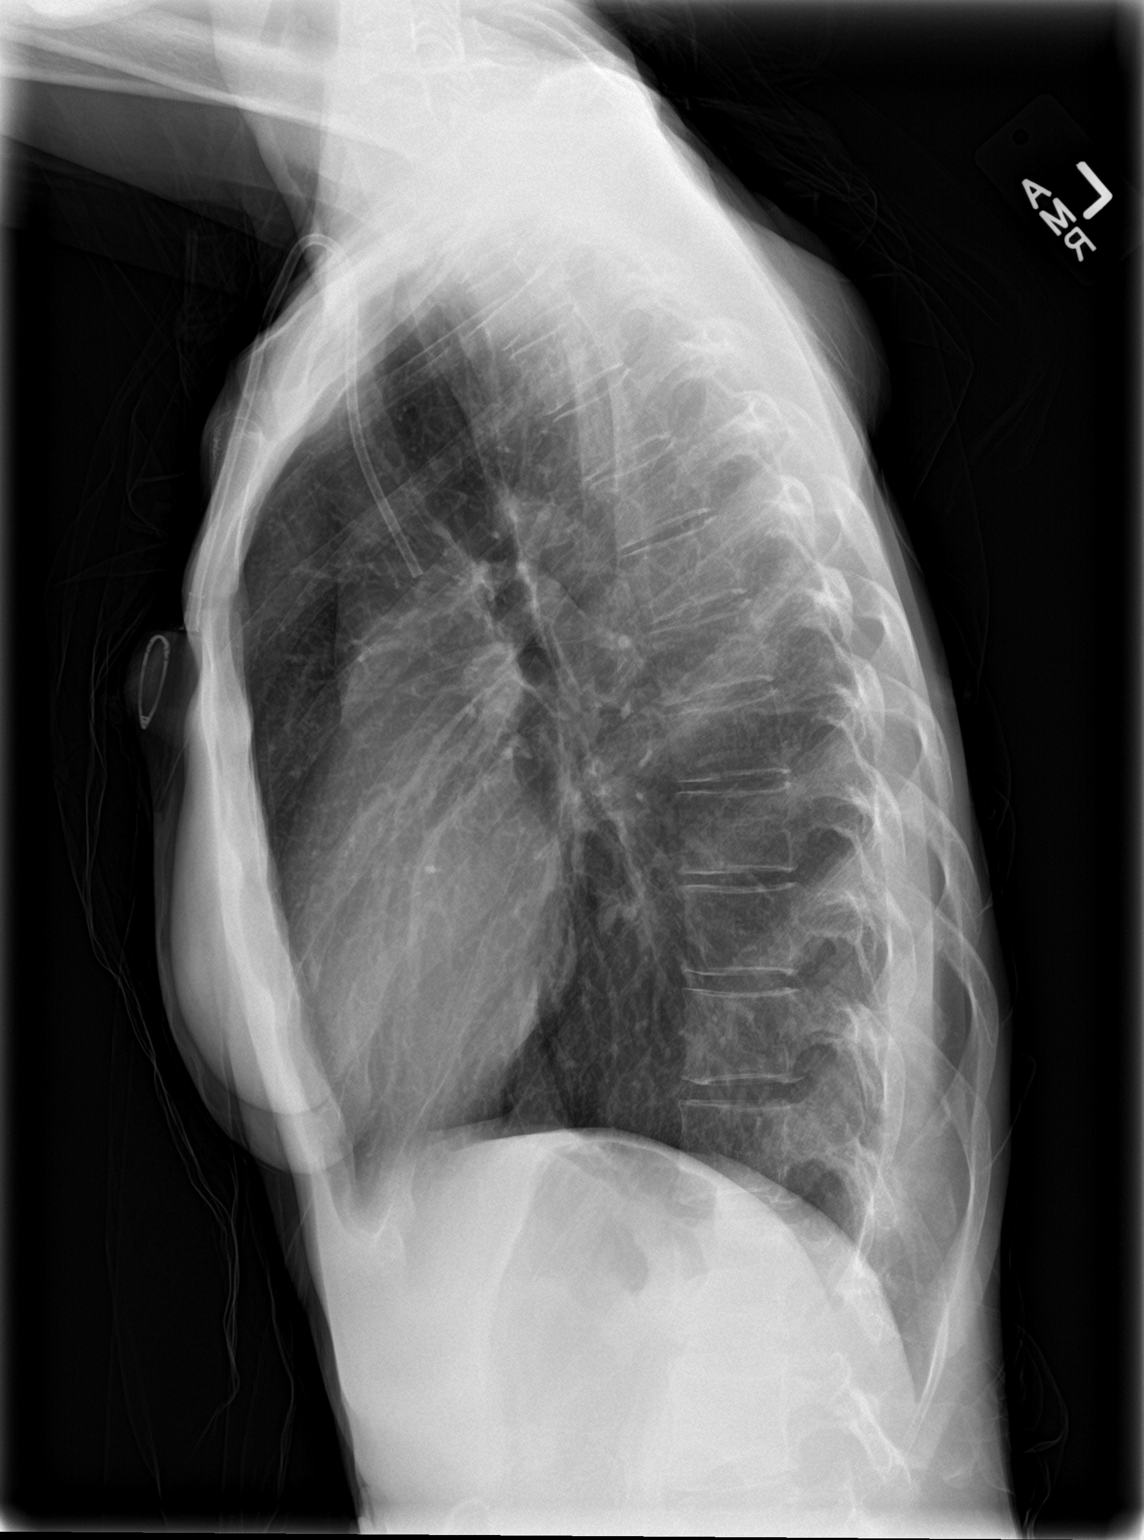

[2 of 2 positions shown; findings below may reference images not displayed]

FINDINGS: Port-A-Cath tip is in the superior vena cava. No pneumothorax. Lungs
clear. Heart size and pulmonary vascularity are normal. No
adenopathy. No bone lesions.
IMPRESSION: No edema or consolidation.  No adenopathy evident.
